# Patient Record
Sex: Female | Born: 1935 | Race: White | Hispanic: No | Smoking: Never smoker
Health system: Southern US, Community
[De-identification: ages and names within clinical notes are randomized; demographics above are authoritative.]

## PROBLEM LIST (undated history)

## (undated) DIAGNOSIS — F411 Generalized anxiety disorder: Secondary | ICD-10-CM

## (undated) DIAGNOSIS — R32 Unspecified urinary incontinence: Principal | ICD-10-CM

## (undated) DIAGNOSIS — F0280 Dementia in other diseases classified elsewhere without behavioral disturbance: Secondary | ICD-10-CM

## (undated) DIAGNOSIS — I4891 Unspecified atrial fibrillation: Secondary | ICD-10-CM

## (undated) DIAGNOSIS — M1990 Unspecified osteoarthritis, unspecified site: Secondary | ICD-10-CM

## (undated) DIAGNOSIS — M419 Scoliosis, unspecified: Secondary | ICD-10-CM

## (undated) DIAGNOSIS — I10 Essential (primary) hypertension: Secondary | ICD-10-CM

## (undated) DIAGNOSIS — K5900 Constipation, unspecified: Secondary | ICD-10-CM

## (undated) DIAGNOSIS — G20 Parkinson's disease: Secondary | ICD-10-CM

## (undated) DIAGNOSIS — G4700 Insomnia, unspecified: Secondary | ICD-10-CM

## (undated) DIAGNOSIS — E785 Hyperlipidemia, unspecified: Secondary | ICD-10-CM

## (undated) DIAGNOSIS — G20A1 Parkinson's disease without dyskinesia, without mention of fluctuations: Secondary | ICD-10-CM

## (undated) DIAGNOSIS — R413 Other amnesia: Secondary | ICD-10-CM

## (undated) DIAGNOSIS — S42309A Unspecified fracture of shaft of humerus, unspecified arm, initial encounter for closed fracture: Secondary | ICD-10-CM

## (undated) DIAGNOSIS — F419 Anxiety disorder, unspecified: Secondary | ICD-10-CM

## (undated) DIAGNOSIS — R5381 Other malaise: Secondary | ICD-10-CM

## (undated) DIAGNOSIS — S52599A Other fractures of lower end of unspecified radius, initial encounter for closed fracture: Secondary | ICD-10-CM

## (undated) DIAGNOSIS — Z8673 Personal history of transient ischemic attack (TIA), and cerebral infarction without residual deficits: Secondary | ICD-10-CM

## (undated) DIAGNOSIS — I359 Nonrheumatic aortic valve disorder, unspecified: Secondary | ICD-10-CM

## (undated) HISTORY — DX: Dementia in other diseases classified elsewhere without behavioral disturbance: F02.80

## (undated) HISTORY — DX: Anxiety disorder, unspecified: F41.9

## (undated) HISTORY — DX: Hyperlipidemia, unspecified: E78.5

## (undated) HISTORY — DX: Unspecified atrial fibrillation: I48.91

## (undated) HISTORY — DX: Insomnia, unspecified: G47.00

## (undated) HISTORY — DX: Unspecified osteoarthritis, unspecified site: M19.90

## (undated) HISTORY — PX: CHOLECYSTECTOMY: SHX55

## (undated) HISTORY — DX: Unspecified fracture of shaft of humerus, unspecified arm, initial encounter for closed fracture: S42.309A

## (undated) HISTORY — DX: Other malaise: R53.81

## (undated) HISTORY — DX: Nonrheumatic aortic valve disorder, unspecified: I35.9

## (undated) HISTORY — DX: Other amnesia: R41.3

## (undated) HISTORY — DX: Generalized anxiety disorder: F41.1

## (undated) HISTORY — DX: Constipation, unspecified: K59.00

## (undated) HISTORY — DX: Other fractures of lower end of unspecified radius, initial encounter for closed fracture: S52.599A

## (undated) HISTORY — DX: Personal history of transient ischemic attack (TIA), and cerebral infarction without residual deficits: Z86.73

## (undated) HISTORY — PX: BREAST SURGERY: SHX581

## (undated) HISTORY — DX: Unspecified urinary incontinence: R32

---

## 2008-08-08 ENCOUNTER — Emergency Department (HOSPITAL_COMMUNITY): Admission: EM | Admit: 2008-08-08 | Discharge: 2008-08-08 | Payer: Self-pay | Admitting: Emergency Medicine

## 2010-07-01 LAB — POCT I-STAT, CHEM 8
BUN: 9 mg/dL (ref 6–23)
Calcium, Ion: 1.1 mmol/L — ABNORMAL LOW (ref 1.12–1.32)
TCO2: 24 mmol/L (ref 0–100)

## 2010-07-01 LAB — CBC
HCT: 38.2 % (ref 36.0–46.0)
Hemoglobin: 13.3 g/dL (ref 12.0–15.0)
MCHC: 34.9 g/dL (ref 30.0–36.0)
MCV: 92.1 fL (ref 78.0–100.0)
RDW: 12.8 % (ref 11.5–15.5)

## 2010-07-01 LAB — URINALYSIS, ROUTINE W REFLEX MICROSCOPIC
Glucose, UA: NEGATIVE mg/dL
Ketones, ur: NEGATIVE mg/dL
Protein, ur: NEGATIVE mg/dL

## 2010-07-01 LAB — DIFFERENTIAL
Basophils Absolute: 0 10*3/uL (ref 0.0–0.1)
Basophils Relative: 1 % (ref 0–1)
Eosinophils Relative: 3 % (ref 0–5)
Lymphocytes Relative: 16 % (ref 12–46)
Monocytes Absolute: 0.2 10*3/uL (ref 0.1–1.0)

## 2011-03-26 DIAGNOSIS — F0280 Dementia in other diseases classified elsewhere without behavioral disturbance: Secondary | ICD-10-CM | POA: Diagnosis not present

## 2011-03-26 DIAGNOSIS — I509 Heart failure, unspecified: Secondary | ICD-10-CM | POA: Diagnosis not present

## 2011-03-26 DIAGNOSIS — R609 Edema, unspecified: Secondary | ICD-10-CM | POA: Diagnosis not present

## 2011-03-26 DIAGNOSIS — I4891 Unspecified atrial fibrillation: Secondary | ICD-10-CM | POA: Diagnosis not present

## 2011-03-26 DIAGNOSIS — I10 Essential (primary) hypertension: Secondary | ICD-10-CM | POA: Diagnosis not present

## 2011-03-26 DIAGNOSIS — IMO0002 Reserved for concepts with insufficient information to code with codable children: Secondary | ICD-10-CM | POA: Diagnosis not present

## 2011-03-27 DIAGNOSIS — Z79899 Other long term (current) drug therapy: Secondary | ICD-10-CM | POA: Diagnosis not present

## 2011-03-27 DIAGNOSIS — M5126 Other intervertebral disc displacement, lumbar region: Secondary | ICD-10-CM | POA: Diagnosis not present

## 2011-04-06 DIAGNOSIS — I10 Essential (primary) hypertension: Secondary | ICD-10-CM | POA: Diagnosis not present

## 2011-04-06 DIAGNOSIS — G030 Nonpyogenic meningitis: Secondary | ICD-10-CM | POA: Diagnosis not present

## 2011-04-07 DIAGNOSIS — M25579 Pain in unspecified ankle and joints of unspecified foot: Secondary | ICD-10-CM | POA: Diagnosis not present

## 2011-04-30 DIAGNOSIS — G4700 Insomnia, unspecified: Secondary | ICD-10-CM | POA: Diagnosis not present

## 2011-04-30 DIAGNOSIS — I10 Essential (primary) hypertension: Secondary | ICD-10-CM | POA: Diagnosis not present

## 2011-04-30 DIAGNOSIS — G20 Parkinson's disease: Secondary | ICD-10-CM | POA: Diagnosis not present

## 2011-04-30 DIAGNOSIS — M545 Low back pain: Secondary | ICD-10-CM | POA: Diagnosis not present

## 2011-04-30 DIAGNOSIS — I679 Cerebrovascular disease, unspecified: Secondary | ICD-10-CM | POA: Diagnosis not present

## 2011-05-01 DIAGNOSIS — Z5189 Encounter for other specified aftercare: Secondary | ICD-10-CM | POA: Diagnosis not present

## 2011-05-01 DIAGNOSIS — M545 Low back pain: Secondary | ICD-10-CM | POA: Diagnosis not present

## 2011-05-01 DIAGNOSIS — I10 Essential (primary) hypertension: Secondary | ICD-10-CM | POA: Diagnosis not present

## 2011-05-01 DIAGNOSIS — E7800 Pure hypercholesterolemia, unspecified: Secondary | ICD-10-CM | POA: Diagnosis not present

## 2011-05-01 DIAGNOSIS — G20 Parkinson's disease: Secondary | ICD-10-CM | POA: Diagnosis not present

## 2011-05-02 DIAGNOSIS — G20 Parkinson's disease: Secondary | ICD-10-CM | POA: Diagnosis not present

## 2011-05-02 DIAGNOSIS — M545 Low back pain: Secondary | ICD-10-CM | POA: Diagnosis not present

## 2011-05-02 DIAGNOSIS — I10 Essential (primary) hypertension: Secondary | ICD-10-CM | POA: Diagnosis not present

## 2011-05-02 DIAGNOSIS — E7800 Pure hypercholesterolemia, unspecified: Secondary | ICD-10-CM | POA: Diagnosis not present

## 2011-05-02 DIAGNOSIS — Z5189 Encounter for other specified aftercare: Secondary | ICD-10-CM | POA: Diagnosis not present

## 2011-05-04 DIAGNOSIS — G20 Parkinson's disease: Secondary | ICD-10-CM | POA: Diagnosis not present

## 2011-05-04 DIAGNOSIS — Z5189 Encounter for other specified aftercare: Secondary | ICD-10-CM | POA: Diagnosis not present

## 2011-05-04 DIAGNOSIS — I10 Essential (primary) hypertension: Secondary | ICD-10-CM | POA: Diagnosis not present

## 2011-05-04 DIAGNOSIS — M545 Low back pain: Secondary | ICD-10-CM | POA: Diagnosis not present

## 2011-05-04 DIAGNOSIS — E7800 Pure hypercholesterolemia, unspecified: Secondary | ICD-10-CM | POA: Diagnosis not present

## 2011-05-05 DIAGNOSIS — Z5189 Encounter for other specified aftercare: Secondary | ICD-10-CM | POA: Diagnosis not present

## 2011-05-05 DIAGNOSIS — E7800 Pure hypercholesterolemia, unspecified: Secondary | ICD-10-CM | POA: Diagnosis not present

## 2011-05-05 DIAGNOSIS — M545 Low back pain: Secondary | ICD-10-CM | POA: Diagnosis not present

## 2011-05-05 DIAGNOSIS — G20 Parkinson's disease: Secondary | ICD-10-CM | POA: Diagnosis not present

## 2011-05-05 DIAGNOSIS — I10 Essential (primary) hypertension: Secondary | ICD-10-CM | POA: Diagnosis not present

## 2011-05-06 DIAGNOSIS — I10 Essential (primary) hypertension: Secondary | ICD-10-CM | POA: Diagnosis not present

## 2011-05-06 DIAGNOSIS — M545 Low back pain: Secondary | ICD-10-CM | POA: Diagnosis not present

## 2011-05-06 DIAGNOSIS — G20 Parkinson's disease: Secondary | ICD-10-CM | POA: Diagnosis not present

## 2011-05-06 DIAGNOSIS — E7800 Pure hypercholesterolemia, unspecified: Secondary | ICD-10-CM | POA: Diagnosis not present

## 2011-05-06 DIAGNOSIS — Z5189 Encounter for other specified aftercare: Secondary | ICD-10-CM | POA: Diagnosis not present

## 2011-05-08 DIAGNOSIS — G20 Parkinson's disease: Secondary | ICD-10-CM | POA: Diagnosis not present

## 2011-05-08 DIAGNOSIS — Z5189 Encounter for other specified aftercare: Secondary | ICD-10-CM | POA: Diagnosis not present

## 2011-05-08 DIAGNOSIS — I10 Essential (primary) hypertension: Secondary | ICD-10-CM | POA: Diagnosis not present

## 2011-05-08 DIAGNOSIS — M545 Low back pain: Secondary | ICD-10-CM | POA: Diagnosis not present

## 2011-05-08 DIAGNOSIS — E7800 Pure hypercholesterolemia, unspecified: Secondary | ICD-10-CM | POA: Diagnosis not present

## 2011-05-11 DIAGNOSIS — I10 Essential (primary) hypertension: Secondary | ICD-10-CM | POA: Diagnosis not present

## 2011-05-11 DIAGNOSIS — M545 Low back pain: Secondary | ICD-10-CM | POA: Diagnosis not present

## 2011-05-11 DIAGNOSIS — G20 Parkinson's disease: Secondary | ICD-10-CM | POA: Diagnosis not present

## 2011-05-11 DIAGNOSIS — Z5189 Encounter for other specified aftercare: Secondary | ICD-10-CM | POA: Diagnosis not present

## 2011-05-11 DIAGNOSIS — E7800 Pure hypercholesterolemia, unspecified: Secondary | ICD-10-CM | POA: Diagnosis not present

## 2011-05-12 DIAGNOSIS — Z5189 Encounter for other specified aftercare: Secondary | ICD-10-CM | POA: Diagnosis not present

## 2011-05-12 DIAGNOSIS — E7800 Pure hypercholesterolemia, unspecified: Secondary | ICD-10-CM | POA: Diagnosis not present

## 2011-05-12 DIAGNOSIS — I10 Essential (primary) hypertension: Secondary | ICD-10-CM | POA: Diagnosis not present

## 2011-05-12 DIAGNOSIS — M545 Low back pain: Secondary | ICD-10-CM | POA: Diagnosis not present

## 2011-05-12 DIAGNOSIS — G20 Parkinson's disease: Secondary | ICD-10-CM | POA: Diagnosis not present

## 2011-05-14 DIAGNOSIS — G20 Parkinson's disease: Secondary | ICD-10-CM | POA: Diagnosis not present

## 2011-05-14 DIAGNOSIS — M545 Low back pain: Secondary | ICD-10-CM | POA: Diagnosis not present

## 2011-05-14 DIAGNOSIS — I10 Essential (primary) hypertension: Secondary | ICD-10-CM | POA: Diagnosis not present

## 2011-05-14 DIAGNOSIS — E7800 Pure hypercholesterolemia, unspecified: Secondary | ICD-10-CM | POA: Diagnosis not present

## 2011-05-14 DIAGNOSIS — Z5189 Encounter for other specified aftercare: Secondary | ICD-10-CM | POA: Diagnosis not present

## 2011-05-15 DIAGNOSIS — M2020 Hallux rigidus, unspecified foot: Secondary | ICD-10-CM | POA: Diagnosis not present

## 2011-05-15 DIAGNOSIS — G20 Parkinson's disease: Secondary | ICD-10-CM | POA: Diagnosis not present

## 2011-05-15 DIAGNOSIS — I10 Essential (primary) hypertension: Secondary | ICD-10-CM | POA: Diagnosis not present

## 2011-05-15 DIAGNOSIS — M545 Low back pain: Secondary | ICD-10-CM | POA: Diagnosis not present

## 2011-05-15 DIAGNOSIS — E7800 Pure hypercholesterolemia, unspecified: Secondary | ICD-10-CM | POA: Diagnosis not present

## 2011-05-15 DIAGNOSIS — Z5189 Encounter for other specified aftercare: Secondary | ICD-10-CM | POA: Diagnosis not present

## 2011-05-18 DIAGNOSIS — M545 Low back pain: Secondary | ICD-10-CM | POA: Diagnosis not present

## 2011-05-18 DIAGNOSIS — G20 Parkinson's disease: Secondary | ICD-10-CM | POA: Diagnosis not present

## 2011-05-18 DIAGNOSIS — I10 Essential (primary) hypertension: Secondary | ICD-10-CM | POA: Diagnosis not present

## 2011-05-18 DIAGNOSIS — E7800 Pure hypercholesterolemia, unspecified: Secondary | ICD-10-CM | POA: Diagnosis not present

## 2011-05-18 DIAGNOSIS — Z5189 Encounter for other specified aftercare: Secondary | ICD-10-CM | POA: Diagnosis not present

## 2011-05-20 DIAGNOSIS — E7800 Pure hypercholesterolemia, unspecified: Secondary | ICD-10-CM | POA: Diagnosis not present

## 2011-05-20 DIAGNOSIS — G20 Parkinson's disease: Secondary | ICD-10-CM | POA: Diagnosis not present

## 2011-05-20 DIAGNOSIS — M545 Low back pain: Secondary | ICD-10-CM | POA: Diagnosis not present

## 2011-05-20 DIAGNOSIS — Z5189 Encounter for other specified aftercare: Secondary | ICD-10-CM | POA: Diagnosis not present

## 2011-05-20 DIAGNOSIS — I10 Essential (primary) hypertension: Secondary | ICD-10-CM | POA: Diagnosis not present

## 2011-05-21 DIAGNOSIS — G20 Parkinson's disease: Secondary | ICD-10-CM | POA: Diagnosis not present

## 2011-05-21 DIAGNOSIS — I10 Essential (primary) hypertension: Secondary | ICD-10-CM | POA: Diagnosis not present

## 2011-05-21 DIAGNOSIS — E7800 Pure hypercholesterolemia, unspecified: Secondary | ICD-10-CM | POA: Diagnosis not present

## 2011-05-21 DIAGNOSIS — Z5189 Encounter for other specified aftercare: Secondary | ICD-10-CM | POA: Diagnosis not present

## 2011-05-21 DIAGNOSIS — M545 Low back pain: Secondary | ICD-10-CM | POA: Diagnosis not present

## 2011-06-04 DIAGNOSIS — R609 Edema, unspecified: Secondary | ICD-10-CM | POA: Diagnosis not present

## 2011-06-04 DIAGNOSIS — G4700 Insomnia, unspecified: Secondary | ICD-10-CM | POA: Diagnosis not present

## 2011-06-04 DIAGNOSIS — G4730 Sleep apnea, unspecified: Secondary | ICD-10-CM | POA: Diagnosis not present

## 2011-06-04 DIAGNOSIS — G20 Parkinson's disease: Secondary | ICD-10-CM | POA: Diagnosis not present

## 2011-06-08 DIAGNOSIS — R079 Chest pain, unspecified: Secondary | ICD-10-CM | POA: Diagnosis not present

## 2011-06-08 DIAGNOSIS — I6529 Occlusion and stenosis of unspecified carotid artery: Secondary | ICD-10-CM | POA: Diagnosis not present

## 2011-06-08 DIAGNOSIS — IMO0002 Reserved for concepts with insufficient information to code with codable children: Secondary | ICD-10-CM | POA: Diagnosis not present

## 2011-06-08 DIAGNOSIS — R002 Palpitations: Secondary | ICD-10-CM | POA: Diagnosis not present

## 2011-06-15 DIAGNOSIS — R002 Palpitations: Secondary | ICD-10-CM | POA: Diagnosis not present

## 2011-06-16 DIAGNOSIS — I359 Nonrheumatic aortic valve disorder, unspecified: Secondary | ICD-10-CM | POA: Diagnosis not present

## 2011-06-18 DIAGNOSIS — D492 Neoplasm of unspecified behavior of bone, soft tissue, and skin: Secondary | ICD-10-CM | POA: Diagnosis not present

## 2011-06-30 DIAGNOSIS — D492 Neoplasm of unspecified behavior of bone, soft tissue, and skin: Secondary | ICD-10-CM | POA: Diagnosis not present

## 2011-07-03 DIAGNOSIS — Z79899 Other long term (current) drug therapy: Secondary | ICD-10-CM | POA: Diagnosis not present

## 2011-07-03 DIAGNOSIS — R002 Palpitations: Secondary | ICD-10-CM | POA: Diagnosis not present

## 2011-07-16 DIAGNOSIS — D492 Neoplasm of unspecified behavior of bone, soft tissue, and skin: Secondary | ICD-10-CM | POA: Diagnosis not present

## 2011-08-13 DIAGNOSIS — G20 Parkinson's disease: Secondary | ICD-10-CM | POA: Diagnosis not present

## 2011-08-13 DIAGNOSIS — M545 Low back pain: Secondary | ICD-10-CM | POA: Diagnosis not present

## 2011-08-13 DIAGNOSIS — G4700 Insomnia, unspecified: Secondary | ICD-10-CM | POA: Diagnosis not present

## 2011-08-18 DIAGNOSIS — G3184 Mild cognitive impairment, so stated: Secondary | ICD-10-CM | POA: Diagnosis not present

## 2011-08-18 DIAGNOSIS — I10 Essential (primary) hypertension: Secondary | ICD-10-CM | POA: Diagnosis not present

## 2011-08-18 DIAGNOSIS — G20 Parkinson's disease: Secondary | ICD-10-CM | POA: Diagnosis not present

## 2011-08-31 DIAGNOSIS — D492 Neoplasm of unspecified behavior of bone, soft tissue, and skin: Secondary | ICD-10-CM | POA: Diagnosis not present

## 2011-09-22 DIAGNOSIS — I10 Essential (primary) hypertension: Secondary | ICD-10-CM | POA: Diagnosis not present

## 2011-09-22 DIAGNOSIS — G3184 Mild cognitive impairment, so stated: Secondary | ICD-10-CM | POA: Diagnosis not present

## 2011-09-22 DIAGNOSIS — G20 Parkinson's disease: Secondary | ICD-10-CM | POA: Diagnosis not present

## 2011-09-25 DIAGNOSIS — G3184 Mild cognitive impairment, so stated: Secondary | ICD-10-CM | POA: Diagnosis not present

## 2011-09-25 DIAGNOSIS — E559 Vitamin D deficiency, unspecified: Secondary | ICD-10-CM | POA: Diagnosis not present

## 2011-09-25 DIAGNOSIS — R945 Abnormal results of liver function studies: Secondary | ICD-10-CM | POA: Diagnosis not present

## 2011-09-25 DIAGNOSIS — G20 Parkinson's disease: Secondary | ICD-10-CM | POA: Diagnosis not present

## 2011-09-25 DIAGNOSIS — R7309 Other abnormal glucose: Secondary | ICD-10-CM | POA: Diagnosis not present

## 2011-09-25 DIAGNOSIS — I10 Essential (primary) hypertension: Secondary | ICD-10-CM | POA: Diagnosis not present

## 2011-10-01 DIAGNOSIS — Z961 Presence of intraocular lens: Secondary | ICD-10-CM | POA: Diagnosis not present

## 2011-10-01 DIAGNOSIS — H02839 Dermatochalasis of unspecified eye, unspecified eyelid: Secondary | ICD-10-CM | POA: Diagnosis not present

## 2011-10-01 DIAGNOSIS — H43819 Vitreous degeneration, unspecified eye: Secondary | ICD-10-CM | POA: Diagnosis not present

## 2011-10-01 DIAGNOSIS — H26499 Other secondary cataract, unspecified eye: Secondary | ICD-10-CM | POA: Diagnosis not present

## 2011-10-19 DIAGNOSIS — G20 Parkinson's disease: Secondary | ICD-10-CM | POA: Diagnosis not present

## 2011-10-19 DIAGNOSIS — G3184 Mild cognitive impairment, so stated: Secondary | ICD-10-CM | POA: Diagnosis not present

## 2011-10-21 DIAGNOSIS — G20 Parkinson's disease: Secondary | ICD-10-CM | POA: Diagnosis not present

## 2011-10-21 DIAGNOSIS — G3184 Mild cognitive impairment, so stated: Secondary | ICD-10-CM | POA: Diagnosis not present

## 2011-10-21 DIAGNOSIS — I10 Essential (primary) hypertension: Secondary | ICD-10-CM | POA: Diagnosis not present

## 2011-11-04 DIAGNOSIS — D492 Neoplasm of unspecified behavior of bone, soft tissue, and skin: Secondary | ICD-10-CM | POA: Diagnosis not present

## 2011-12-08 DIAGNOSIS — G3184 Mild cognitive impairment, so stated: Secondary | ICD-10-CM | POA: Diagnosis not present

## 2011-12-08 DIAGNOSIS — G20 Parkinson's disease: Secondary | ICD-10-CM | POA: Diagnosis not present

## 2011-12-08 DIAGNOSIS — R5382 Chronic fatigue, unspecified: Secondary | ICD-10-CM | POA: Diagnosis not present

## 2011-12-10 DIAGNOSIS — R002 Palpitations: Secondary | ICD-10-CM | POA: Diagnosis not present

## 2011-12-10 DIAGNOSIS — IMO0002 Reserved for concepts with insufficient information to code with codable children: Secondary | ICD-10-CM | POA: Diagnosis not present

## 2011-12-10 DIAGNOSIS — I6529 Occlusion and stenosis of unspecified carotid artery: Secondary | ICD-10-CM | POA: Diagnosis not present

## 2011-12-10 DIAGNOSIS — R079 Chest pain, unspecified: Secondary | ICD-10-CM | POA: Diagnosis not present

## 2011-12-11 DIAGNOSIS — G20 Parkinson's disease: Secondary | ICD-10-CM | POA: Diagnosis not present

## 2011-12-11 DIAGNOSIS — I10 Essential (primary) hypertension: Secondary | ICD-10-CM | POA: Diagnosis not present

## 2011-12-11 DIAGNOSIS — E0500 Thyrotoxicosis with diffuse goiter without thyrotoxic crisis or storm: Secondary | ICD-10-CM | POA: Diagnosis not present

## 2011-12-14 DIAGNOSIS — M545 Low back pain: Secondary | ICD-10-CM | POA: Diagnosis not present

## 2011-12-23 DIAGNOSIS — Z79899 Other long term (current) drug therapy: Secondary | ICD-10-CM | POA: Diagnosis not present

## 2011-12-23 DIAGNOSIS — IMO0002 Reserved for concepts with insufficient information to code with codable children: Secondary | ICD-10-CM | POA: Diagnosis not present

## 2011-12-23 DIAGNOSIS — I6529 Occlusion and stenosis of unspecified carotid artery: Secondary | ICD-10-CM | POA: Diagnosis not present

## 2011-12-23 DIAGNOSIS — R42 Dizziness and giddiness: Secondary | ICD-10-CM | POA: Diagnosis not present

## 2011-12-23 DIAGNOSIS — Z23 Encounter for immunization: Secondary | ICD-10-CM | POA: Diagnosis not present

## 2011-12-23 DIAGNOSIS — R002 Palpitations: Secondary | ICD-10-CM | POA: Diagnosis not present

## 2011-12-23 DIAGNOSIS — R079 Chest pain, unspecified: Secondary | ICD-10-CM | POA: Diagnosis not present

## 2011-12-29 DIAGNOSIS — I779 Disorder of arteries and arterioles, unspecified: Secondary | ICD-10-CM | POA: Diagnosis not present

## 2011-12-29 DIAGNOSIS — R911 Solitary pulmonary nodule: Secondary | ICD-10-CM | POA: Diagnosis not present

## 2011-12-29 DIAGNOSIS — F0390 Unspecified dementia without behavioral disturbance: Secondary | ICD-10-CM | POA: Diagnosis not present

## 2011-12-29 DIAGNOSIS — G20 Parkinson's disease: Secondary | ICD-10-CM | POA: Diagnosis not present

## 2011-12-29 DIAGNOSIS — R4182 Altered mental status, unspecified: Secondary | ICD-10-CM | POA: Diagnosis not present

## 2011-12-29 DIAGNOSIS — F411 Generalized anxiety disorder: Secondary | ICD-10-CM | POA: Diagnosis not present

## 2011-12-29 DIAGNOSIS — E785 Hyperlipidemia, unspecified: Secondary | ICD-10-CM | POA: Diagnosis not present

## 2011-12-29 DIAGNOSIS — R0609 Other forms of dyspnea: Secondary | ICD-10-CM | POA: Diagnosis not present

## 2011-12-29 DIAGNOSIS — R5383 Other fatigue: Secondary | ICD-10-CM | POA: Diagnosis not present

## 2011-12-29 DIAGNOSIS — R079 Chest pain, unspecified: Secondary | ICD-10-CM | POA: Diagnosis not present

## 2011-12-29 DIAGNOSIS — I10 Essential (primary) hypertension: Secondary | ICD-10-CM | POA: Diagnosis not present

## 2011-12-29 DIAGNOSIS — R0789 Other chest pain: Secondary | ICD-10-CM | POA: Diagnosis not present

## 2011-12-29 DIAGNOSIS — R259 Unspecified abnormal involuntary movements: Secondary | ICD-10-CM | POA: Diagnosis not present

## 2011-12-29 DIAGNOSIS — G4700 Insomnia, unspecified: Secondary | ICD-10-CM | POA: Diagnosis not present

## 2011-12-29 DIAGNOSIS — R0989 Other specified symptoms and signs involving the circulatory and respiratory systems: Secondary | ICD-10-CM | POA: Diagnosis not present

## 2011-12-29 DIAGNOSIS — R4789 Other speech disturbances: Secondary | ICD-10-CM | POA: Diagnosis not present

## 2011-12-29 DIAGNOSIS — R002 Palpitations: Secondary | ICD-10-CM | POA: Diagnosis not present

## 2011-12-29 DIAGNOSIS — R51 Headache: Secondary | ICD-10-CM | POA: Diagnosis not present

## 2011-12-29 DIAGNOSIS — M129 Arthropathy, unspecified: Secondary | ICD-10-CM | POA: Diagnosis not present

## 2011-12-29 DIAGNOSIS — I2510 Atherosclerotic heart disease of native coronary artery without angina pectoris: Secondary | ICD-10-CM | POA: Diagnosis not present

## 2011-12-29 DIAGNOSIS — I959 Hypotension, unspecified: Secondary | ICD-10-CM | POA: Diagnosis not present

## 2011-12-29 DIAGNOSIS — R52 Pain, unspecified: Secondary | ICD-10-CM | POA: Diagnosis not present

## 2011-12-30 DIAGNOSIS — R079 Chest pain, unspecified: Secondary | ICD-10-CM | POA: Diagnosis not present

## 2011-12-30 DIAGNOSIS — R05 Cough: Secondary | ICD-10-CM | POA: Diagnosis not present

## 2011-12-30 DIAGNOSIS — G319 Degenerative disease of nervous system, unspecified: Secondary | ICD-10-CM | POA: Diagnosis not present

## 2011-12-30 DIAGNOSIS — R002 Palpitations: Secondary | ICD-10-CM | POA: Diagnosis not present

## 2011-12-30 DIAGNOSIS — G9389 Other specified disorders of brain: Secondary | ICD-10-CM | POA: Diagnosis not present

## 2011-12-30 DIAGNOSIS — I10 Essential (primary) hypertension: Secondary | ICD-10-CM | POA: Diagnosis not present

## 2011-12-30 DIAGNOSIS — I6789 Other cerebrovascular disease: Secondary | ICD-10-CM | POA: Diagnosis not present

## 2011-12-31 DIAGNOSIS — R079 Chest pain, unspecified: Secondary | ICD-10-CM | POA: Diagnosis not present

## 2011-12-31 DIAGNOSIS — I10 Essential (primary) hypertension: Secondary | ICD-10-CM | POA: Diagnosis not present

## 2011-12-31 DIAGNOSIS — G20 Parkinson's disease: Secondary | ICD-10-CM | POA: Diagnosis not present

## 2012-01-05 DIAGNOSIS — G3184 Mild cognitive impairment, so stated: Secondary | ICD-10-CM | POA: Diagnosis not present

## 2012-01-05 DIAGNOSIS — I10 Essential (primary) hypertension: Secondary | ICD-10-CM | POA: Diagnosis not present

## 2012-01-05 DIAGNOSIS — G20 Parkinson's disease: Secondary | ICD-10-CM | POA: Diagnosis not present

## 2012-01-06 DIAGNOSIS — D492 Neoplasm of unspecified behavior of bone, soft tissue, and skin: Secondary | ICD-10-CM | POA: Diagnosis not present

## 2012-01-21 DIAGNOSIS — D492 Neoplasm of unspecified behavior of bone, soft tissue, and skin: Secondary | ICD-10-CM | POA: Diagnosis not present

## 2012-01-21 DIAGNOSIS — D0439 Carcinoma in situ of skin of other parts of face: Secondary | ICD-10-CM | POA: Diagnosis not present

## 2012-01-26 DIAGNOSIS — R002 Palpitations: Secondary | ICD-10-CM | POA: Diagnosis not present

## 2012-01-26 DIAGNOSIS — R079 Chest pain, unspecified: Secondary | ICD-10-CM | POA: Diagnosis not present

## 2012-01-26 DIAGNOSIS — IMO0002 Reserved for concepts with insufficient information to code with codable children: Secondary | ICD-10-CM | POA: Diagnosis not present

## 2012-01-26 DIAGNOSIS — I6529 Occlusion and stenosis of unspecified carotid artery: Secondary | ICD-10-CM | POA: Diagnosis not present

## 2012-02-08 DIAGNOSIS — I10 Essential (primary) hypertension: Secondary | ICD-10-CM | POA: Diagnosis not present

## 2012-02-08 DIAGNOSIS — IMO0001 Reserved for inherently not codable concepts without codable children: Secondary | ICD-10-CM | POA: Diagnosis not present

## 2012-02-08 DIAGNOSIS — G311 Senile degeneration of brain, not elsewhere classified: Secondary | ICD-10-CM | POA: Diagnosis not present

## 2012-03-08 DIAGNOSIS — G3184 Mild cognitive impairment, so stated: Secondary | ICD-10-CM | POA: Diagnosis not present

## 2012-03-08 DIAGNOSIS — G20 Parkinson's disease: Secondary | ICD-10-CM | POA: Diagnosis not present

## 2012-03-09 DIAGNOSIS — F0280 Dementia in other diseases classified elsewhere without behavioral disturbance: Secondary | ICD-10-CM | POA: Insufficient documentation

## 2012-03-09 HISTORY — DX: Dementia in other diseases classified elsewhere, unspecified severity, without behavioral disturbance, psychotic disturbance, mood disturbance, and anxiety: F02.80

## 2012-03-14 ENCOUNTER — Emergency Department (HOSPITAL_COMMUNITY): Payer: Medicare Other

## 2012-03-14 ENCOUNTER — Observation Stay (HOSPITAL_COMMUNITY)
Admission: EM | Admit: 2012-03-14 | Discharge: 2012-03-15 | Disposition: A | Discharge status: Home / Self Care | Payer: Medicare Other | Attending: Internal Medicine | Admitting: Internal Medicine

## 2012-03-14 ENCOUNTER — Encounter (HOSPITAL_COMMUNITY): Payer: Self-pay | Admitting: Emergency Medicine

## 2012-03-14 DIAGNOSIS — S52309A Unspecified fracture of shaft of unspecified radius, initial encounter for closed fracture: Secondary | ICD-10-CM | POA: Diagnosis not present

## 2012-03-14 DIAGNOSIS — I69922 Dysarthria following unspecified cerebrovascular disease: Secondary | ICD-10-CM | POA: Diagnosis not present

## 2012-03-14 DIAGNOSIS — G20 Parkinson's disease: Secondary | ICD-10-CM | POA: Diagnosis not present

## 2012-03-14 DIAGNOSIS — E785 Hyperlipidemia, unspecified: Secondary | ICD-10-CM | POA: Diagnosis not present

## 2012-03-14 DIAGNOSIS — S52599A Other fractures of lower end of unspecified radius, initial encounter for closed fracture: Secondary | ICD-10-CM | POA: Diagnosis not present

## 2012-03-14 DIAGNOSIS — S52502A Unspecified fracture of the lower end of left radius, initial encounter for closed fracture: Secondary | ICD-10-CM | POA: Diagnosis present

## 2012-03-14 DIAGNOSIS — S4980XA Other specified injuries of shoulder and upper arm, unspecified arm, initial encounter: Secondary | ICD-10-CM | POA: Diagnosis not present

## 2012-03-14 DIAGNOSIS — W108XXA Fall (on) (from) other stairs and steps, initial encounter: Secondary | ICD-10-CM | POA: Insufficient documentation

## 2012-03-14 DIAGNOSIS — S0990XA Unspecified injury of head, initial encounter: Secondary | ICD-10-CM | POA: Diagnosis not present

## 2012-03-14 DIAGNOSIS — S42309A Unspecified fracture of shaft of humerus, unspecified arm, initial encounter for closed fracture: Secondary | ICD-10-CM | POA: Diagnosis not present

## 2012-03-14 DIAGNOSIS — R52 Pain, unspecified: Secondary | ICD-10-CM | POA: Diagnosis not present

## 2012-03-14 DIAGNOSIS — IMO0002 Reserved for concepts with insufficient information to code with codable children: Secondary | ICD-10-CM | POA: Diagnosis not present

## 2012-03-14 DIAGNOSIS — G20A1 Parkinson's disease without dyskinesia, without mention of fluctuations: Secondary | ICD-10-CM | POA: Insufficient documentation

## 2012-03-14 DIAGNOSIS — Z79899 Other long term (current) drug therapy: Secondary | ICD-10-CM | POA: Diagnosis not present

## 2012-03-14 DIAGNOSIS — S42209A Unspecified fracture of upper end of unspecified humerus, initial encounter for closed fracture: Secondary | ICD-10-CM | POA: Diagnosis not present

## 2012-03-14 DIAGNOSIS — M25519 Pain in unspecified shoulder: Secondary | ICD-10-CM | POA: Diagnosis not present

## 2012-03-14 DIAGNOSIS — I10 Essential (primary) hypertension: Secondary | ICD-10-CM | POA: Diagnosis not present

## 2012-03-14 DIAGNOSIS — S42213A Unspecified displaced fracture of surgical neck of unspecified humerus, initial encounter for closed fracture: Secondary | ICD-10-CM | POA: Diagnosis not present

## 2012-03-14 DIAGNOSIS — T148XXA Other injury of unspecified body region, initial encounter: Secondary | ICD-10-CM | POA: Diagnosis not present

## 2012-03-14 HISTORY — DX: Parkinson's disease: G20

## 2012-03-14 HISTORY — DX: Scoliosis, unspecified: M41.9

## 2012-03-14 HISTORY — DX: Parkinson's disease without dyskinesia, without mention of fluctuations: G20.A1

## 2012-03-14 HISTORY — DX: Unspecified osteoarthritis, unspecified site: M19.90

## 2012-03-14 HISTORY — DX: Essential (primary) hypertension: I10

## 2012-03-14 LAB — BASIC METABOLIC PANEL
Calcium: 8.7 mg/dL (ref 8.4–10.5)
Creatinine, Ser: 0.46 mg/dL — ABNORMAL LOW (ref 0.50–1.10)
GFR calc Af Amer: >90 mL/min (ref >90)

## 2012-03-14 LAB — CBC WITH DIFFERENTIAL/PLATELET
Basophils Absolute: 0 10*3/uL (ref 0.0–0.1)
Basophils Relative: 0 % (ref 0–1)
Eosinophils Absolute: 0.1 10*3/uL (ref 0.0–0.7)
Eosinophils Relative: 2 % (ref 0–5)
HCT: 37.4 % (ref 36.0–46.0)
MCH: 31.5 pg (ref 26.0–34.0)
MCHC: 34.2 g/dL (ref 30.0–36.0)
MCV: 92.1 fL (ref 78.0–100.0)
Monocytes Absolute: 0.5 10*3/uL (ref 0.1–1.0)
Neutro Abs: 4.8 10*3/uL (ref 1.7–7.7)
RDW: 12.2 % (ref 11.5–15.5)

## 2012-03-14 MED ORDER — CARBIDOPA-LEVODOPA 25-100 MG PO TABS
1.0000 | ORAL_TABLET | Freq: Two times a day (BID) | ORAL | Status: DC
  Administered 2012-03-15: 1 via ORAL
  Filled 2012-03-14 (×3): qty 1

## 2012-03-14 MED ORDER — ESCITALOPRAM OXALATE 5 MG PO TABS
5.0000 mg | ORAL_TABLET | Freq: Every morning | ORAL | Status: DC
  Administered 2012-03-15: 5 mg via ORAL
  Filled 2012-03-14: qty 1

## 2012-03-14 MED ORDER — LIDOCAINE HCL 1 % IJ SOLN
INTRAMUSCULAR | Status: AC
  Filled 2012-03-14: qty 20

## 2012-03-14 MED ORDER — OXYCODONE HCL 5 MG PO TABS
5.0000 mg | ORAL_TABLET | ORAL | Status: DC | PRN
  Administered 2012-03-15: 5 mg via ORAL
  Filled 2012-03-14: qty 1

## 2012-03-14 MED ORDER — AMLODIPINE BESYLATE 2.5 MG PO TABS
2.5000 mg | ORAL_TABLET | Freq: Every day | ORAL | Status: DC
  Administered 2012-03-14: 2.5 mg via ORAL
  Filled 2012-03-14 (×2): qty 1

## 2012-03-14 MED ORDER — METOPROLOL SUCCINATE 12.5 MG HALF TABLET
12.5000 mg | ORAL_TABLET | Freq: Every day | ORAL | Status: DC
  Filled 2012-03-14 (×2): qty 1

## 2012-03-14 MED ORDER — L-METHYLFOLATE-B12-B6-B2 6-1-50-5 MG PO TABS: 1.0000 | ORAL_TABLET | Freq: Every day | ORAL | Status: DC

## 2012-03-14 MED ORDER — DOCUSATE SODIUM 100 MG PO CAPS
100.0000 mg | ORAL_CAPSULE | Freq: Two times a day (BID) | ORAL | Status: DC
  Administered 2012-03-15: 100 mg via ORAL
  Filled 2012-03-14 (×3): qty 1

## 2012-03-14 MED ORDER — ASPIRIN 81 MG PO CHEW
81.0000 mg | CHEWABLE_TABLET | Freq: Every day | ORAL | Status: DC
  Administered 2012-03-15: 81 mg via ORAL
  Filled 2012-03-14: qty 1

## 2012-03-14 MED ORDER — HYDROMORPHONE HCL PF 1 MG/ML IJ SOLN
1.0000 mg | INTRAMUSCULAR | Status: DC | PRN
  Administered 2012-03-14 – 2012-03-15 (×5): 1 mg via INTRAVENOUS
  Filled 2012-03-14 (×5): qty 1

## 2012-03-14 MED ORDER — ENOXAPARIN SODIUM 40 MG/0.4ML ~~LOC~~ SOLN
40.0000 mg | Freq: Every day | SUBCUTANEOUS | Status: DC
  Filled 2012-03-14 (×2): qty 0.4

## 2012-03-14 MED ORDER — HYDROMORPHONE HCL PF 1 MG/ML IJ SOLN
0.5000 mg | Freq: Once | INTRAMUSCULAR | Status: AC
  Administered 2012-03-14: 0.5 mg via INTRAVENOUS
  Filled 2012-03-14: qty 1

## 2012-03-14 MED ORDER — ADULT MULTIVITAMIN W/MINERALS CH
1.0000 | ORAL_TABLET | Freq: Every day | ORAL | Status: DC
  Filled 2012-03-14 (×2): qty 1

## 2012-03-14 MED ORDER — HYDROMORPHONE HCL PF 1 MG/ML IJ SOLN
1.0000 mg | Freq: Once | INTRAMUSCULAR | Status: AC
  Administered 2012-03-14: 1 mg via INTRAVENOUS
  Filled 2012-03-14: qty 1

## 2012-03-14 MED ORDER — L-METHYLFOLATE-B6-B12 3-35-2 MG PO TABS
1.0000 | ORAL_TABLET | Freq: Every day | ORAL | Status: DC
  Administered 2012-03-15: 1 via ORAL
  Filled 2012-03-14: qty 1

## 2012-03-14 MED ORDER — MELATONIN 10 MG PO TABS: 1.0000 | ORAL_TABLET | Freq: Every evening | ORAL | Status: DC | PRN

## 2012-03-14 MED ORDER — SODIUM CHLORIDE 0.9 % IV SOLN
Freq: Once | INTRAVENOUS | Status: AC
  Administered 2012-03-14: 19:00:00 via INTRAVENOUS

## 2012-03-14 MED ORDER — ACETAMINOPHEN 650 MG RE SUPP: 650.0000 mg | Freq: Four times a day (QID) | RECTAL | Status: DC | PRN

## 2012-03-14 MED ORDER — ACETAMINOPHEN 325 MG PO TABS: 650.0000 mg | ORAL_TABLET | Freq: Four times a day (QID) | ORAL | Status: DC | PRN

## 2012-03-14 MED ORDER — ASPIRIN 81 MG PO TABS: 81.0000 mg | ORAL_TABLET | Freq: Every day | ORAL | Status: DC

## 2012-03-14 MED ORDER — ONDANSETRON HCL 4 MG/2ML IJ SOLN: 4.0000 mg | Freq: Four times a day (QID) | INTRAMUSCULAR | Status: DC | PRN

## 2012-03-14 MED ORDER — SENNA 8.6 MG PO TABS
1.0000 | ORAL_TABLET | Freq: Two times a day (BID) | ORAL | Status: DC
  Administered 2012-03-15: 8.6 mg via ORAL
  Filled 2012-03-14: qty 1

## 2012-03-14 MED ORDER — SIMVASTATIN 10 MG PO TABS
10.0000 mg | ORAL_TABLET | Freq: Every day | ORAL | Status: DC
Start: 2012-03-14 — End: 2012-03-15
  Filled 2012-03-14 (×2): qty 1

## 2012-03-14 MED ORDER — ONDANSETRON HCL 4 MG PO TABS: 4.0000 mg | ORAL_TABLET | Freq: Four times a day (QID) | ORAL | Status: DC | PRN

## 2012-03-14 NOTE — ED Notes (Signed)
Patient fell on steps today, was caught so did not hit head but fell on left shoulder.  EMS gave of Fentanyl in route.  Patient still rating pain as a 10.  Swelling visible to left shoulder.

## 2012-03-14 NOTE — ED Notes (Signed)
Even though patient is rating pain a 9, she appears to be resting comfortable.  No grimacing or outward signs of pain rated as a 9.

## 2012-03-14 NOTE — Consult Note (Signed)
ORTHOPAEDIC CONSULTATION  REQUESTING PHYSICIAN: Derwood Kaplan, MD  Chief Complaint: left UE injury  HPI: Vanessa Mcbride is a 78 y.o. female who complains of  Pain in the left shoulder and wrist following a fall at her daughter's home earlier today. She has been evaluated in the ED at [facility], xrays obtained, and I have been consulted to provide care for her distal radius fx.  Dr. Lovey Newcomer has been contacted regarding the proximal humerus fx. She has Parkinson's and a h/o mini-strokes  Past Medical History  Diagnosis Date  . Parkinson's disease   . Stroke    No past surgical history on file. History   Social History  . Marital Status: Married    Spouse Name: N/A    Number of Children: N/A  . Years of Education: N/A   Social History Main Topics  . Smoking status: Not on file  . Smokeless tobacco: Not on file  . Alcohol Use:   . Drug Use:   . Sexually Active:    Other Topics Concern  . Not on file   Social History Narrative  . No narrative on file   No family history on file. Allergies  Allergen Reactions  . Iodine Anaphylaxis  . Iohexol      Desc: pt has had contrast in the past (years ago) and it causea anaphylaxis- per pt's daughter.  stephanie davis,rtrct, Onset Date: [id number]    Prior to Admission medications   Medication Sig Start Date End Date Taking? Authorizing Provider  amLODipine (NORVASC) 5 MG tablet Take 2.5 mg by mouth at bedtime.    Yes Historical Provider, MD  aspirin 81 MG tablet Take 81 mg by mouth daily.   Yes Historical Provider, MD  carbidopa-levodopa (SINEMET IR) 25-100 MG per tablet Take 1 tablet by mouth 2 (two) times daily.    Yes Historical Provider, MD  escitalopram (LEXAPRO) 5 MG tablet Take 5 mg by mouth every morning.   Yes Historical Provider, MD  l-methylfolate-b2-b6-b12 (CEREFOLIN) 08-21-48-5 MG TABS Take 1 tablet by mouth daily.   Yes Historical Provider, MD  Melatonin 10 MG TABS Take 1 tablet by mouth at bedtime as needed. sleep   Yes  Historical Provider, MD  metoprolol succinate (TOPROL-XL) 25 MG 24 hr tablet Take 12.5 mg by mouth at bedtime.   Yes Historical Provider, MD  Multiple Vitamin (MULTIVITAMIN WITH MINERALS) TABS Take 1 tablet by mouth at bedtime.   Yes Historical Provider, MD  simvastatin (ZOCOR) 10 MG tablet Take 10 mg by mouth at bedtime.   Yes Historical Provider, MD  traMADol (ULTRAM) 50 MG tablet Take 50 mg by mouth 2 (two) times daily. pain   Yes Historical Provider, MD   Dg Pelvis 1-2 Views  03/14/2012  *RADIOLOGY REPORT*  Clinical Data: Fall  PELVIS - 1-2 VIEW  Comparison: None.  Findings: No fracture or dislocation is seen.  Visualized bony pelvis appears intact.  Mild degenerative changes of the bilateral hips and lower lumbar spine.  IMPRESSION: No fracture or dislocation is seen.   Original Report Authenticated By: Charline Bills, M.D.    Dg Wrist Complete Left  03/14/2012  *RADIOLOGY REPORT*  Clinical Data: Fall, left wrist pain  LEFT WRIST - COMPLETE 3+ VIEW  Comparison: None.  Findings: Transverse distal radial fracture with apex volar angulation.  Old nonunited ulnar styloid fracture.  Associated soft tissue swelling.  IMPRESSION: Transverse distal radial fracture with apex volar angulation.   Original Report Authenticated By: Charline Bills, M.D.  Ct Head Wo Contrast  03/14/2012  *RADIOLOGY REPORT*  Clinical Data: Fall,  did not hit head.  CT HEAD WITHOUT CONTRAST  Technique:  Contiguous axial images were obtained from the base of the skull through the vertex without contrast.  Comparison: None.  Findings: The patient had difficulty remaining motionless for the study.  Images are suboptimal.  Small or subtle lesions could be overlooked.  There is no evidence for acute infarction, intracranial hemorrhage, mass lesion, hydrocephalus, or extra-axial fluid. Moderate atrophy is present.  There is chronic microvascular ischemic change in the periventricular and subcortical white matter.  The calvarium  is intact.  There is mild chronic sinus disease in the ethmoid and maxillary regions.  IMPRESSION: Atrophy and chronic microvascular ischemic change.  No acute intracranial abnormality.   Original Report Authenticated By: Davonna Belling, M.D.    Dg Shoulder Left  03/14/2012  *RADIOLOGY REPORT*  Clinical Data: Fall, left shoulder pain  LEFT SHOULDER - 2+ VIEW  Comparison: None.  Findings: Suspected three-part proximal humerus fracture with transverse surgical neck component and mildly displaced greater tuberosity fragment.  Visualized left lung is clear.  IMPRESSION: Suspected three-part proximal humerus fracture, as described above.   Original Report Authenticated By: Charline Bills, M.D.    Dg Humerus Left  03/14/2012  *RADIOLOGY REPORT*  Clinical Data: Fall, left shoulder pain  LEFT HUMERUS - 2+ VIEW  Comparison: None.  Findings: Suspected three-part proximal humeral fracture with transverse surgical neck component and mildly displaced greater tuberosity fragment.  No distal humeral fracture is seen.  Visualized left lung is clear.  IMPRESSION: Suspected three-part proximal humerus fracture, as described above.   Original Report Authenticated By: Charline Bills, M.D.     Positive ROS: All other systems have been reviewed and were otherwise negative with the exception of those mentioned in the HPI and as above.  Physical Exam: Vitals: Refer to EMR. Constitutional:  WD, WN, NAD HEENT:  NCAT, EOMI Neuro/Psych:  Alert & oriented to person, place, and time; appropriate mood & affect Lymphatic: No generalized UE edema or lymphadenopathy Extremities / MSK:  The extremities are normal with respect to appearance, ranges of motion, joint stability, muscle strength/tone, sensation, & perfusion except as otherwise noted:   Left shoulder swollen, pain with movement.  Left wrist visibly deformed, dorsally tilted.  NVI  Assessment: L min displaced proximal humerus fx & dorsally angulated  DRFx  Plan: HT block instilled, DRFx reduced gently and ST splint applied.  Sling placed.   Post-reduction xrays pending.  Patient to be admitted via hospitalist service for pain control and exploration of home care needs/environment. F/u in our office in 7-10 days for new upright shoulder xrays and 4-view wrist xrays in the splint.

## 2012-03-14 NOTE — H&P (Signed)
[facility] History and Physical  Vanessa Mcbride:[id number] DOB: 08-23-1924 DOA: 03/14/2012   PCP: Provider Not In System   Chief Complaint: mechanical fall  HPI:  78 year old female with a history of stroke, hypertension, depression, and Parkinson's disease presents to the emergency department after a mechanical fall onto her left side. There was no syncope. The patient was trying to go up stairs and missed a step and fell down to her left side. The patient denied any chest discomfort, shortness of breath palpitations, dizziness, extremity weakness,visual changes. X-rays in the emergency department revealed a left humeral fracture as well as a mildly displaced left radial fracture. Orthopedics, Dr. Janee Morn, has already seen the patient and reduced the wrist fracture.  No surgery is needed at this time.  The pt is placed in a sling/immobilizer and soft cast. Unfortunately, the patient had significant pain throughout her emergency department stay requiring 2.5 mg of IV hydromorphone. The patient is now being admitted for observation for pain control. Of note, the patient is from [place], and she is visiting here her family here. She was needed to go back on Thursday. The patient denies any fevers, chills, chest pain, nausea, vomiting, diarrhea, abdominal pain, dysuria, hematuria. Assessment/Plan: Uncontrolled acute pain -Requiring IV opioid analgesia -Hydromorphone 2 mg IV every 3 hours when necessary pain -Possible conversion to long-acting opioid in the morning -Stool softener Left humeral and radial fracture -Dr. Mack Hook has already reduced the radial fracture -no surgery needed Hypertension -Continue amlodipine 2.5 mg each bedtime and metoprolol succinate 12.5 mg each bedtime -The patient's elevated blood pressure partly due to pain History of stroke/TIA -Continue aspirin -Patient has baseline dysarthria Hyperlipidemia -Continue Zocor       Past Medical History   Diagnosis Date  . Parkinson's disease   . Stroke   . Hypertension   . Arthritis     osteoarthritis   Past Surgical History  Procedure Date  . No past surgeries    Social History:  reports that she has never smoked. She has never used smokeless tobacco. She reports that she drinks alcohol. She reports that she does not use illicit drugs.   History reviewed. No pertinent family history. family history reviewed. No pertinent family history to the current clinical situation   Allergies  Allergen Reactions  . Iodine Anaphylaxis  . Iohexol      Desc: pt has had contrast in the past (years ago) and it causea anaphylaxis- per pt's daughter.  Vanessa Mcbride,rtrct, Onset Date: [id number]       Prior to Admission medications   Medication Sig Start Date End Date Taking? Authorizing Provider  amLODipine (NORVASC) 5 MG tablet Take 2.5 mg by mouth at bedtime.    Yes Historical Provider, MD  aspirin 81 MG tablet Take 81 mg by mouth daily.   Yes Historical Provider, MD  carbidopa-levodopa (SINEMET IR) 25-100 MG per tablet Take 1 tablet by mouth 2 (two) times daily.    Yes Historical Provider, MD  escitalopram (LEXAPRO) 5 MG tablet Take 5 mg by mouth every morning.   Yes Historical Provider, MD  l-methylfolate-b2-b6-b12 (CEREFOLIN) 08-21-48-5 MG TABS Take 1 tablet by mouth daily.   Yes Historical Provider, MD  Melatonin 10 MG TABS Take 1 tablet by mouth at bedtime as needed. sleep   Yes Historical Provider, MD  metoprolol succinate (TOPROL-XL) 25 MG 24 hr tablet Take 12.5 mg by mouth at bedtime.   Yes Historical Provider, MD  Multiple Vitamin (MULTIVITAMIN WITH MINERALS) TABS Take  1 tablet by mouth at bedtime.   Yes Historical Provider, MD  simvastatin (ZOCOR) 10 MG tablet Take 10 mg by mouth at bedtime.   Yes Historical Provider, MD  traMADol (ULTRAM) 50 MG tablet Take 50 mg by mouth 2 (two) times daily. pain   Yes Historical Provider, MD    Review of Systems:  Constitutional:  No weight  loss, night sweats, Fevers, chills, fatigue.  Head&Eyes: No headache.  No vision loss.   ENT:  No Difficulty swallowing,Tooth/dental problems,Sore throat,   Cardio-vascular:  No chest pain, Orthopnea, PND, swelling in lower extremities,  dizziness, palpitations  GI:  No  abdominal pain, nausea, vomiting, diarrhea, loss of appetite Resp:  No shortness of breath with exertion or at rest. No cough. No coughing up of blood  Skin:  no rash or lesions.  GU:  no dysuria, change in color of urine, no urgency or frequency. No flank pain.  Musculoskeletal:   complains of left arm pain Psych:  No change in mood or affect. No depression or anxiety. Neurologic: No headache, no dysesthesia, no focal weakness, no vision loss. No syncope  Physical Exam: Filed Vitals:   03/14/12 1655 03/14/12 1705 03/14/12 2011  BP: 213/99 206/97 168/86  Pulse:   91  Temp: 98 F (36.7 C)    TempSrc: Oral    Resp: 16  12  SpO2: 97%  91%   General:  A&O, NAD, nontoxic, pleasant/cooperative Head/Eye: No conjunctival hemorrhage, no icterus, [place]/AT, No nystagmus ENT:  No icterus,  No thrush, good dentition, no pharyngeal exudate Neck:  No masses, no lymphadenpathy CV:  RRR, no rub, no gallop, no S3 Lung:  CTAB, good air movement, no wheeze, no rhonchi Abdomen: soft/NT, +BS, nondistended, no peritoneal signs Ext: No cyanosis, No rashes, No petechiae, No lymphangitis, trace edema legs Neuro: Light touch intact to the bilateral lower extremities. Capillary refill less than 2 seconds bilaterally to the fingers.  Labs on Admission:  Basic Metabolic Panel:  Lab 03/14/12 1610  NA 134*  K 3.8  CL 101  CO2 25  GLUCOSE 119*  BUN 11  CREATININE 0.46*  CALCIUM 8.7  MG --  PHOS --   Liver Function Tests: No results found for this basename: AST:5,ALT:5,ALKPHOS:5,BILITOT:5,PROT:5,ALBUMIN:5 in the last 168 hours No results found for this basename: LIPASE:5,AMYLASE:5 in the last 168 hours No results found for  this basename: AMMONIA:5 in the last 168 hours CBC:  Lab 03/14/12 1745  WBC 6.2  NEUTROABS 4.8  HGB 12.8  HCT 37.4  MCV 92.1  PLT 189   Cardiac Enzymes: No results found for this basename: CKTOTAL:5,CKMB:5,CKMBINDEX:5,TROPONINI:5 in the last 168 hours BNP: No components found with this basename: POCBNP:5 CBG: No results found for this basename: GLUCAP:5 in the last 168 hours  Radiological Exams on Admission: Dg Pelvis 1-2 Views  03/14/2012  *RADIOLOGY REPORT*  Clinical Data: Fall  PELVIS - 1-2 VIEW  Comparison: None.  Findings: No fracture or dislocation is seen.  Visualized bony pelvis appears intact.  Mild degenerative changes of the bilateral hips and lower lumbar spine.  IMPRESSION: No fracture or dislocation is seen.   Original Report Authenticated By: Charline Bills, M.D.    Dg Wrist Complete Left  03/14/2012  *RADIOLOGY REPORT*  Clinical Data: Fall, left wrist pain  LEFT WRIST - COMPLETE 3+ VIEW  Comparison: None.  Findings: Transverse distal radial fracture with apex volar angulation.  Old nonunited ulnar styloid fracture.  Associated soft tissue swelling.  IMPRESSION: Transverse distal radial fracture  with apex volar angulation.   Original Report Authenticated By: Charline Bills, M.D.    Ct Head Wo Contrast  03/14/2012  *RADIOLOGY REPORT*  Clinical Data: Fall,  did not hit head.  CT HEAD WITHOUT CONTRAST  Technique:  Contiguous axial images were obtained from the base of the skull through the vertex without contrast.  Comparison: None.  Findings: The patient had difficulty remaining motionless for the study.  Images are suboptimal.  Small or subtle lesions could be overlooked.  There is no evidence for acute infarction, intracranial hemorrhage, mass lesion, hydrocephalus, or extra-axial fluid. Moderate atrophy is present.  There is chronic microvascular ischemic change in the periventricular and subcortical white matter.  The calvarium is intact.  There is mild chronic sinus  disease in the ethmoid and maxillary regions.  IMPRESSION: Atrophy and chronic microvascular ischemic change.  No acute intracranial abnormality.   Original Report Authenticated By: Davonna Belling, M.D.    Dg Shoulder Left  03/14/2012  *RADIOLOGY REPORT*  Clinical Data: Fall, left shoulder pain  LEFT SHOULDER - 2+ VIEW  Comparison: None.  Findings: Suspected three-part proximal humerus fracture with transverse surgical neck component and mildly displaced greater tuberosity fragment.  Visualized left lung is clear.  IMPRESSION: Suspected three-part proximal humerus fracture, as described above.   Original Report Authenticated By: Charline Bills, M.D.    Dg Humerus Left  03/14/2012  *RADIOLOGY REPORT*  Clinical Data: Fall, left shoulder pain  LEFT HUMERUS - 2+ VIEW  Comparison: None.  Findings: Suspected three-part proximal humeral fracture with transverse surgical neck component and mildly displaced greater tuberosity fragment.  No distal humeral fracture is seen.  Visualized left lung is clear.  IMPRESSION: Suspected three-part proximal humerus fracture, as described above.   Original Report Authenticated By: Charline Bills, M.D.     EKG: Independently reviewed. Sinus rhythm, no ST to T wave changes    Time spent:70 minutes Code Status:   Full Family Communication:   Family at bedside   Kathrina Crosley, DO  [facility] Pager [phone]  If 7PM-7AM, please contact night-coverage [email or web address] Password [facility] 03/14/2012, 8:55 PM

## 2012-03-14 NOTE — Progress Notes (Signed)
PHARMACIST - PHYSICIAN ORDER COMMUNICATION  CONCERNING: P&T Medication Policy on Herbal Medications  DESCRIPTION:  This patient's order for: Melatonin has been noted.  This product(s) is classified as an "herbal" or natural product. Due to a lack of definitive safety studies or FDA approval, nonstandard manufacturing practices, plus the potential risk of unknown drug-drug interactions while on inpatient medications, the Pharmacy and Therapeutics Committee does not permit the use of "herbal" or natural products of this type within [facility].   ACTION TAKEN: The pharmacy department is unable to verify this order at this time and your patient has been informed of this safety policy. Please reevaluate patient's clinical condition at discharge and address if the herbal or natural product(s) should be  resumed at that time.  Loralee Pacas, PharmD, BCPS Pharmacy: [phone]

## 2012-03-14 NOTE — ED Provider Notes (Addendum)
History     CSN: [id number]  Arrival date & time 03/14/12  1644   First MD Initiated Contact with Patient 03/14/12 1651      Chief Complaint  Patient presents with  . Fall    (Consider location/radiation/quality/duration/timing/severity/associated sxs/prior treatment) HPI Comments: Pt with hx of stroke, parkinson's not on any anticoagulants comes in with cc of fall. Pt reports falling down on to her left side while climbing stairs right before arrival. She denies LOC, no headaches, neck pain, numbness, tingling anywhere. She has left sided pain, mostly in the upper extremity. No n/v/f/c.   Patient is a 78 y.o. female presenting with fall. The history is provided by the patient.  Fall Pertinent negatives include no abdominal pain, no nausea, no vomiting and no hematuria.    Past Medical History  Diagnosis Date  . Parkinson's disease   . Stroke     No past surgical history on file.  No family history on file.  History  Substance Use Topics  . Smoking status: Not on file  . Smokeless tobacco: Not on file  . Alcohol Use:     OB History    Grav Para Term Preterm Abortions TAB SAB Ect Mult Living                  Review of Systems  Constitutional: Negative for activity change.  HENT: Negative for facial swelling and neck pain.   Respiratory: Negative for cough, shortness of breath and wheezing.   Cardiovascular: Negative for chest pain.  Gastrointestinal: Negative for nausea, vomiting, abdominal pain, diarrhea, constipation, blood in stool and abdominal distention.  Genitourinary: Negative for hematuria and difficulty urinating.  Musculoskeletal: Positive for arthralgias. Negative for gait problem.  Skin: Negative for color change.  Neurological: Negative for speech difficulty.  Hematological: Does not bruise/bleed easily.  Psychiatric/Behavioral: Negative for confusion.    Allergies  Iodine and Iohexol  Home Medications   Current Outpatient Rx  Name   Route  Sig  Dispense  Refill  . AMLODIPINE BESYLATE 5 MG PO TABS   Oral   Take 5 mg by mouth daily.         Marland Kitchen CARBIDOPA-LEVODOPA 25-100 MG PO TABS   Oral   Take 0.5 tablets by mouth 3 (three) times daily.         . L-METHYLFOLATE-B12-B6-B2 08-21-48-5 MG PO TABS   Oral   Take 1 tablet by mouth daily.         Marland Kitchen LABETALOL HCL 100 MG PO TABS   Oral   Take 100 mg by mouth 2 (two) times daily.         Marland Kitchen MELATONIN 10 MG PO TABS   Oral   Take 1 tablet by mouth at bedtime as needed. sleep         . SIMVASTATIN 10 MG PO TABS   Oral   Take 10 mg by mouth at bedtime.         . TRAMADOL HCL 50 MG PO TABS   Oral   Take 50 mg by mouth every 6 (six) hours as needed. pain         . ZOLPIDEM TARTRATE 5 MG PO TABS   Oral   Take 5 mg by mouth at bedtime as needed. sleep           BP 206/97  Temp 98 F (36.7 C) (Oral)  Resp 16  SpO2 97%  Physical Exam  Nursing note and vitals reviewed. Constitutional: She  is oriented to person, place, and time. She appears well-developed and well-nourished.  HENT:  Head: Normocephalic and atraumatic.  Eyes: EOM are normal.  Neck: Neck supple.       No midline c-spine tenderness, pt able to turn head to 45 degrees bilaterally without any pain and able to flex neck to the chest and extend without any pain or neurologic symptoms.  Cardiovascular: Normal rate, regular rhythm and normal heart sounds.   No murmur heard. Pulmonary/Chest: Effort normal. No respiratory distress.  Abdominal: Soft. She exhibits no distension. There is no tenderness. There is no rebound and no guarding.  Musculoskeletal:       Head to toe evaluation shows no hematoma, bleeding of the scalp, no facial abrasions, step offs, crepitus, no tenderness to palpation of the bilateral lower extremities, right upper extremity, no gross deformities, no chest tenderness, no pelvic pain.  Left proximal humerus region has significant swelling and tenderness. Left distal radius  area has some swelling and tenderness as well Pt has 2+ DP, sensation is intact, and grip strength is intact.  Neurological: She is alert and oriented to person, place, and time.  Skin: Skin is warm and dry.    ED Course  Procedures (including critical care time)   Labs Reviewed  CBC WITH DIFFERENTIAL  BASIC METABOLIC PANEL   No results found.   No diagnosis found.    MDM   Date: 03/14/2012  Rate: 85  Rhythm: normal sinus rhythm  QRS Axis: normal  Intervals: normal  ST/T Wave abnormalities: normal  Conduction Disutrbances: none  Narrative Interpretation: unremarkable  Pt comes in with cc of fall.  DDx includes: - Mechanical falls - ICH - Fractures - Contusions - Soft tissue injury  We will get CT head due to her age and left humerus films. Suspected proximal humeral fracture.    Derwood Kaplan, MD 03/14/12 1736  8:04 PM Dr. Merlyn Albert - proximal humeral fracture can be managed non operatively, shoulder immobilizer ordered. Dr. Janee Morn from hand also consulted for distal radial fracture. He is going to come in and try to reduce with gravity, sugar tong placement.  Pt is having severe pain. Already gotten 100 mcg fentanyl, 1 mg dialudid x 2 and 0.5 mg dilaudid. Will try to admit for pain control.  Derwood Kaplan, MD 03/14/12 2013

## 2012-03-14 NOTE — ED Notes (Signed)
YNW:[id number]<OZ> Expected date:03/14/12<BR> Expected time: 4:28 PM<BR> Means of arrival:Ambulance<BR> Comments:<BR> 87yoF/fall

## 2012-03-15 DIAGNOSIS — G20 Parkinson's disease: Secondary | ICD-10-CM | POA: Diagnosis not present

## 2012-03-15 DIAGNOSIS — I10 Essential (primary) hypertension: Secondary | ICD-10-CM | POA: Diagnosis not present

## 2012-03-15 DIAGNOSIS — S42309A Unspecified fracture of shaft of humerus, unspecified arm, initial encounter for closed fracture: Secondary | ICD-10-CM | POA: Diagnosis not present

## 2012-03-15 DIAGNOSIS — S52599A Other fractures of lower end of unspecified radius, initial encounter for closed fracture: Secondary | ICD-10-CM | POA: Diagnosis not present

## 2012-03-15 LAB — BASIC METABOLIC PANEL
Chloride: 100 mEq/L (ref 96–112)
GFR calc Af Amer: >90 mL/min (ref >90)
GFR calc non Af Amer: >90 mL/min (ref >90)
Potassium: 3.8 mEq/L (ref 3.5–5.1)
Sodium: 132 mEq/L — ABNORMAL LOW (ref 135–145)

## 2012-03-15 MED ORDER — OXYCODONE-ACETAMINOPHEN 5-325 MG PO TABS: 1.0000 | ORAL_TABLET | ORAL | Status: DC | PRN

## 2012-03-15 MED ORDER — DSS 100 MG PO CAPS: 100.0000 mg | ORAL_CAPSULE | Freq: Two times a day (BID) | ORAL | Status: DC

## 2012-03-15 NOTE — Progress Notes (Addendum)
Pt admitted from the ED. Pt settled in and orientated to the unit. Pt's daughter at bedside. Pt and daughter declined watching pt safety video at this time in order for the pt to rest. Pt safety plan discussed with daughter. Pt alert and oriented to self and situation. Pt refused to elevate shoulder at this time. Pt's hand elevated. Pt voided upon admission using the bedside commode x2 assist. Pt and family have no concern at this time except managing the pt's pain. Pt given PRN pain medication per MD order.

## 2012-03-15 NOTE — Evaluation (Signed)
Occupational Therapy Evaluation Patient Details Name: DENEA CHEANEY MRN: [id number] DOB: 09/08/1924 Today's Date: 03/15/2012 Time: 9562-1308 OT Time Calculation (min): 37 min  OT Assessment / Plan / Recommendation Clinical Impression  Pt is an 78 yo polish speaking female who presents with a distal radial and proximal humeral fx. No surgical intervention. Educated family memebers how to safely mobilize and care for pt but family was unreceptive and easily agitated stating, "they've got it." Daughter stated pt would be taken to rehab after Xmas. Pt will need 24 hour close A and supervision. Presents as a high fall risk.    OT Assessment  Patient does not need any further OT services    Follow Up Recommendations  Supervision/Assistance - 24 hour    Barriers to Discharge      Equipment Recommendations  3 in 1 bedside comode;Hospital bed;Wheelchair (measurements OT)    Recommendations for Other Services    Frequency       Precautions / Restrictions Precautions Precautions: Fall Precaution Comments: Pt is [place] speaking Restrictions Weight Bearing Restrictions: Yes LUE Weight Bearing: Non weight bearing   Pertinent Vitals/Pain Pt was in obvious pain and dress. Repositioned and RN aware.     ADL  Toilet Transfer: Minimal assistance Toilet Transfer Method: Sit to stand Toilet Transfer Equipment: Comfort height toilet Toileting - Clothing Manipulation and Hygiene: +1 Total assistance Where Assessed - Toileting Clothing Manipulation and Hygiene: Sit to stand from 3-in-1 or toilet ADL Comments: Educated daughter how to don/doff sling. How to don/doff button up shirt and how to position sling while sleeping.    OT Diagnosis:    OT Problem List:   OT Treatment Interventions:     OT Goals    Visit Information  Last OT Received On: 03/15/12 Assistance Needed: +2 PT/OT Co-Evaluation/Treatment: Yes    Subjective Data  Subjective: PAIN KILLER. Patient Stated Goal: PAIN KILLER.   Prior Functioning     Home Living Lives With: Family Available Help at Discharge: Family;Available 24 hours/day Type of Home: House Home Access: Stairs to enter [employer] of Steps: 3 Entrance Stairs-Rails: None Home Layout: One level Firefighter: Standard Bathroom Accessibility: Yes How Accessible: Accessible via walker (in pool house. ) Home Adaptive Equipment: Bedside commode/3-in-1;Wheelchair - manual;Hospital bed Additional Comments: Will have to take pt to alternate bathroom outside of house to fit w/c.  Prior Function Level of Independence: Needs assistance Comments: Unable to obtail full history due to family dynamics and language barrier.  Communication Communication: Prefers language other than English Dominant Hand: Right         Vision/Perception     Cognition  Overall Cognitive Status: Difficult to assess Difficult to assess due to: Non-English speaking Arousal/Alertness: Awake/alert Orientation Level: Appears intact for tasks assessed Behavior During Session: Restless    Extremity/Trunk Assessment Right Upper Extremity Assessment RUE ROM/Strength/Tone: [facility] for tasks assessed Left Upper Extremity Assessment LUE ROM/Strength/Tone: Unable to fully assess;Due to precautions Right Lower Extremity Assessment RLE ROM/Strength/Tone: Deficits RLE ROM/Strength/Tone Deficits: Pt very weak and unsteady, grossly 3/5 Left Lower Extremity Assessment LLE ROM/Strength/Tone: Deficits LLE ROM/Strength/Tone Deficits: Pt very weak and unsteady, grossly 3/5.      Mobility Bed Mobility Bed Mobility: Supine to Sit (Simultaneous filing. User may not have seen previous data.) Supine to Sit: 4: Min assist (Simultaneous filing. User may not have seen previous data.) Details for Bed Mobility Assistance: Assist for trunk and LEs.  Provided cues for technique, however pt [place] speaking and had several family members attempting  to assist.  (Simultaneous filing.  User may not have seen previous data.) Transfers Sit to Stand: 4: Min assist;From bed Stand to Sit: 4: Min assist;To chair/3-in-1 Details for Transfer Assistance: Assist to rise and steady, again with max cues for safety and technique, however family unreceptive to education on safety and several family members trying to assist.      Shoulder Instructions     Exercise     Balance     End of Session OT - End of Session Activity Tolerance: Patient limited by pain;Treatment limited secondary to agitation Patient left: in chair;with call bell/phone within reach;with family/visitor present Nurse Communication: Mobility status  GO Functional Assessment Tool Used: Clinical Judgement Functional Limitation: Self care Self Care Current Status (Z6109): At least 60 percent but less than 80 percent impaired, limited or restricted Self Care Goal Status (U0454): At least 40 percent but less than 60 percent impaired, limited or restricted Self Care Discharge Status [phone]): At least 40 percent but less than 60 percent impaired, limited or restricted   Zannah Melucci A OTR/L [phone] 03/15/2012, 12:58 PM

## 2012-03-15 NOTE — Progress Notes (Signed)
Discharge instructions given to pt/dtr. Verbalized understanding. Left the unit in stable condition. 

## 2012-03-15 NOTE — Progress Notes (Signed)
Pt refused to take ordered medications. Pt's daughter was able to get pt to take her prescribed norvasc. Pt had not took her regularly scheduled medications for HTN today. Pt's bp on admission to unit was 184/82. Pt 's bp rechecked and is currently 147/72.

## 2012-03-15 NOTE — Discharge Summary (Signed)
[facility]  Physician Discharge Summary   Patient ID: Vanessa Mcbride MRN: [id number] DOB/AGE: 1924/10/11 78 y.o.  Admit date: 03/14/2012 Discharge date: 03/15/2012  PCP: She is from out of town  DISCHARGE DIAGNOSES:  Active Problems:  Humerus fracture  Distal radius fracture, left  Uncontrolled pain  HTN (hypertension)  Hyperlipidemia  Parkinson disease   RECOMMENDATIONS FOR OUTPATIENT FOLLOW UP: 1. Needs follow up with Ortho for repeat films  DISCHARGE CONDITION: fair  Diet recommendation: Low Sodium  Filed Weights   03/14/12 2224  Weight: 48.081 kg (106 lb)    INITIAL HISTORY: 78 year old female with a history of stroke, hypertension, depression, and Parkinson's disease presented to the emergency department after a mechanical fall onto her left side. There was no syncope. The patient was trying to go up stairs and missed a step and fell down to her left side. The patient denied any chest discomfort, shortness of breath palpitations, dizziness, extremity weakness,visual changes. X-rays in the emergency department revealed a left humeral fracture as well as a mildly displaced left radial fracture. Orthopedics, Dr. Janee Morn, has already seen the patient and reduced the wrist fracture. No surgery is needed at this time. The pt is placed in a sling/immobilizer and soft cast. Unfortunately, the patient had significant pain throughout her emergency department stay requiring 2.5 mg of IV hydromorphone. The patient was then admitted for observation for pain control. Of note, the patient is from [place], and she is visiting here her family here. The patient denied any fevers, chills, chest pain, nausea, vomiting, diarrhea, abdominal pain, dysuria, hematuria.  Consultations:  Ortho (Dr. Janee Morn)  Procedures:  Non-surgical Reduction of radial fracture  HOSPITAL COURSE:    Left humeral and radial fracture due to mechanical fall Dr. Mack Hook has already reduced the  radial fracture. Patient will need to follow up with Dr. Janee Morn to determine further course of action for the humeral fracture. Pain is reasonably well controlled at this time. Patient has been given only intravenous medication. Pain is 5/10 currently. Daughter is a retired Development worker, community who is also Environmental consultant of [employer] in [place]. She wants to take the patient home today. She feels patient will do well at home. She is in the process of arranging SNF/ALF for the patient here in town. Will discharge on percocet. Stool softeners to avoid constipation,. PT/OT to see prior to discharge. Daughter trying to arrange for hospital bed. Will request case manager to see.  History of Hypertension Continue with anti-hypertensive agents. The patient's elevated blood pressure was partly due to pain.   History of stroke/TIA  Continue aspirin. Patient has baseline dysarthria. Otherwise stable. No neurological deficits.  Hyperlipidemia  Continue Zocor   As long as pain is reasonably well controlled and there are no safety issues I don't see why she cannot go home with her daughter.   PERTINENT LABS:  The results of significant diagnostics from this hospitalization (including imaging, microbiology, ancillary and laboratory) are listed below for reference.     Labs: Basic Metabolic Panel:  Lab 03/15/12 7829 03/14/12 1745  NA 132* 134*  K 3.8 3.8  CL 100 101  CO2 28 25  GLUCOSE 125* 119*  BUN 10 11  CREATININE 0.42* 0.46*  CALCIUM 8.9 8.7  MG -- --  PHOS -- --   CBC:  Lab 03/14/12 1745  WBC 6.2  NEUTROABS 4.8  HGB 12.8  HCT 37.4  MCV 92.1  PLT 189     IMAGING STUDIES Dg Pelvis 1-2 Views  03/14/2012  *RADIOLOGY REPORT*  Clinical Data: Fall  PELVIS - 1-2 VIEW  Comparison: None.  Findings: No fracture or dislocation is seen.  Visualized bony pelvis appears intact.  Mild degenerative changes of the bilateral hips and lower lumbar spine.  IMPRESSION: No fracture or dislocation is seen.   Original  Report Authenticated By: Charline Bills, M.D.    Dg Wrist 2 Views Left  03/14/2012  *RADIOLOGY REPORT*  Clinical Data: Status post reduction of left wrist fracture.  LEFT WRIST - 2 VIEW  Comparison: Left wrist radiographs performed earlier today at 06:11 p.m.  Findings: The comminuted fracture involving the distal radial metaphysis demonstrates grossly unchanged alignment, with dorsal tilt and impaction again noted.  Apparent positive ulnar variance reflects the degree of impaction at the fracture site.  An ulnar styloid fracture is less well characterized due to the overlying splint.  The carpal rows appear grossly intact; mild degenerative change is noted at the first carpometacarpal joint.  No new fractures are seen.  IMPRESSION: Comminuted fracture involving the distal radial metaphysis is grossly unchanged in alignment, with dorsal tilt and impaction again noted.  Apparent positive ulnar variance reflects the degree of impaction at the fracture site.  Ulnar styloid fracture is less well characterized due to the overlying splint.   Original Report Authenticated By: Tonia Ghent, M.D.    Dg Wrist Complete Left  03/14/2012  *RADIOLOGY REPORT*  Clinical Data: Fall, left wrist pain  LEFT WRIST - COMPLETE 3+ VIEW  Comparison: None.  Findings: Transverse distal radial fracture with apex volar angulation.  Old nonunited ulnar styloid fracture.  Associated soft tissue swelling.  IMPRESSION: Transverse distal radial fracture with apex volar angulation.   Original Report Authenticated By: Charline Bills, M.D.    Ct Head Wo Contrast  03/14/2012  *RADIOLOGY REPORT*  Clinical Data: Fall,  did not hit head.  CT HEAD WITHOUT CONTRAST  Technique:  Contiguous axial images were obtained from the base of the skull through the vertex without contrast.  Comparison: None.  Findings: The patient had difficulty remaining motionless for the study.  Images are suboptimal.  Small or subtle lesions could be overlooked.   There is no evidence for acute infarction, intracranial hemorrhage, mass lesion, hydrocephalus, or extra-axial fluid. Moderate atrophy is present.  There is chronic microvascular ischemic change in the periventricular and subcortical white matter.  The calvarium is intact.  There is mild chronic sinus disease in the ethmoid and maxillary regions.  IMPRESSION: Atrophy and chronic microvascular ischemic change.  No acute intracranial abnormality.   Original Report Authenticated By: Davonna Belling, M.D.    Dg Shoulder Left  03/14/2012  *RADIOLOGY REPORT*  Clinical Data: Fall, left shoulder pain  LEFT SHOULDER - 2+ VIEW  Comparison: None.  Findings: Suspected three-part proximal humerus fracture with transverse surgical neck component and mildly displaced greater tuberosity fragment.  Visualized left lung is clear.  IMPRESSION: Suspected three-part proximal humerus fracture, as described above.   Original Report Authenticated By: Charline Bills, M.D.    Dg Humerus Left  03/14/2012  *RADIOLOGY REPORT*  Clinical Data: Fall, left shoulder pain  LEFT HUMERUS - 2+ VIEW  Comparison: None.  Findings: Suspected three-part proximal humeral fracture with transverse surgical neck component and mildly displaced greater tuberosity fragment.  No distal humeral fracture is seen.  Visualized left lung is clear.  IMPRESSION: Suspected three-part proximal humerus fracture, as described above.   Original Report Authenticated By: Charline Bills, M.D.     DISCHARGE EXAMINATION: Filed Vitals:  03/14/12 2045 03/14/12 2224 03/15/12 0205 03/15/12 0629  BP: 170/87 184/82 147/72 138/67  Pulse: 90 92 89 88  Temp:  97.4 F (36.3 C)  98.2 F (36.8 C)  TempSrc:  Oral  Oral  Resp: 17 16  18   Height:  5' (1.524 m)    Weight:  48.081 kg (106 lb)    SpO2: 98%   100%   General appearance: alert, cooperative, appears stated age and no distress Head: Normocephalic, without obvious abnormality, atraumatic Resp: clear to  auscultation bilaterally Cardio: regular rate and rhythm, S1, S2 normal, no murmur, click, rub or gallop GI: soft, non-tender; bowel sounds normal; no masses,  no organomegaly Extremities: LUE in sling. Pulses: 2+ and symmetric Skin: Skin color, texture, turgor normal. No rashes or lesions Neurologic: Alert and oriented to person, place and date, month, year. No focal deficits. Slightly drowsy due to narcotics. But easily arousable.  DISPOSITION: Home with daughter  Discharge Orders    Future Orders Please Complete By Expires   Diet - low sodium heart healthy      Increase activity slowly      Discharge instructions      Comments:   Seek attention if pain worsens.     Current Discharge Medication List    START taking these medications   Details  docusate sodium 100 MG CAPS Take 100 mg by mouth 2 (two) times daily. Qty: 60 capsule, Refills: 1    oxyCODONE-acetaminophen (PERCOCET) 5-325 MG per tablet Take 1-2 tablets by mouth every 4 (four) hours as needed for pain (max: 3g of acetaminophen in 24hrs period). Qty: 30 tablet, Refills: 0      CONTINUE these medications which have NOT CHANGED   Details  amLODipine (NORVASC) 5 MG tablet Take 2.5 mg by mouth at bedtime.     aspirin 81 MG tablet Take 81 mg by mouth daily.    carbidopa-levodopa (SINEMET IR) 25-100 MG per tablet Take 1 tablet by mouth 2 (two) times daily.     escitalopram (LEXAPRO) 5 MG tablet Take 5 mg by mouth every morning.    l-methylfolate-b2-b6-b12 (CEREFOLIN) 08-21-48-5 MG TABS Take 1 tablet by mouth daily.    Melatonin 10 MG TABS Take 1 tablet by mouth at bedtime as needed. sleep    metoprolol succinate (TOPROL-XL) 25 MG 24 hr tablet Take 12.5 mg by mouth at bedtime.    Multiple Vitamin (MULTIVITAMIN WITH MINERALS) TABS Take 1 tablet by mouth at bedtime.    simvastatin (ZOCOR) 10 MG tablet Take 10 mg by mouth at bedtime.      STOP taking these medications     traMADol (ULTRAM) 50 MG tablet         Follow-up Information    Schedule an appointment as soon as possible for a visit with Janee Morn, DAVID A., MD. (7-10 days from 03-14-12)    Contact information:   [address] [place] [address] [phone]          TOTAL DISCHARGE TIME: 35 mins  [facility]  [facility] Pager [phone]  03/15/2012, 10:21 AM

## 2012-03-15 NOTE — Progress Notes (Signed)
Met with pts daughter about d/c planning. She stated they needed a hospital bed and BSC. [facility] from [facility].

## 2012-03-15 NOTE — Evaluation (Addendum)
Physical Therapy Evaluation Patient Details Name: Vanessa Mcbride MRN: [id number] DOB: 1924/12/02 Today's Date: 03/15/2012 Time: 1200-1229 PT Time Calculation (min): 29 min  PT Assessment / Plan / Recommendation Clinical Impression  Pt presents with distal radius and proximal humerus fracture after sustaining a fall at home.  Fractures are non-operable.  Pt with increased pain throughout session.  Assisted to/from restroom and ambulation in hallway, however pt VERY unsteady and demos shuffled gait pattern.  Provided max education and cues for family to assist and manage w/c on stairs, however pt unreceptive to most education and became agitated during session.  Pts daughter is taking pt home for holidays and then to [facility] on Friday.  All needs can be met in SNF.  PT will sign off at this time.     PT Assessment  All further PT needs can be met in the next venue of care    Follow Up Recommendations  Supervision/Assistance - 24 hour    Does the patient have the potential to tolerate intense rehabilitation      Barriers to Discharge None Pt will have 24/7 assist, however she is very high risk for falling.     Equipment Recommendations  None recommended by PT    Recommendations for Other Services     Frequency      Precautions / Restrictions Precautions Precautions: Fall Precaution Comments: Pt is [place] speaking Restrictions Weight Bearing Restrictions: Yes LUE Weight Bearing: Non weight bearing   Pertinent Vitals/Pain 10/10      Mobility  Bed Mobility Bed Mobility: Supine to Sit (Simultaneous filing. User may not have seen previous data.) Supine to Sit: 4: Min assist (Simultaneous filing. User may not have seen previous data.) Details for Bed Mobility Assistance: Assist for trunk and LEs.  Provided cues for technique, however pt [place] speaking and had several family members attempting to assist.  (Simultaneous filing. User may not have seen previous  data.) Transfers Transfers: Sit to Stand;Stand to Sit Sit to Stand: 4: Min assist;From bed Stand to Sit: 4: Min assist;To chair/3-in-1 Details for Transfer Assistance: Assist to rise and steady, again with max cues for safety and technique, however family unreceptive to education on safety and several family members trying to assist.  Ambulation/Gait Ambulation/Gait Assistance: 4: Min assist;3: Mod assist Ambulation Distance (Feet): 100 Feet Assistive device: 1 person hand held assist;2 person hand held assist Ambulation/Gait Assistance Details: Initially provided 2 person HHA then tranitioned to 1 person HHA to ensure that family can manage at home.  Pt very unsteady with shuffled gait pattern.  Gait Pattern: Shuffle Gait velocity: decreased Stairs: No (educated family on use of w/c on stairs. ) Wheelchair Mobility Wheelchair Mobility: No    Shoulder Instructions     Exercises     PT Diagnosis: Difficulty walking;Generalized weakness;Acute pain  PT Problem List: Decreased strength;Decreased activity tolerance;Decreased balance;Decreased mobility;Decreased range of motion;Decreased coordination;Decreased cognition;Decreased knowledge of use of DME;Decreased safety awareness;Decreased knowledge of precautions;Pain PT Treatment Interventions:     PT Goals    Visit Information  Last PT Received On: 03/15/12 Assistance Needed: +2 PT/OT Co-Evaluation/Treatment: Yes    Subjective Data  Subjective: Pain killer!   Prior Functioning  Home Living Lives With: Family Available Help at Discharge: Family;Available 24 hours/day Type of Home: House Home Access: Stairs to enter [employer] of Steps: 3 Entrance Stairs-Rails: None Home Layout: One level Firefighter: Standard Bathroom Accessibility: Yes How Accessible: Accessible via walker (in pool house. ) Home Adaptive Equipment: Bedside  commode/3-in-1;Wheelchair - manual;Hospital bed Additional Comments: Will have to  take pt to alternate bathroom outside of house to fit w/c.  Prior Function Level of Independence: Needs assistance Comments: Unable to obtail full history due to family dynamics and language barrier.  Communication Communication: Prefers language other than English Dominant Hand: Right    Cognition  Overall Cognitive Status: Difficult to assess Difficult to assess due to: Non-English speaking Arousal/Alertness: Awake/alert Orientation Level: Appears intact for tasks assessed Behavior During Session: Restless    Extremity/Trunk Assessment Right Upper Extremity Assessment RUE ROM/Strength/Tone: [facility] for tasks assessed Left Upper Extremity Assessment LUE ROM/Strength/Tone: Unable to fully assess;Due to precautions Right Lower Extremity Assessment RLE ROM/Strength/Tone: Deficits RLE ROM/Strength/Tone Deficits: Pt very weak and unsteady, grossly 3/5 Left Lower Extremity Assessment LLE ROM/Strength/Tone: Deficits LLE ROM/Strength/Tone Deficits: Pt very weak and unsteady, grossly 3/5.    Balance    End of Session PT - End of Session Equipment Utilized During Treatment: Other (comment) (sling for LUE) Activity Tolerance: Patient limited by pain;Treatment limited secondary to agitation Patient left: in chair;with call bell/phone within reach;with family/visitor present Nurse Communication: Mobility status;Patient requests pain meds  GP Functional Assessment Tool Used: Clinical judgement.  Functional Limitation: Mobility: Walking and moving around Mobility: Walking and Moving Around Current Status [phone]): At least 20 percent but less than 40 percent impaired, limited or restricted Mobility: Walking and Moving Around Goal Status [phone]): At least 20 percent but less than 40 percent impaired, limited or restricted   Page, Meribeth Mattes 03/15/2012, 1:00 PM

## 2012-03-15 NOTE — Progress Notes (Signed)
Nutrition Brief Note  Intervention: - Recommend MD discuss appetite stimulant r/t ongoing poor appetite - Educated daughter on high calorie/protein diet therapy and provided handout of this information  Pt meets criteria for severe malnutrition of chronic illness AEB <75% estimated energy intake with 22% weight loss in the past year.   Patient identified on the Malnutrition Screening Tool (MST) Report  Body mass index is 20.70 kg/(m^2). Pt meets criteria for normal weight based on current BMI.   Current diet order is heart healthy, no intake of meals recorded. Labs and medications reviewed. Pt asleep during visit, met with daughter at bedside who reported pt with poor appetite PTA with 30 pound unintended weight loss in the past year. She states she has tried everything to get her mother to eat and gives her Ensure and [employer] but she only eats small amounts of foods and supplements. Daughter very concerned about pt's nutrition. Noted pt being discharged today.   Levon Hedger MS, RD, LDN [phone] Pager [phone] After Hours Pager

## 2012-03-18 DIAGNOSIS — S52599A Other fractures of lower end of unspecified radius, initial encounter for closed fracture: Secondary | ICD-10-CM | POA: Diagnosis not present

## 2012-03-18 DIAGNOSIS — R5381 Other malaise: Secondary | ICD-10-CM

## 2012-03-18 DIAGNOSIS — I10 Essential (primary) hypertension: Secondary | ICD-10-CM | POA: Diagnosis not present

## 2012-03-18 DIAGNOSIS — G20 Parkinson's disease: Secondary | ICD-10-CM | POA: Diagnosis not present

## 2012-03-18 DIAGNOSIS — K5900 Constipation, unspecified: Secondary | ICD-10-CM | POA: Diagnosis not present

## 2012-03-18 DIAGNOSIS — S42209A Unspecified fracture of upper end of unspecified humerus, initial encounter for closed fracture: Secondary | ICD-10-CM | POA: Diagnosis not present

## 2012-03-18 HISTORY — DX: Other malaise: R53.81

## 2012-03-20 DIAGNOSIS — S52599A Other fractures of lower end of unspecified radius, initial encounter for closed fracture: Secondary | ICD-10-CM

## 2012-03-20 DIAGNOSIS — S42309A Unspecified fracture of shaft of humerus, unspecified arm, initial encounter for closed fracture: Secondary | ICD-10-CM

## 2012-03-20 DIAGNOSIS — G4700 Insomnia, unspecified: Secondary | ICD-10-CM

## 2012-03-20 HISTORY — DX: Other fractures of lower end of unspecified radius, initial encounter for closed fracture: S52.599A

## 2012-03-20 HISTORY — DX: Insomnia, unspecified: G47.00

## 2012-03-20 HISTORY — DX: Unspecified fracture of shaft of humerus, unspecified arm, initial encounter for closed fracture: S42.309A

## 2012-03-21 DIAGNOSIS — R269 Unspecified abnormalities of gait and mobility: Secondary | ICD-10-CM | POA: Diagnosis not present

## 2012-03-21 DIAGNOSIS — R489 Unspecified symbolic dysfunctions: Secondary | ICD-10-CM | POA: Diagnosis not present

## 2012-03-21 DIAGNOSIS — F8089 Other developmental disorders of speech and language: Secondary | ICD-10-CM | POA: Diagnosis not present

## 2012-03-21 DIAGNOSIS — Z9181 History of falling: Secondary | ICD-10-CM | POA: Diagnosis not present

## 2012-03-21 DIAGNOSIS — M6281 Muscle weakness (generalized): Secondary | ICD-10-CM | POA: Diagnosis not present

## 2012-03-21 DIAGNOSIS — M2550 Pain in unspecified joint: Secondary | ICD-10-CM | POA: Diagnosis not present

## 2012-03-21 DIAGNOSIS — R1312 Dysphagia, oropharyngeal phase: Secondary | ICD-10-CM | POA: Diagnosis not present

## 2012-03-21 DIAGNOSIS — S52599A Other fractures of lower end of unspecified radius, initial encounter for closed fracture: Secondary | ICD-10-CM | POA: Diagnosis not present

## 2012-03-21 DIAGNOSIS — R609 Edema, unspecified: Secondary | ICD-10-CM | POA: Diagnosis not present

## 2012-03-21 DIAGNOSIS — S42213A Unspecified displaced fracture of surgical neck of unspecified humerus, initial encounter for closed fracture: Secondary | ICD-10-CM | POA: Diagnosis not present

## 2012-03-21 DIAGNOSIS — Z4789 Encounter for other orthopedic aftercare: Secondary | ICD-10-CM | POA: Diagnosis not present

## 2012-03-21 DIAGNOSIS — R279 Unspecified lack of coordination: Secondary | ICD-10-CM | POA: Diagnosis not present

## 2012-03-21 DIAGNOSIS — G20 Parkinson's disease: Secondary | ICD-10-CM | POA: Diagnosis not present

## 2012-03-21 DIAGNOSIS — M84429A Pathological fracture, unspecified humerus, initial encounter for fracture: Secondary | ICD-10-CM | POA: Diagnosis not present

## 2012-03-22 DIAGNOSIS — M6281 Muscle weakness (generalized): Secondary | ICD-10-CM | POA: Diagnosis not present

## 2012-03-22 DIAGNOSIS — F8089 Other developmental disorders of speech and language: Secondary | ICD-10-CM | POA: Diagnosis not present

## 2012-03-22 DIAGNOSIS — R269 Unspecified abnormalities of gait and mobility: Secondary | ICD-10-CM | POA: Diagnosis not present

## 2012-03-22 DIAGNOSIS — R279 Unspecified lack of coordination: Secondary | ICD-10-CM | POA: Diagnosis not present

## 2012-03-22 DIAGNOSIS — R609 Edema, unspecified: Secondary | ICD-10-CM | POA: Diagnosis not present

## 2012-03-22 DIAGNOSIS — M2550 Pain in unspecified joint: Secondary | ICD-10-CM | POA: Diagnosis not present

## 2012-03-24 DIAGNOSIS — R489 Unspecified symbolic dysfunctions: Secondary | ICD-10-CM | POA: Diagnosis not present

## 2012-03-24 DIAGNOSIS — M2550 Pain in unspecified joint: Secondary | ICD-10-CM | POA: Diagnosis not present

## 2012-03-24 DIAGNOSIS — M84429A Pathological fracture, unspecified humerus, initial encounter for fracture: Secondary | ICD-10-CM | POA: Diagnosis not present

## 2012-03-24 DIAGNOSIS — Z4789 Encounter for other orthopedic aftercare: Secondary | ICD-10-CM | POA: Diagnosis not present

## 2012-03-24 DIAGNOSIS — R279 Unspecified lack of coordination: Secondary | ICD-10-CM | POA: Diagnosis not present

## 2012-03-24 DIAGNOSIS — F8089 Other developmental disorders of speech and language: Secondary | ICD-10-CM | POA: Diagnosis not present

## 2012-03-24 DIAGNOSIS — R269 Unspecified abnormalities of gait and mobility: Secondary | ICD-10-CM | POA: Diagnosis not present

## 2012-03-24 DIAGNOSIS — R609 Edema, unspecified: Secondary | ICD-10-CM | POA: Diagnosis not present

## 2012-03-24 DIAGNOSIS — Z9181 History of falling: Secondary | ICD-10-CM | POA: Diagnosis not present

## 2012-03-24 DIAGNOSIS — G20 Parkinson's disease: Secondary | ICD-10-CM | POA: Diagnosis not present

## 2012-03-24 DIAGNOSIS — R1312 Dysphagia, oropharyngeal phase: Secondary | ICD-10-CM | POA: Diagnosis not present

## 2012-03-24 DIAGNOSIS — M6281 Muscle weakness (generalized): Secondary | ICD-10-CM | POA: Diagnosis not present

## 2012-03-25 DIAGNOSIS — G20 Parkinson's disease: Secondary | ICD-10-CM | POA: Diagnosis not present

## 2012-03-25 DIAGNOSIS — M2550 Pain in unspecified joint: Secondary | ICD-10-CM | POA: Diagnosis not present

## 2012-03-25 DIAGNOSIS — R269 Unspecified abnormalities of gait and mobility: Secondary | ICD-10-CM | POA: Diagnosis not present

## 2012-03-25 DIAGNOSIS — M6281 Muscle weakness (generalized): Secondary | ICD-10-CM | POA: Diagnosis not present

## 2012-03-25 DIAGNOSIS — M84429A Pathological fracture, unspecified humerus, initial encounter for fracture: Secondary | ICD-10-CM | POA: Diagnosis not present

## 2012-03-25 DIAGNOSIS — F8089 Other developmental disorders of speech and language: Secondary | ICD-10-CM | POA: Diagnosis not present

## 2012-03-26 DIAGNOSIS — M2550 Pain in unspecified joint: Secondary | ICD-10-CM | POA: Diagnosis not present

## 2012-03-26 DIAGNOSIS — F8089 Other developmental disorders of speech and language: Secondary | ICD-10-CM | POA: Diagnosis not present

## 2012-03-26 DIAGNOSIS — M6281 Muscle weakness (generalized): Secondary | ICD-10-CM | POA: Diagnosis not present

## 2012-03-26 DIAGNOSIS — M84429A Pathological fracture, unspecified humerus, initial encounter for fracture: Secondary | ICD-10-CM | POA: Diagnosis not present

## 2012-03-26 DIAGNOSIS — G20 Parkinson's disease: Secondary | ICD-10-CM | POA: Diagnosis not present

## 2012-03-26 DIAGNOSIS — R269 Unspecified abnormalities of gait and mobility: Secondary | ICD-10-CM | POA: Diagnosis not present

## 2012-03-28 DIAGNOSIS — R269 Unspecified abnormalities of gait and mobility: Secondary | ICD-10-CM | POA: Diagnosis not present

## 2012-03-28 DIAGNOSIS — F8089 Other developmental disorders of speech and language: Secondary | ICD-10-CM | POA: Diagnosis not present

## 2012-03-28 DIAGNOSIS — G20 Parkinson's disease: Secondary | ICD-10-CM | POA: Diagnosis not present

## 2012-03-28 DIAGNOSIS — M84429A Pathological fracture, unspecified humerus, initial encounter for fracture: Secondary | ICD-10-CM | POA: Diagnosis not present

## 2012-03-28 DIAGNOSIS — M6281 Muscle weakness (generalized): Secondary | ICD-10-CM | POA: Diagnosis not present

## 2012-03-28 DIAGNOSIS — M2550 Pain in unspecified joint: Secondary | ICD-10-CM | POA: Diagnosis not present

## 2012-03-29 DIAGNOSIS — G20 Parkinson's disease: Secondary | ICD-10-CM | POA: Diagnosis not present

## 2012-03-29 DIAGNOSIS — R269 Unspecified abnormalities of gait and mobility: Secondary | ICD-10-CM | POA: Diagnosis not present

## 2012-03-29 DIAGNOSIS — M2550 Pain in unspecified joint: Secondary | ICD-10-CM | POA: Diagnosis not present

## 2012-03-29 DIAGNOSIS — M84429A Pathological fracture, unspecified humerus, initial encounter for fracture: Secondary | ICD-10-CM | POA: Diagnosis not present

## 2012-03-29 DIAGNOSIS — F8089 Other developmental disorders of speech and language: Secondary | ICD-10-CM | POA: Diagnosis not present

## 2012-03-29 DIAGNOSIS — M6281 Muscle weakness (generalized): Secondary | ICD-10-CM | POA: Diagnosis not present

## 2012-03-30 DIAGNOSIS — M2550 Pain in unspecified joint: Secondary | ICD-10-CM | POA: Diagnosis not present

## 2012-03-30 DIAGNOSIS — M6281 Muscle weakness (generalized): Secondary | ICD-10-CM | POA: Diagnosis not present

## 2012-03-30 DIAGNOSIS — M84429A Pathological fracture, unspecified humerus, initial encounter for fracture: Secondary | ICD-10-CM | POA: Diagnosis not present

## 2012-03-30 DIAGNOSIS — F8089 Other developmental disorders of speech and language: Secondary | ICD-10-CM | POA: Diagnosis not present

## 2012-03-30 DIAGNOSIS — R269 Unspecified abnormalities of gait and mobility: Secondary | ICD-10-CM | POA: Diagnosis not present

## 2012-03-30 DIAGNOSIS — G20 Parkinson's disease: Secondary | ICD-10-CM | POA: Diagnosis not present

## 2012-03-31 DIAGNOSIS — M84429A Pathological fracture, unspecified humerus, initial encounter for fracture: Secondary | ICD-10-CM | POA: Diagnosis not present

## 2012-03-31 DIAGNOSIS — M2550 Pain in unspecified joint: Secondary | ICD-10-CM | POA: Diagnosis not present

## 2012-03-31 DIAGNOSIS — G20 Parkinson's disease: Secondary | ICD-10-CM | POA: Diagnosis not present

## 2012-03-31 DIAGNOSIS — F8089 Other developmental disorders of speech and language: Secondary | ICD-10-CM | POA: Diagnosis not present

## 2012-03-31 DIAGNOSIS — M6281 Muscle weakness (generalized): Secondary | ICD-10-CM | POA: Diagnosis not present

## 2012-03-31 DIAGNOSIS — R269 Unspecified abnormalities of gait and mobility: Secondary | ICD-10-CM | POA: Diagnosis not present

## 2012-04-01 DIAGNOSIS — G20 Parkinson's disease: Secondary | ICD-10-CM | POA: Diagnosis not present

## 2012-04-01 DIAGNOSIS — M84429A Pathological fracture, unspecified humerus, initial encounter for fracture: Secondary | ICD-10-CM | POA: Diagnosis not present

## 2012-04-01 DIAGNOSIS — F8089 Other developmental disorders of speech and language: Secondary | ICD-10-CM | POA: Diagnosis not present

## 2012-04-01 DIAGNOSIS — M6281 Muscle weakness (generalized): Secondary | ICD-10-CM | POA: Diagnosis not present

## 2012-04-01 DIAGNOSIS — M2550 Pain in unspecified joint: Secondary | ICD-10-CM | POA: Diagnosis not present

## 2012-04-01 DIAGNOSIS — R269 Unspecified abnormalities of gait and mobility: Secondary | ICD-10-CM | POA: Diagnosis not present

## 2012-04-02 DIAGNOSIS — F8089 Other developmental disorders of speech and language: Secondary | ICD-10-CM | POA: Diagnosis not present

## 2012-04-02 DIAGNOSIS — M84429A Pathological fracture, unspecified humerus, initial encounter for fracture: Secondary | ICD-10-CM | POA: Diagnosis not present

## 2012-04-02 DIAGNOSIS — M2550 Pain in unspecified joint: Secondary | ICD-10-CM | POA: Diagnosis not present

## 2012-04-02 DIAGNOSIS — G20 Parkinson's disease: Secondary | ICD-10-CM | POA: Diagnosis not present

## 2012-04-02 DIAGNOSIS — R269 Unspecified abnormalities of gait and mobility: Secondary | ICD-10-CM | POA: Diagnosis not present

## 2012-04-02 DIAGNOSIS — M6281 Muscle weakness (generalized): Secondary | ICD-10-CM | POA: Diagnosis not present

## 2012-04-04 DIAGNOSIS — F8089 Other developmental disorders of speech and language: Secondary | ICD-10-CM | POA: Diagnosis not present

## 2012-04-04 DIAGNOSIS — M84429A Pathological fracture, unspecified humerus, initial encounter for fracture: Secondary | ICD-10-CM | POA: Diagnosis not present

## 2012-04-04 DIAGNOSIS — R269 Unspecified abnormalities of gait and mobility: Secondary | ICD-10-CM | POA: Diagnosis not present

## 2012-04-04 DIAGNOSIS — M6281 Muscle weakness (generalized): Secondary | ICD-10-CM | POA: Diagnosis not present

## 2012-04-04 DIAGNOSIS — M2550 Pain in unspecified joint: Secondary | ICD-10-CM | POA: Diagnosis not present

## 2012-04-04 DIAGNOSIS — G20 Parkinson's disease: Secondary | ICD-10-CM | POA: Diagnosis not present

## 2012-04-05 DIAGNOSIS — M2550 Pain in unspecified joint: Secondary | ICD-10-CM | POA: Diagnosis not present

## 2012-04-05 DIAGNOSIS — G20 Parkinson's disease: Secondary | ICD-10-CM | POA: Diagnosis not present

## 2012-04-05 DIAGNOSIS — F8089 Other developmental disorders of speech and language: Secondary | ICD-10-CM | POA: Diagnosis not present

## 2012-04-05 DIAGNOSIS — M6281 Muscle weakness (generalized): Secondary | ICD-10-CM | POA: Diagnosis not present

## 2012-04-05 DIAGNOSIS — R269 Unspecified abnormalities of gait and mobility: Secondary | ICD-10-CM | POA: Diagnosis not present

## 2012-04-05 DIAGNOSIS — M84429A Pathological fracture, unspecified humerus, initial encounter for fracture: Secondary | ICD-10-CM | POA: Diagnosis not present

## 2012-04-06 DIAGNOSIS — M2550 Pain in unspecified joint: Secondary | ICD-10-CM | POA: Diagnosis not present

## 2012-04-06 DIAGNOSIS — M6281 Muscle weakness (generalized): Secondary | ICD-10-CM | POA: Diagnosis not present

## 2012-04-06 DIAGNOSIS — F8089 Other developmental disorders of speech and language: Secondary | ICD-10-CM | POA: Diagnosis not present

## 2012-04-06 DIAGNOSIS — M84429A Pathological fracture, unspecified humerus, initial encounter for fracture: Secondary | ICD-10-CM | POA: Diagnosis not present

## 2012-04-06 DIAGNOSIS — G20 Parkinson's disease: Secondary | ICD-10-CM | POA: Diagnosis not present

## 2012-04-06 DIAGNOSIS — R269 Unspecified abnormalities of gait and mobility: Secondary | ICD-10-CM | POA: Diagnosis not present

## 2012-04-07 DIAGNOSIS — M84429A Pathological fracture, unspecified humerus, initial encounter for fracture: Secondary | ICD-10-CM | POA: Diagnosis not present

## 2012-04-07 DIAGNOSIS — F8089 Other developmental disorders of speech and language: Secondary | ICD-10-CM | POA: Diagnosis not present

## 2012-04-07 DIAGNOSIS — S42209A Unspecified fracture of upper end of unspecified humerus, initial encounter for closed fracture: Secondary | ICD-10-CM | POA: Diagnosis not present

## 2012-04-07 DIAGNOSIS — S52599A Other fractures of lower end of unspecified radius, initial encounter for closed fracture: Secondary | ICD-10-CM | POA: Diagnosis not present

## 2012-04-07 DIAGNOSIS — M6281 Muscle weakness (generalized): Secondary | ICD-10-CM | POA: Diagnosis not present

## 2012-04-07 DIAGNOSIS — R5381 Other malaise: Secondary | ICD-10-CM | POA: Diagnosis not present

## 2012-04-07 DIAGNOSIS — I10 Essential (primary) hypertension: Secondary | ICD-10-CM | POA: Diagnosis not present

## 2012-04-07 DIAGNOSIS — F0280 Dementia in other diseases classified elsewhere without behavioral disturbance: Secondary | ICD-10-CM | POA: Diagnosis not present

## 2012-04-07 DIAGNOSIS — R269 Unspecified abnormalities of gait and mobility: Secondary | ICD-10-CM | POA: Diagnosis not present

## 2012-04-07 DIAGNOSIS — M2550 Pain in unspecified joint: Secondary | ICD-10-CM | POA: Diagnosis not present

## 2012-04-07 DIAGNOSIS — F411 Generalized anxiety disorder: Secondary | ICD-10-CM | POA: Diagnosis not present

## 2012-04-07 DIAGNOSIS — K5900 Constipation, unspecified: Secondary | ICD-10-CM | POA: Diagnosis not present

## 2012-04-07 DIAGNOSIS — G20 Parkinson's disease: Secondary | ICD-10-CM | POA: Diagnosis not present

## 2012-04-07 HISTORY — DX: Dementia in other diseases classified elsewhere, unspecified severity, without behavioral disturbance, psychotic disturbance, mood disturbance, and anxiety: F02.80

## 2012-04-08 DIAGNOSIS — R269 Unspecified abnormalities of gait and mobility: Secondary | ICD-10-CM | POA: Diagnosis not present

## 2012-04-08 DIAGNOSIS — F8089 Other developmental disorders of speech and language: Secondary | ICD-10-CM | POA: Diagnosis not present

## 2012-04-08 DIAGNOSIS — G20 Parkinson's disease: Secondary | ICD-10-CM | POA: Diagnosis not present

## 2012-04-08 DIAGNOSIS — M84429A Pathological fracture, unspecified humerus, initial encounter for fracture: Secondary | ICD-10-CM | POA: Diagnosis not present

## 2012-04-08 DIAGNOSIS — M6281 Muscle weakness (generalized): Secondary | ICD-10-CM | POA: Diagnosis not present

## 2012-04-08 DIAGNOSIS — M2550 Pain in unspecified joint: Secondary | ICD-10-CM | POA: Diagnosis not present

## 2012-04-10 DIAGNOSIS — F8089 Other developmental disorders of speech and language: Secondary | ICD-10-CM | POA: Diagnosis not present

## 2012-04-10 DIAGNOSIS — R269 Unspecified abnormalities of gait and mobility: Secondary | ICD-10-CM | POA: Diagnosis not present

## 2012-04-10 DIAGNOSIS — M6281 Muscle weakness (generalized): Secondary | ICD-10-CM | POA: Diagnosis not present

## 2012-04-10 DIAGNOSIS — M2550 Pain in unspecified joint: Secondary | ICD-10-CM | POA: Diagnosis not present

## 2012-04-10 DIAGNOSIS — G20 Parkinson's disease: Secondary | ICD-10-CM | POA: Diagnosis not present

## 2012-04-10 DIAGNOSIS — M84429A Pathological fracture, unspecified humerus, initial encounter for fracture: Secondary | ICD-10-CM | POA: Diagnosis not present

## 2012-04-11 DIAGNOSIS — M84429A Pathological fracture, unspecified humerus, initial encounter for fracture: Secondary | ICD-10-CM | POA: Diagnosis not present

## 2012-04-11 DIAGNOSIS — R269 Unspecified abnormalities of gait and mobility: Secondary | ICD-10-CM | POA: Diagnosis not present

## 2012-04-11 DIAGNOSIS — G20 Parkinson's disease: Secondary | ICD-10-CM | POA: Diagnosis not present

## 2012-04-11 DIAGNOSIS — M6281 Muscle weakness (generalized): Secondary | ICD-10-CM | POA: Diagnosis not present

## 2012-04-11 DIAGNOSIS — I359 Nonrheumatic aortic valve disorder, unspecified: Secondary | ICD-10-CM

## 2012-04-11 DIAGNOSIS — M2550 Pain in unspecified joint: Secondary | ICD-10-CM | POA: Diagnosis not present

## 2012-04-11 DIAGNOSIS — F8089 Other developmental disorders of speech and language: Secondary | ICD-10-CM | POA: Diagnosis not present

## 2012-04-11 HISTORY — DX: Nonrheumatic aortic valve disorder, unspecified: I35.9

## 2012-04-12 DIAGNOSIS — F8089 Other developmental disorders of speech and language: Secondary | ICD-10-CM | POA: Diagnosis not present

## 2012-04-12 DIAGNOSIS — G20 Parkinson's disease: Secondary | ICD-10-CM | POA: Diagnosis not present

## 2012-04-12 DIAGNOSIS — R269 Unspecified abnormalities of gait and mobility: Secondary | ICD-10-CM | POA: Diagnosis not present

## 2012-04-12 DIAGNOSIS — M84429A Pathological fracture, unspecified humerus, initial encounter for fracture: Secondary | ICD-10-CM | POA: Diagnosis not present

## 2012-04-12 DIAGNOSIS — M6281 Muscle weakness (generalized): Secondary | ICD-10-CM | POA: Diagnosis not present

## 2012-04-12 DIAGNOSIS — M2550 Pain in unspecified joint: Secondary | ICD-10-CM | POA: Diagnosis not present

## 2012-04-13 DIAGNOSIS — M2550 Pain in unspecified joint: Secondary | ICD-10-CM | POA: Diagnosis not present

## 2012-04-13 DIAGNOSIS — M84429A Pathological fracture, unspecified humerus, initial encounter for fracture: Secondary | ICD-10-CM | POA: Diagnosis not present

## 2012-04-13 DIAGNOSIS — G20 Parkinson's disease: Secondary | ICD-10-CM | POA: Diagnosis not present

## 2012-04-13 DIAGNOSIS — F8089 Other developmental disorders of speech and language: Secondary | ICD-10-CM | POA: Diagnosis not present

## 2012-04-13 DIAGNOSIS — M6281 Muscle weakness (generalized): Secondary | ICD-10-CM | POA: Diagnosis not present

## 2012-04-13 DIAGNOSIS — R269 Unspecified abnormalities of gait and mobility: Secondary | ICD-10-CM | POA: Diagnosis not present

## 2012-04-14 DIAGNOSIS — G20 Parkinson's disease: Secondary | ICD-10-CM | POA: Diagnosis not present

## 2012-04-14 DIAGNOSIS — M6281 Muscle weakness (generalized): Secondary | ICD-10-CM | POA: Diagnosis not present

## 2012-04-14 DIAGNOSIS — M2550 Pain in unspecified joint: Secondary | ICD-10-CM | POA: Diagnosis not present

## 2012-04-14 DIAGNOSIS — F8089 Other developmental disorders of speech and language: Secondary | ICD-10-CM | POA: Diagnosis not present

## 2012-04-14 DIAGNOSIS — R269 Unspecified abnormalities of gait and mobility: Secondary | ICD-10-CM | POA: Diagnosis not present

## 2012-04-14 DIAGNOSIS — M84429A Pathological fracture, unspecified humerus, initial encounter for fracture: Secondary | ICD-10-CM | POA: Diagnosis not present

## 2012-04-15 DIAGNOSIS — M84429A Pathological fracture, unspecified humerus, initial encounter for fracture: Secondary | ICD-10-CM | POA: Diagnosis not present

## 2012-04-15 DIAGNOSIS — R269 Unspecified abnormalities of gait and mobility: Secondary | ICD-10-CM | POA: Diagnosis not present

## 2012-04-15 DIAGNOSIS — M2550 Pain in unspecified joint: Secondary | ICD-10-CM | POA: Diagnosis not present

## 2012-04-15 DIAGNOSIS — F8089 Other developmental disorders of speech and language: Secondary | ICD-10-CM | POA: Diagnosis not present

## 2012-04-15 DIAGNOSIS — G20 Parkinson's disease: Secondary | ICD-10-CM | POA: Diagnosis not present

## 2012-04-15 DIAGNOSIS — M6281 Muscle weakness (generalized): Secondary | ICD-10-CM | POA: Diagnosis not present

## 2012-04-16 DIAGNOSIS — R269 Unspecified abnormalities of gait and mobility: Secondary | ICD-10-CM | POA: Diagnosis not present

## 2012-04-16 DIAGNOSIS — M84429A Pathological fracture, unspecified humerus, initial encounter for fracture: Secondary | ICD-10-CM | POA: Diagnosis not present

## 2012-04-16 DIAGNOSIS — G20 Parkinson's disease: Secondary | ICD-10-CM | POA: Diagnosis not present

## 2012-04-16 DIAGNOSIS — F8089 Other developmental disorders of speech and language: Secondary | ICD-10-CM | POA: Diagnosis not present

## 2012-04-16 DIAGNOSIS — M2550 Pain in unspecified joint: Secondary | ICD-10-CM | POA: Diagnosis not present

## 2012-04-16 DIAGNOSIS — M6281 Muscle weakness (generalized): Secondary | ICD-10-CM | POA: Diagnosis not present

## 2012-04-18 DIAGNOSIS — G20 Parkinson's disease: Secondary | ICD-10-CM | POA: Diagnosis not present

## 2012-04-18 DIAGNOSIS — R269 Unspecified abnormalities of gait and mobility: Secondary | ICD-10-CM | POA: Diagnosis not present

## 2012-04-18 DIAGNOSIS — M84429A Pathological fracture, unspecified humerus, initial encounter for fracture: Secondary | ICD-10-CM | POA: Diagnosis not present

## 2012-04-18 DIAGNOSIS — M6281 Muscle weakness (generalized): Secondary | ICD-10-CM | POA: Diagnosis not present

## 2012-04-18 DIAGNOSIS — M2550 Pain in unspecified joint: Secondary | ICD-10-CM | POA: Diagnosis not present

## 2012-04-18 DIAGNOSIS — F8089 Other developmental disorders of speech and language: Secondary | ICD-10-CM | POA: Diagnosis not present

## 2012-04-19 DIAGNOSIS — M6281 Muscle weakness (generalized): Secondary | ICD-10-CM | POA: Diagnosis not present

## 2012-04-19 DIAGNOSIS — R269 Unspecified abnormalities of gait and mobility: Secondary | ICD-10-CM | POA: Diagnosis not present

## 2012-04-19 DIAGNOSIS — F8089 Other developmental disorders of speech and language: Secondary | ICD-10-CM | POA: Diagnosis not present

## 2012-04-19 DIAGNOSIS — G20 Parkinson's disease: Secondary | ICD-10-CM | POA: Diagnosis not present

## 2012-04-19 DIAGNOSIS — M84429A Pathological fracture, unspecified humerus, initial encounter for fracture: Secondary | ICD-10-CM | POA: Diagnosis not present

## 2012-04-19 DIAGNOSIS — M2550 Pain in unspecified joint: Secondary | ICD-10-CM | POA: Diagnosis not present

## 2012-04-20 DIAGNOSIS — M2550 Pain in unspecified joint: Secondary | ICD-10-CM | POA: Diagnosis not present

## 2012-04-20 DIAGNOSIS — M6281 Muscle weakness (generalized): Secondary | ICD-10-CM | POA: Diagnosis not present

## 2012-04-20 DIAGNOSIS — R269 Unspecified abnormalities of gait and mobility: Secondary | ICD-10-CM | POA: Diagnosis not present

## 2012-04-20 DIAGNOSIS — M84429A Pathological fracture, unspecified humerus, initial encounter for fracture: Secondary | ICD-10-CM | POA: Diagnosis not present

## 2012-04-20 DIAGNOSIS — F8089 Other developmental disorders of speech and language: Secondary | ICD-10-CM | POA: Diagnosis not present

## 2012-04-20 DIAGNOSIS — G20 Parkinson's disease: Secondary | ICD-10-CM | POA: Diagnosis not present

## 2012-04-21 DIAGNOSIS — M84429A Pathological fracture, unspecified humerus, initial encounter for fracture: Secondary | ICD-10-CM | POA: Diagnosis not present

## 2012-04-21 DIAGNOSIS — M6281 Muscle weakness (generalized): Secondary | ICD-10-CM | POA: Diagnosis not present

## 2012-04-21 DIAGNOSIS — R269 Unspecified abnormalities of gait and mobility: Secondary | ICD-10-CM | POA: Diagnosis not present

## 2012-04-21 DIAGNOSIS — G20 Parkinson's disease: Secondary | ICD-10-CM | POA: Diagnosis not present

## 2012-04-21 DIAGNOSIS — M2550 Pain in unspecified joint: Secondary | ICD-10-CM | POA: Diagnosis not present

## 2012-04-21 DIAGNOSIS — F8089 Other developmental disorders of speech and language: Secondary | ICD-10-CM | POA: Diagnosis not present

## 2012-04-22 DIAGNOSIS — M84429A Pathological fracture, unspecified humerus, initial encounter for fracture: Secondary | ICD-10-CM | POA: Diagnosis not present

## 2012-04-22 DIAGNOSIS — M2550 Pain in unspecified joint: Secondary | ICD-10-CM | POA: Diagnosis not present

## 2012-04-22 DIAGNOSIS — G20 Parkinson's disease: Secondary | ICD-10-CM | POA: Diagnosis not present

## 2012-04-22 DIAGNOSIS — M6281 Muscle weakness (generalized): Secondary | ICD-10-CM | POA: Diagnosis not present

## 2012-04-22 DIAGNOSIS — F8089 Other developmental disorders of speech and language: Secondary | ICD-10-CM | POA: Diagnosis not present

## 2012-04-22 DIAGNOSIS — R269 Unspecified abnormalities of gait and mobility: Secondary | ICD-10-CM | POA: Diagnosis not present

## 2012-04-22 DIAGNOSIS — S42209A Unspecified fracture of upper end of unspecified humerus, initial encounter for closed fracture: Secondary | ICD-10-CM | POA: Diagnosis not present

## 2012-04-25 DIAGNOSIS — G20 Parkinson's disease: Secondary | ICD-10-CM | POA: Diagnosis not present

## 2012-04-25 DIAGNOSIS — M2550 Pain in unspecified joint: Secondary | ICD-10-CM | POA: Diagnosis not present

## 2012-04-25 DIAGNOSIS — Z9181 History of falling: Secondary | ICD-10-CM | POA: Diagnosis not present

## 2012-04-25 DIAGNOSIS — M6281 Muscle weakness (generalized): Secondary | ICD-10-CM | POA: Diagnosis not present

## 2012-04-25 DIAGNOSIS — R279 Unspecified lack of coordination: Secondary | ICD-10-CM | POA: Diagnosis not present

## 2012-04-25 DIAGNOSIS — M84429A Pathological fracture, unspecified humerus, initial encounter for fracture: Secondary | ICD-10-CM | POA: Diagnosis not present

## 2012-04-25 DIAGNOSIS — R609 Edema, unspecified: Secondary | ICD-10-CM | POA: Diagnosis not present

## 2012-04-25 DIAGNOSIS — Z4789 Encounter for other orthopedic aftercare: Secondary | ICD-10-CM | POA: Diagnosis not present

## 2012-04-25 DIAGNOSIS — R269 Unspecified abnormalities of gait and mobility: Secondary | ICD-10-CM | POA: Diagnosis not present

## 2012-04-26 DIAGNOSIS — R279 Unspecified lack of coordination: Secondary | ICD-10-CM | POA: Diagnosis not present

## 2012-04-26 DIAGNOSIS — G20 Parkinson's disease: Secondary | ICD-10-CM | POA: Diagnosis not present

## 2012-04-26 DIAGNOSIS — R269 Unspecified abnormalities of gait and mobility: Secondary | ICD-10-CM | POA: Diagnosis not present

## 2012-04-26 DIAGNOSIS — M6281 Muscle weakness (generalized): Secondary | ICD-10-CM | POA: Diagnosis not present

## 2012-04-26 DIAGNOSIS — M2550 Pain in unspecified joint: Secondary | ICD-10-CM | POA: Diagnosis not present

## 2012-04-26 DIAGNOSIS — M84429A Pathological fracture, unspecified humerus, initial encounter for fracture: Secondary | ICD-10-CM | POA: Diagnosis not present

## 2012-04-27 DIAGNOSIS — G20 Parkinson's disease: Secondary | ICD-10-CM | POA: Diagnosis not present

## 2012-04-27 DIAGNOSIS — M84429A Pathological fracture, unspecified humerus, initial encounter for fracture: Secondary | ICD-10-CM | POA: Diagnosis not present

## 2012-04-27 DIAGNOSIS — R279 Unspecified lack of coordination: Secondary | ICD-10-CM | POA: Diagnosis not present

## 2012-04-27 DIAGNOSIS — M2550 Pain in unspecified joint: Secondary | ICD-10-CM | POA: Diagnosis not present

## 2012-04-27 DIAGNOSIS — M6281 Muscle weakness (generalized): Secondary | ICD-10-CM | POA: Diagnosis not present

## 2012-04-27 DIAGNOSIS — R269 Unspecified abnormalities of gait and mobility: Secondary | ICD-10-CM | POA: Diagnosis not present

## 2012-04-28 DIAGNOSIS — R269 Unspecified abnormalities of gait and mobility: Secondary | ICD-10-CM | POA: Diagnosis not present

## 2012-04-28 DIAGNOSIS — M84429A Pathological fracture, unspecified humerus, initial encounter for fracture: Secondary | ICD-10-CM | POA: Diagnosis not present

## 2012-04-28 DIAGNOSIS — M6281 Muscle weakness (generalized): Secondary | ICD-10-CM | POA: Diagnosis not present

## 2012-04-28 DIAGNOSIS — R279 Unspecified lack of coordination: Secondary | ICD-10-CM | POA: Diagnosis not present

## 2012-04-28 DIAGNOSIS — M2550 Pain in unspecified joint: Secondary | ICD-10-CM | POA: Diagnosis not present

## 2012-04-28 DIAGNOSIS — G20 Parkinson's disease: Secondary | ICD-10-CM | POA: Diagnosis not present

## 2012-04-29 DIAGNOSIS — M84429A Pathological fracture, unspecified humerus, initial encounter for fracture: Secondary | ICD-10-CM | POA: Diagnosis not present

## 2012-04-29 DIAGNOSIS — M2550 Pain in unspecified joint: Secondary | ICD-10-CM | POA: Diagnosis not present

## 2012-04-29 DIAGNOSIS — R269 Unspecified abnormalities of gait and mobility: Secondary | ICD-10-CM | POA: Diagnosis not present

## 2012-04-29 DIAGNOSIS — R279 Unspecified lack of coordination: Secondary | ICD-10-CM | POA: Diagnosis not present

## 2012-04-29 DIAGNOSIS — M6281 Muscle weakness (generalized): Secondary | ICD-10-CM | POA: Diagnosis not present

## 2012-04-29 DIAGNOSIS — G20 Parkinson's disease: Secondary | ICD-10-CM | POA: Diagnosis not present

## 2012-05-01 DIAGNOSIS — M6281 Muscle weakness (generalized): Secondary | ICD-10-CM | POA: Diagnosis not present

## 2012-05-01 DIAGNOSIS — M2550 Pain in unspecified joint: Secondary | ICD-10-CM | POA: Diagnosis not present

## 2012-05-01 DIAGNOSIS — R279 Unspecified lack of coordination: Secondary | ICD-10-CM | POA: Diagnosis not present

## 2012-05-01 DIAGNOSIS — M84429A Pathological fracture, unspecified humerus, initial encounter for fracture: Secondary | ICD-10-CM | POA: Diagnosis not present

## 2012-05-01 DIAGNOSIS — G20 Parkinson's disease: Secondary | ICD-10-CM | POA: Diagnosis not present

## 2012-05-01 DIAGNOSIS — R269 Unspecified abnormalities of gait and mobility: Secondary | ICD-10-CM | POA: Diagnosis not present

## 2012-05-02 DIAGNOSIS — M2550 Pain in unspecified joint: Secondary | ICD-10-CM | POA: Diagnosis not present

## 2012-05-02 DIAGNOSIS — R279 Unspecified lack of coordination: Secondary | ICD-10-CM | POA: Diagnosis not present

## 2012-05-02 DIAGNOSIS — M84429A Pathological fracture, unspecified humerus, initial encounter for fracture: Secondary | ICD-10-CM | POA: Diagnosis not present

## 2012-05-02 DIAGNOSIS — M6281 Muscle weakness (generalized): Secondary | ICD-10-CM | POA: Diagnosis not present

## 2012-05-02 DIAGNOSIS — R269 Unspecified abnormalities of gait and mobility: Secondary | ICD-10-CM | POA: Diagnosis not present

## 2012-05-02 DIAGNOSIS — G20 Parkinson's disease: Secondary | ICD-10-CM | POA: Diagnosis not present

## 2012-05-03 DIAGNOSIS — R269 Unspecified abnormalities of gait and mobility: Secondary | ICD-10-CM | POA: Diagnosis not present

## 2012-05-03 DIAGNOSIS — M2550 Pain in unspecified joint: Secondary | ICD-10-CM | POA: Diagnosis not present

## 2012-05-03 DIAGNOSIS — G20 Parkinson's disease: Secondary | ICD-10-CM | POA: Diagnosis not present

## 2012-05-03 DIAGNOSIS — R279 Unspecified lack of coordination: Secondary | ICD-10-CM | POA: Diagnosis not present

## 2012-05-03 DIAGNOSIS — M84429A Pathological fracture, unspecified humerus, initial encounter for fracture: Secondary | ICD-10-CM | POA: Diagnosis not present

## 2012-05-03 DIAGNOSIS — M6281 Muscle weakness (generalized): Secondary | ICD-10-CM | POA: Diagnosis not present

## 2012-05-04 DIAGNOSIS — M6281 Muscle weakness (generalized): Secondary | ICD-10-CM | POA: Diagnosis not present

## 2012-05-04 DIAGNOSIS — R269 Unspecified abnormalities of gait and mobility: Secondary | ICD-10-CM | POA: Diagnosis not present

## 2012-05-04 DIAGNOSIS — M2550 Pain in unspecified joint: Secondary | ICD-10-CM | POA: Diagnosis not present

## 2012-05-04 DIAGNOSIS — R279 Unspecified lack of coordination: Secondary | ICD-10-CM | POA: Diagnosis not present

## 2012-05-04 DIAGNOSIS — M84429A Pathological fracture, unspecified humerus, initial encounter for fracture: Secondary | ICD-10-CM | POA: Diagnosis not present

## 2012-05-04 DIAGNOSIS — G20 Parkinson's disease: Secondary | ICD-10-CM | POA: Diagnosis not present

## 2012-05-06 DIAGNOSIS — G20 Parkinson's disease: Secondary | ICD-10-CM | POA: Diagnosis not present

## 2012-05-06 DIAGNOSIS — M2550 Pain in unspecified joint: Secondary | ICD-10-CM | POA: Diagnosis not present

## 2012-05-06 DIAGNOSIS — M6281 Muscle weakness (generalized): Secondary | ICD-10-CM | POA: Diagnosis not present

## 2012-05-06 DIAGNOSIS — S42309A Unspecified fracture of shaft of humerus, unspecified arm, initial encounter for closed fracture: Secondary | ICD-10-CM | POA: Diagnosis not present

## 2012-05-06 DIAGNOSIS — R279 Unspecified lack of coordination: Secondary | ICD-10-CM | POA: Diagnosis not present

## 2012-05-06 DIAGNOSIS — R269 Unspecified abnormalities of gait and mobility: Secondary | ICD-10-CM | POA: Diagnosis not present

## 2012-05-06 DIAGNOSIS — M84429A Pathological fracture, unspecified humerus, initial encounter for fracture: Secondary | ICD-10-CM | POA: Diagnosis not present

## 2012-05-07 DIAGNOSIS — R269 Unspecified abnormalities of gait and mobility: Secondary | ICD-10-CM | POA: Diagnosis not present

## 2012-05-07 DIAGNOSIS — M84429A Pathological fracture, unspecified humerus, initial encounter for fracture: Secondary | ICD-10-CM | POA: Diagnosis not present

## 2012-05-07 DIAGNOSIS — M2550 Pain in unspecified joint: Secondary | ICD-10-CM | POA: Diagnosis not present

## 2012-05-07 DIAGNOSIS — G20 Parkinson's disease: Secondary | ICD-10-CM | POA: Diagnosis not present

## 2012-05-07 DIAGNOSIS — R279 Unspecified lack of coordination: Secondary | ICD-10-CM | POA: Diagnosis not present

## 2012-05-07 DIAGNOSIS — M6281 Muscle weakness (generalized): Secondary | ICD-10-CM | POA: Diagnosis not present

## 2012-05-09 DIAGNOSIS — M6281 Muscle weakness (generalized): Secondary | ICD-10-CM | POA: Diagnosis not present

## 2012-05-09 DIAGNOSIS — M84429A Pathological fracture, unspecified humerus, initial encounter for fracture: Secondary | ICD-10-CM | POA: Diagnosis not present

## 2012-05-09 DIAGNOSIS — M2550 Pain in unspecified joint: Secondary | ICD-10-CM | POA: Diagnosis not present

## 2012-05-09 DIAGNOSIS — R279 Unspecified lack of coordination: Secondary | ICD-10-CM | POA: Diagnosis not present

## 2012-05-09 DIAGNOSIS — G20 Parkinson's disease: Secondary | ICD-10-CM | POA: Diagnosis not present

## 2012-05-09 DIAGNOSIS — R269 Unspecified abnormalities of gait and mobility: Secondary | ICD-10-CM | POA: Diagnosis not present

## 2012-05-10 DIAGNOSIS — F0280 Dementia in other diseases classified elsewhere without behavioral disturbance: Secondary | ICD-10-CM | POA: Diagnosis not present

## 2012-05-10 DIAGNOSIS — R269 Unspecified abnormalities of gait and mobility: Secondary | ICD-10-CM | POA: Diagnosis not present

## 2012-05-10 DIAGNOSIS — S42209A Unspecified fracture of upper end of unspecified humerus, initial encounter for closed fracture: Secondary | ICD-10-CM | POA: Diagnosis not present

## 2012-05-10 DIAGNOSIS — S52599A Other fractures of lower end of unspecified radius, initial encounter for closed fracture: Secondary | ICD-10-CM | POA: Diagnosis not present

## 2012-05-10 DIAGNOSIS — G20 Parkinson's disease: Secondary | ICD-10-CM | POA: Diagnosis not present

## 2012-05-10 DIAGNOSIS — K5900 Constipation, unspecified: Secondary | ICD-10-CM | POA: Diagnosis not present

## 2012-05-10 DIAGNOSIS — R5381 Other malaise: Secondary | ICD-10-CM | POA: Diagnosis not present

## 2012-05-10 DIAGNOSIS — R279 Unspecified lack of coordination: Secondary | ICD-10-CM | POA: Diagnosis not present

## 2012-05-10 DIAGNOSIS — M84429A Pathological fracture, unspecified humerus, initial encounter for fracture: Secondary | ICD-10-CM | POA: Diagnosis not present

## 2012-05-10 DIAGNOSIS — M2550 Pain in unspecified joint: Secondary | ICD-10-CM | POA: Diagnosis not present

## 2012-05-10 DIAGNOSIS — M6281 Muscle weakness (generalized): Secondary | ICD-10-CM | POA: Diagnosis not present

## 2012-05-11 DIAGNOSIS — M2550 Pain in unspecified joint: Secondary | ICD-10-CM | POA: Diagnosis not present

## 2012-05-11 DIAGNOSIS — M84429A Pathological fracture, unspecified humerus, initial encounter for fracture: Secondary | ICD-10-CM | POA: Diagnosis not present

## 2012-05-11 DIAGNOSIS — R279 Unspecified lack of coordination: Secondary | ICD-10-CM | POA: Diagnosis not present

## 2012-05-11 DIAGNOSIS — R269 Unspecified abnormalities of gait and mobility: Secondary | ICD-10-CM | POA: Diagnosis not present

## 2012-05-11 DIAGNOSIS — M6281 Muscle weakness (generalized): Secondary | ICD-10-CM | POA: Diagnosis not present

## 2012-05-12 DIAGNOSIS — M2550 Pain in unspecified joint: Secondary | ICD-10-CM | POA: Diagnosis not present

## 2012-05-12 DIAGNOSIS — M6281 Muscle weakness (generalized): Secondary | ICD-10-CM | POA: Diagnosis not present

## 2012-05-12 DIAGNOSIS — R279 Unspecified lack of coordination: Secondary | ICD-10-CM | POA: Diagnosis not present

## 2012-05-12 DIAGNOSIS — G20 Parkinson's disease: Secondary | ICD-10-CM | POA: Diagnosis not present

## 2012-05-12 DIAGNOSIS — R269 Unspecified abnormalities of gait and mobility: Secondary | ICD-10-CM | POA: Diagnosis not present

## 2012-05-13 DIAGNOSIS — R279 Unspecified lack of coordination: Secondary | ICD-10-CM | POA: Diagnosis not present

## 2012-05-13 DIAGNOSIS — M2550 Pain in unspecified joint: Secondary | ICD-10-CM | POA: Diagnosis not present

## 2012-05-13 DIAGNOSIS — M84429A Pathological fracture, unspecified humerus, initial encounter for fracture: Secondary | ICD-10-CM | POA: Diagnosis not present

## 2012-05-13 DIAGNOSIS — M6281 Muscle weakness (generalized): Secondary | ICD-10-CM | POA: Diagnosis not present

## 2012-05-13 DIAGNOSIS — G20 Parkinson's disease: Secondary | ICD-10-CM | POA: Diagnosis not present

## 2012-05-13 DIAGNOSIS — R269 Unspecified abnormalities of gait and mobility: Secondary | ICD-10-CM | POA: Diagnosis not present

## 2012-05-16 DIAGNOSIS — M84429A Pathological fracture, unspecified humerus, initial encounter for fracture: Secondary | ICD-10-CM | POA: Diagnosis not present

## 2012-05-16 DIAGNOSIS — R269 Unspecified abnormalities of gait and mobility: Secondary | ICD-10-CM | POA: Diagnosis not present

## 2012-05-16 DIAGNOSIS — M6281 Muscle weakness (generalized): Secondary | ICD-10-CM | POA: Diagnosis not present

## 2012-05-16 DIAGNOSIS — G20 Parkinson's disease: Secondary | ICD-10-CM | POA: Diagnosis not present

## 2012-05-16 DIAGNOSIS — R279 Unspecified lack of coordination: Secondary | ICD-10-CM | POA: Diagnosis not present

## 2012-05-17 DIAGNOSIS — R269 Unspecified abnormalities of gait and mobility: Secondary | ICD-10-CM | POA: Diagnosis not present

## 2012-05-17 DIAGNOSIS — R279 Unspecified lack of coordination: Secondary | ICD-10-CM | POA: Diagnosis not present

## 2012-05-17 DIAGNOSIS — M6281 Muscle weakness (generalized): Secondary | ICD-10-CM | POA: Diagnosis not present

## 2012-05-17 DIAGNOSIS — G20 Parkinson's disease: Secondary | ICD-10-CM | POA: Diagnosis not present

## 2012-05-17 DIAGNOSIS — M2550 Pain in unspecified joint: Secondary | ICD-10-CM | POA: Diagnosis not present

## 2012-05-17 DIAGNOSIS — M84429A Pathological fracture, unspecified humerus, initial encounter for fracture: Secondary | ICD-10-CM | POA: Diagnosis not present

## 2012-05-18 DIAGNOSIS — R279 Unspecified lack of coordination: Secondary | ICD-10-CM | POA: Diagnosis not present

## 2012-05-18 DIAGNOSIS — M84429A Pathological fracture, unspecified humerus, initial encounter for fracture: Secondary | ICD-10-CM | POA: Diagnosis not present

## 2012-05-18 DIAGNOSIS — G20 Parkinson's disease: Secondary | ICD-10-CM | POA: Diagnosis not present

## 2012-05-18 DIAGNOSIS — M6281 Muscle weakness (generalized): Secondary | ICD-10-CM | POA: Diagnosis not present

## 2012-05-18 DIAGNOSIS — M2550 Pain in unspecified joint: Secondary | ICD-10-CM | POA: Diagnosis not present

## 2012-05-18 DIAGNOSIS — R269 Unspecified abnormalities of gait and mobility: Secondary | ICD-10-CM | POA: Diagnosis not present

## 2012-05-19 DIAGNOSIS — R279 Unspecified lack of coordination: Secondary | ICD-10-CM | POA: Diagnosis not present

## 2012-05-19 DIAGNOSIS — M84429A Pathological fracture, unspecified humerus, initial encounter for fracture: Secondary | ICD-10-CM | POA: Diagnosis not present

## 2012-05-19 DIAGNOSIS — R269 Unspecified abnormalities of gait and mobility: Secondary | ICD-10-CM | POA: Diagnosis not present

## 2012-05-19 DIAGNOSIS — M2550 Pain in unspecified joint: Secondary | ICD-10-CM | POA: Diagnosis not present

## 2012-05-20 DIAGNOSIS — S42209A Unspecified fracture of upper end of unspecified humerus, initial encounter for closed fracture: Secondary | ICD-10-CM | POA: Diagnosis not present

## 2012-05-20 DIAGNOSIS — G20 Parkinson's disease: Secondary | ICD-10-CM | POA: Diagnosis not present

## 2012-05-20 DIAGNOSIS — F0280 Dementia in other diseases classified elsewhere without behavioral disturbance: Secondary | ICD-10-CM | POA: Diagnosis not present

## 2012-05-20 DIAGNOSIS — S52599A Other fractures of lower end of unspecified radius, initial encounter for closed fracture: Secondary | ICD-10-CM | POA: Diagnosis not present

## 2012-05-20 DIAGNOSIS — R5381 Other malaise: Secondary | ICD-10-CM | POA: Diagnosis not present

## 2012-05-23 DIAGNOSIS — R609 Edema, unspecified: Secondary | ICD-10-CM | POA: Diagnosis not present

## 2012-05-23 DIAGNOSIS — M84429A Pathological fracture, unspecified humerus, initial encounter for fracture: Secondary | ICD-10-CM | POA: Diagnosis not present

## 2012-05-23 DIAGNOSIS — M2550 Pain in unspecified joint: Secondary | ICD-10-CM | POA: Diagnosis not present

## 2012-05-23 DIAGNOSIS — R279 Unspecified lack of coordination: Secondary | ICD-10-CM | POA: Diagnosis not present

## 2012-05-23 DIAGNOSIS — G20 Parkinson's disease: Secondary | ICD-10-CM | POA: Diagnosis not present

## 2012-05-23 DIAGNOSIS — Z9181 History of falling: Secondary | ICD-10-CM | POA: Diagnosis not present

## 2012-05-23 DIAGNOSIS — Z4789 Encounter for other orthopedic aftercare: Secondary | ICD-10-CM | POA: Diagnosis not present

## 2012-05-23 DIAGNOSIS — R269 Unspecified abnormalities of gait and mobility: Secondary | ICD-10-CM | POA: Diagnosis not present

## 2012-05-23 DIAGNOSIS — M6281 Muscle weakness (generalized): Secondary | ICD-10-CM | POA: Diagnosis not present

## 2012-05-24 DIAGNOSIS — M6281 Muscle weakness (generalized): Secondary | ICD-10-CM | POA: Diagnosis not present

## 2012-05-25 DIAGNOSIS — M2550 Pain in unspecified joint: Secondary | ICD-10-CM | POA: Diagnosis not present

## 2012-05-25 DIAGNOSIS — G20 Parkinson's disease: Secondary | ICD-10-CM | POA: Diagnosis not present

## 2012-05-25 DIAGNOSIS — M84429A Pathological fracture, unspecified humerus, initial encounter for fracture: Secondary | ICD-10-CM | POA: Diagnosis not present

## 2012-05-25 DIAGNOSIS — R279 Unspecified lack of coordination: Secondary | ICD-10-CM | POA: Diagnosis not present

## 2012-05-25 DIAGNOSIS — R269 Unspecified abnormalities of gait and mobility: Secondary | ICD-10-CM | POA: Diagnosis not present

## 2012-05-26 DIAGNOSIS — R269 Unspecified abnormalities of gait and mobility: Secondary | ICD-10-CM | POA: Diagnosis not present

## 2012-05-26 DIAGNOSIS — M2550 Pain in unspecified joint: Secondary | ICD-10-CM | POA: Diagnosis not present

## 2012-05-26 DIAGNOSIS — G20 Parkinson's disease: Secondary | ICD-10-CM | POA: Diagnosis not present

## 2012-05-26 DIAGNOSIS — R279 Unspecified lack of coordination: Secondary | ICD-10-CM | POA: Diagnosis not present

## 2012-05-26 DIAGNOSIS — M6281 Muscle weakness (generalized): Secondary | ICD-10-CM | POA: Diagnosis not present

## 2012-05-27 DIAGNOSIS — R279 Unspecified lack of coordination: Secondary | ICD-10-CM | POA: Diagnosis not present

## 2012-05-27 DIAGNOSIS — R269 Unspecified abnormalities of gait and mobility: Secondary | ICD-10-CM | POA: Diagnosis not present

## 2012-05-27 DIAGNOSIS — G20 Parkinson's disease: Secondary | ICD-10-CM | POA: Diagnosis not present

## 2012-05-27 DIAGNOSIS — M6281 Muscle weakness (generalized): Secondary | ICD-10-CM | POA: Diagnosis not present

## 2012-05-27 DIAGNOSIS — M2550 Pain in unspecified joint: Secondary | ICD-10-CM | POA: Diagnosis not present

## 2012-05-30 DIAGNOSIS — R269 Unspecified abnormalities of gait and mobility: Secondary | ICD-10-CM | POA: Diagnosis not present

## 2012-05-31 DIAGNOSIS — R269 Unspecified abnormalities of gait and mobility: Secondary | ICD-10-CM | POA: Diagnosis not present

## 2012-06-01 DIAGNOSIS — M2550 Pain in unspecified joint: Secondary | ICD-10-CM | POA: Diagnosis not present

## 2012-06-01 DIAGNOSIS — R279 Unspecified lack of coordination: Secondary | ICD-10-CM | POA: Diagnosis not present

## 2012-06-01 DIAGNOSIS — M6281 Muscle weakness (generalized): Secondary | ICD-10-CM | POA: Diagnosis not present

## 2012-06-02 DIAGNOSIS — M84429A Pathological fracture, unspecified humerus, initial encounter for fracture: Secondary | ICD-10-CM | POA: Diagnosis not present

## 2012-06-02 DIAGNOSIS — R279 Unspecified lack of coordination: Secondary | ICD-10-CM | POA: Diagnosis not present

## 2012-06-02 DIAGNOSIS — R269 Unspecified abnormalities of gait and mobility: Secondary | ICD-10-CM | POA: Diagnosis not present

## 2012-06-02 DIAGNOSIS — M2550 Pain in unspecified joint: Secondary | ICD-10-CM | POA: Diagnosis not present

## 2012-06-02 DIAGNOSIS — M6281 Muscle weakness (generalized): Secondary | ICD-10-CM | POA: Diagnosis not present

## 2012-06-03 DIAGNOSIS — R279 Unspecified lack of coordination: Secondary | ICD-10-CM | POA: Diagnosis not present

## 2012-06-03 DIAGNOSIS — M2550 Pain in unspecified joint: Secondary | ICD-10-CM | POA: Diagnosis not present

## 2012-06-03 DIAGNOSIS — G20 Parkinson's disease: Secondary | ICD-10-CM | POA: Diagnosis not present

## 2012-06-03 DIAGNOSIS — M6281 Muscle weakness (generalized): Secondary | ICD-10-CM | POA: Diagnosis not present

## 2012-06-03 DIAGNOSIS — M84429A Pathological fracture, unspecified humerus, initial encounter for fracture: Secondary | ICD-10-CM | POA: Diagnosis not present

## 2012-06-03 DIAGNOSIS — R269 Unspecified abnormalities of gait and mobility: Secondary | ICD-10-CM | POA: Diagnosis not present

## 2012-06-06 DIAGNOSIS — R279 Unspecified lack of coordination: Secondary | ICD-10-CM | POA: Diagnosis not present

## 2012-06-06 DIAGNOSIS — M84429A Pathological fracture, unspecified humerus, initial encounter for fracture: Secondary | ICD-10-CM | POA: Diagnosis not present

## 2012-06-06 DIAGNOSIS — S42209A Unspecified fracture of upper end of unspecified humerus, initial encounter for closed fracture: Secondary | ICD-10-CM | POA: Diagnosis not present

## 2012-06-06 DIAGNOSIS — R269 Unspecified abnormalities of gait and mobility: Secondary | ICD-10-CM | POA: Diagnosis not present

## 2012-06-06 DIAGNOSIS — S52599A Other fractures of lower end of unspecified radius, initial encounter for closed fracture: Secondary | ICD-10-CM | POA: Diagnosis not present

## 2012-06-06 DIAGNOSIS — M2550 Pain in unspecified joint: Secondary | ICD-10-CM | POA: Diagnosis not present

## 2012-06-06 DIAGNOSIS — M6281 Muscle weakness (generalized): Secondary | ICD-10-CM | POA: Diagnosis not present

## 2012-06-06 DIAGNOSIS — F0280 Dementia in other diseases classified elsewhere without behavioral disturbance: Secondary | ICD-10-CM | POA: Diagnosis not present

## 2012-06-07 DIAGNOSIS — R269 Unspecified abnormalities of gait and mobility: Secondary | ICD-10-CM | POA: Diagnosis not present

## 2012-06-08 DIAGNOSIS — R279 Unspecified lack of coordination: Secondary | ICD-10-CM | POA: Diagnosis not present

## 2012-06-08 DIAGNOSIS — M6281 Muscle weakness (generalized): Secondary | ICD-10-CM | POA: Diagnosis not present

## 2012-06-08 DIAGNOSIS — M2550 Pain in unspecified joint: Secondary | ICD-10-CM | POA: Diagnosis not present

## 2012-06-08 DIAGNOSIS — G20 Parkinson's disease: Secondary | ICD-10-CM | POA: Diagnosis not present

## 2012-06-08 DIAGNOSIS — R269 Unspecified abnormalities of gait and mobility: Secondary | ICD-10-CM | POA: Diagnosis not present

## 2012-06-09 ENCOUNTER — Encounter: Payer: Self-pay | Admitting: Neurology

## 2012-06-09 ENCOUNTER — Ambulatory Visit (INDEPENDENT_AMBULATORY_CARE_PROVIDER_SITE_OTHER): Payer: Medicare Other | Admitting: Neurology

## 2012-06-09 VITALS — BP 126/72 | HR 58 | Temp 98.6°F | Ht 60.0 in | Wt 109.0 lb

## 2012-06-09 DIAGNOSIS — R413 Other amnesia: Secondary | ICD-10-CM

## 2012-06-09 DIAGNOSIS — G20 Parkinson's disease: Secondary | ICD-10-CM | POA: Diagnosis not present

## 2012-06-09 DIAGNOSIS — M84429A Pathological fracture, unspecified humerus, initial encounter for fracture: Secondary | ICD-10-CM | POA: Diagnosis not present

## 2012-06-09 DIAGNOSIS — M2550 Pain in unspecified joint: Secondary | ICD-10-CM | POA: Diagnosis not present

## 2012-06-09 DIAGNOSIS — M6281 Muscle weakness (generalized): Secondary | ICD-10-CM | POA: Diagnosis not present

## 2012-06-09 DIAGNOSIS — R279 Unspecified lack of coordination: Secondary | ICD-10-CM | POA: Diagnosis not present

## 2012-06-09 DIAGNOSIS — R269 Unspecified abnormalities of gait and mobility: Secondary | ICD-10-CM | POA: Diagnosis not present

## 2012-06-09 HISTORY — DX: Other amnesia: R41.3

## 2012-06-09 NOTE — Progress Notes (Signed)
Subjective:    Patient ID: Vanessa Mcbride is a 79 y.o. female.  HPI Interim history: Vanessa Mcbride is a very pleasant 79 y.o.-year-old R-handed female, with an underlying history of hyperlipidemia, parkinsonism, hypertension, osteoarthritis, insomnia, memory loss, and anxiety, who presents for followup consultation of Parkinson's disease. The patient is accompanied by one of her caretakers, Analyn, today and I first met the patient on 05/06/2012, at which time I suggested increasing Sinemet CR 25/100 mg strength to one tablet 3 times a day. She is taking this at 8 AM, 1 PM and 6 PM with success. I did review a followup note from her occupational therapist. She has had great progress in her left arm which was the one she fracture due to a fall. Her orthopedic surgeon cleared her to perform activities in her left arm in all planes of movement as tolerated but still not weightbearing in this arm. She's had some left-right confusion. She has said that her left hand as dominant but she automatically signs using her right hand. She's had some complaints about her right side. She feels weaker in her right arm and at times she has had some increasing tremors in her right hand and increased bradykinesia. Sometimes she is agitated and she's had some blood pressure fluctuations. First thing in the morning it tends to be on the higher side but then it goes down. I explained to the caretaker that this may be due to the Sinemet which can lower blood pressure values. This morning it was in the 150s over 80s before her medication but then came down. She has not had any recent falls or syncopal spells. Her caretaker believes that she has done fairly well overall. Last time she was situated in a wheelchair today she has no walking aid. She still has her left wrist in a brace. But she seems to be in better spirits today and more conversant today and in less pain today.   Her  Past Medical History  Diagnosis Date   Parkinson's  disease    Stroke    Hypertension    Arthritis     osteoarthritis   Scoliosis     Her  Past Surgical History  Procedure Laterality Date   Breast surgery      benign lumpectomy    Her No family history on file.  Her  History   Social History   Marital Status: Married    Spouse Name: N/A    Number of Children: N/A   Years of Education: N/A   Social History Main Topics   Smoking status: Never Smoker    Smokeless tobacco: Never Used   Alcohol Use: No   Drug Use: No   Sexually Active:    Other Topics Concern   None   Social History Narrative   None    Allergies  Allergen Reactions   Iodine Anaphylaxis   Iohexol      Desc: pt has had contrast in the past (years ago) and it causea anaphylaxis- per pt's daughter.  stephanie davis,rtrct, Onset Date: [id number]   :   Outpatient Encounter Prescriptions as of 06/09/2012  Medication Sig Dispense Refill   amLODipine (NORVASC) 5 MG tablet Take 2.5 mg by mouth at bedtime.        aspirin 81 MG tablet Take 81 mg by mouth daily.       carbidopa-levodopa (SINEMET IR) 25-100 MG per tablet Take 1 tablet by mouth 2 (two) times daily.  docusate sodium 100 MG CAPS Take 100 mg by mouth 2 (two) times daily.  60 capsule  1   escitalopram (LEXAPRO) 5 MG tablet Take 5 mg by mouth every morning.       ibuprofen (ADVIL,MOTRIN) 400 MG tablet        l-methylfolate-b2-b6-b12 (CEREFOLIN) 08-21-48-5 MG TABS Take 1 tablet by mouth daily.       Melatonin 10 MG TABS Take 1 tablet by mouth at bedtime as needed. sleep       metoprolol succinate (TOPROL-XL) 25 MG 24 hr tablet Take 12.5 mg by mouth at bedtime.       Multiple Vitamin (MULTIVITAMIN WITH MINERALS) TABS Take 1 tablet by mouth at bedtime.       simvastatin (ZOCOR) 10 MG tablet Take 10 mg by mouth at bedtime.       oxyCODONE-acetaminophen (PERCOCET) 5-325 MG per tablet Take 1-2 tablets by mouth every 4 (four) hours as needed for pain (max: 3g of acetaminophen  in 24hrs period).  30 tablet  0   No facility-administered encounter medications on file as of 06/09/2012.  :     The following portions of the patient's history were reviewed and updated as appropriate: allergies, current medications, past family history, past medical history, past social history, past surgical history and problem list.  Review of Systems  Genitourinary: Negative.   Neurological: Positive for tremors.  Psychiatric/Behavioral: Positive for confusion and sleep disturbance (Sometimes wakes shortly after falling asep).    Objective:  Neurologic Exam  Physical Exam  Physical Examination:   Filed Vitals:   06/09/12 1248  BP: 126/72  Pulse: 58  Temp: 98.6 F (37 C)    On general exam: She is a very sweet frail-appearing elderly lady sitting comfortably in her chair in no apparent distress. Left forearm is in a brace. She is [place] in origin and speaks with an accent. She is able to answer all questions appropriately. She is a little slow in responding but does answer appropriately and is able to give a fairly good history. HEENT exam: Normocephalic, atraumatic, mild facial masking is noted. She has no lip, neck or jaw tremor. Neck tone is mildly elevated. She has normal range of motion. Hearing is intact. Speech is otherwise clear. Oropharynx exam reveals no abnormalities. Pupils are reactive to light and extraocular tracking is fair with mild saccadic breakdown of smooth pursuit. Chest is clear to auscultation with no wheezing or rhonchi. Heart sounds are normal without murmurs, rubs or gallops. Abdomen is soft, nontender. Bowel sounds are appreciated. She has no pitting edema today. Neurologically: Mental status: The patient is awake, alert and oriented to self, and circumstance. Cranial nerves are as described under HEENT exam. Motor exam: She has a thin bulk. Strength is normal for age. Her left arm mobility is better. Tone is increased on the right side with mild  cogwheeling noted in the  right upper extremity. She has a fairly normal tone on the left with mild increase in tone noted in the left lower extremity. She does not have much in the way of tremors today. She has significant slowness today. She has moderate to severe impairment of finger taps and hand movements on the right, this is slightly better on the left. She has moderate impairment of foot taps on the right and mild to moderate impairment of foot taps on the left. She is able to stand up from the seated position and does take a couple of attempts but is able  to do this on her own for the most part with very minimal elbow assistance provided by her caretaker. This is improved from last time. She continues to have a mild to moderately stooped posture. She walks with a mild shuffle but no assistance today which is again improved from last time. She does have a decreased stride length and almost absent arm swings bilaterally. She turns in 3 steps. Reflexes are 1+ in the upper extremity on the right and 2+ in the lower extremities. Sensory exam is intact to light touch throughout.   Assessment and plan:    In summary, Ms. Heathcock is an 79 year old lady with a history and physical exam consistent with Parkinson's disease, right predominant with overall moderate findings but some improvement from last time. I told her that I believe she has done a little bit better and that blood pressure fluctuations are often seen in Parkinson's patients. This can be due to medication on the underlying disease. It is not atypical for her to have initially an elevated blood pressure and after taking the Sinemet it can go down. I do believe that we need to monitor this but not change anything at this time. Since it has only been a month since her last visit with me I would like to watch her and continue her on the same regimen for now and see her back in 3 months. I discussed my findings with her caretaker and herself today and  suggested that they continue with outpatient occupational therapy. I've encouraged them to call with any interim questions, concerns, problems or updates. I asked the caretaker also call for refill requests. They were in agreement.

## 2012-06-09 NOTE — Patient Instructions (Signed)
I think overall as compared to a month ago you are doing better. I would like to keep you on the Sinemet CR 25/100 mg strength one tablet 3 times a day as we are currently. Continue with occupational therapy as you are. Her left arm definitely looks improved but he still has stiffness and significant slowness in her right arm but I think we need to give it some more time. I would like to see you back in 3 months, sooner if the need arises. Have your nursing staff call us for refills or other questions or concerns. You have to keep in mind that even though you were much more independent before you fall your body has taken a significant setback due to the fall and when something like this happens Parkinson's symptoms do get worse and take a while to get better and unfortunately there is no guarantee that he will reach her previous baseline. Look forward and continue with treatment and therapy and I am sure you will be doing better.

## 2012-06-10 DIAGNOSIS — M6281 Muscle weakness (generalized): Secondary | ICD-10-CM | POA: Diagnosis not present

## 2012-06-10 DIAGNOSIS — R269 Unspecified abnormalities of gait and mobility: Secondary | ICD-10-CM | POA: Diagnosis not present

## 2012-06-10 DIAGNOSIS — M84429A Pathological fracture, unspecified humerus, initial encounter for fracture: Secondary | ICD-10-CM | POA: Diagnosis not present

## 2012-06-10 DIAGNOSIS — R279 Unspecified lack of coordination: Secondary | ICD-10-CM | POA: Diagnosis not present

## 2012-06-10 DIAGNOSIS — M2550 Pain in unspecified joint: Secondary | ICD-10-CM | POA: Diagnosis not present

## 2012-06-10 DIAGNOSIS — G20 Parkinson's disease: Secondary | ICD-10-CM | POA: Diagnosis not present

## 2012-06-13 DIAGNOSIS — M6281 Muscle weakness (generalized): Secondary | ICD-10-CM | POA: Diagnosis not present

## 2012-06-13 DIAGNOSIS — M2550 Pain in unspecified joint: Secondary | ICD-10-CM | POA: Diagnosis not present

## 2012-06-13 DIAGNOSIS — R279 Unspecified lack of coordination: Secondary | ICD-10-CM | POA: Diagnosis not present

## 2012-06-13 DIAGNOSIS — R269 Unspecified abnormalities of gait and mobility: Secondary | ICD-10-CM | POA: Diagnosis not present

## 2012-06-13 DIAGNOSIS — M84429A Pathological fracture, unspecified humerus, initial encounter for fracture: Secondary | ICD-10-CM | POA: Diagnosis not present

## 2012-06-13 DIAGNOSIS — G20 Parkinson's disease: Secondary | ICD-10-CM | POA: Diagnosis not present

## 2012-06-15 DIAGNOSIS — G20 Parkinson's disease: Secondary | ICD-10-CM | POA: Diagnosis not present

## 2012-06-15 DIAGNOSIS — M6281 Muscle weakness (generalized): Secondary | ICD-10-CM | POA: Diagnosis not present

## 2012-06-15 DIAGNOSIS — R269 Unspecified abnormalities of gait and mobility: Secondary | ICD-10-CM | POA: Diagnosis not present

## 2012-06-15 DIAGNOSIS — M84429A Pathological fracture, unspecified humerus, initial encounter for fracture: Secondary | ICD-10-CM | POA: Diagnosis not present

## 2012-06-15 DIAGNOSIS — M2550 Pain in unspecified joint: Secondary | ICD-10-CM | POA: Diagnosis not present

## 2012-06-15 DIAGNOSIS — R279 Unspecified lack of coordination: Secondary | ICD-10-CM | POA: Diagnosis not present

## 2012-06-16 DIAGNOSIS — M84429A Pathological fracture, unspecified humerus, initial encounter for fracture: Secondary | ICD-10-CM | POA: Diagnosis not present

## 2012-06-16 DIAGNOSIS — M6281 Muscle weakness (generalized): Secondary | ICD-10-CM | POA: Diagnosis not present

## 2012-06-16 DIAGNOSIS — R269 Unspecified abnormalities of gait and mobility: Secondary | ICD-10-CM | POA: Diagnosis not present

## 2012-06-16 DIAGNOSIS — R279 Unspecified lack of coordination: Secondary | ICD-10-CM | POA: Diagnosis not present

## 2012-06-16 DIAGNOSIS — G20 Parkinson's disease: Secondary | ICD-10-CM | POA: Diagnosis not present

## 2012-06-16 DIAGNOSIS — M2550 Pain in unspecified joint: Secondary | ICD-10-CM | POA: Diagnosis not present

## 2012-06-17 DIAGNOSIS — G20 Parkinson's disease: Secondary | ICD-10-CM | POA: Diagnosis not present

## 2012-06-17 DIAGNOSIS — R279 Unspecified lack of coordination: Secondary | ICD-10-CM | POA: Diagnosis not present

## 2012-06-17 DIAGNOSIS — M6281 Muscle weakness (generalized): Secondary | ICD-10-CM | POA: Diagnosis not present

## 2012-06-17 DIAGNOSIS — S42209A Unspecified fracture of upper end of unspecified humerus, initial encounter for closed fracture: Secondary | ICD-10-CM | POA: Diagnosis not present

## 2012-06-17 DIAGNOSIS — R269 Unspecified abnormalities of gait and mobility: Secondary | ICD-10-CM | POA: Diagnosis not present

## 2012-06-17 DIAGNOSIS — M2550 Pain in unspecified joint: Secondary | ICD-10-CM | POA: Diagnosis not present

## 2012-06-17 DIAGNOSIS — M84429A Pathological fracture, unspecified humerus, initial encounter for fracture: Secondary | ICD-10-CM | POA: Diagnosis not present

## 2012-06-19 DIAGNOSIS — R279 Unspecified lack of coordination: Secondary | ICD-10-CM | POA: Diagnosis not present

## 2012-06-19 DIAGNOSIS — G20 Parkinson's disease: Secondary | ICD-10-CM | POA: Diagnosis not present

## 2012-06-19 DIAGNOSIS — R269 Unspecified abnormalities of gait and mobility: Secondary | ICD-10-CM | POA: Diagnosis not present

## 2012-06-19 DIAGNOSIS — M6281 Muscle weakness (generalized): Secondary | ICD-10-CM | POA: Diagnosis not present

## 2012-06-19 DIAGNOSIS — M84429A Pathological fracture, unspecified humerus, initial encounter for fracture: Secondary | ICD-10-CM | POA: Diagnosis not present

## 2012-06-19 DIAGNOSIS — M2550 Pain in unspecified joint: Secondary | ICD-10-CM | POA: Diagnosis not present

## 2012-06-20 DIAGNOSIS — M2550 Pain in unspecified joint: Secondary | ICD-10-CM | POA: Diagnosis not present

## 2012-06-20 DIAGNOSIS — G20 Parkinson's disease: Secondary | ICD-10-CM | POA: Diagnosis not present

## 2012-06-20 DIAGNOSIS — R269 Unspecified abnormalities of gait and mobility: Secondary | ICD-10-CM | POA: Diagnosis not present

## 2012-06-20 DIAGNOSIS — M6281 Muscle weakness (generalized): Secondary | ICD-10-CM | POA: Diagnosis not present

## 2012-06-20 DIAGNOSIS — R279 Unspecified lack of coordination: Secondary | ICD-10-CM | POA: Diagnosis not present

## 2012-06-20 DIAGNOSIS — M84429A Pathological fracture, unspecified humerus, initial encounter for fracture: Secondary | ICD-10-CM | POA: Diagnosis not present

## 2012-06-21 DIAGNOSIS — M6281 Muscle weakness (generalized): Secondary | ICD-10-CM | POA: Diagnosis not present

## 2012-06-21 DIAGNOSIS — Z4789 Encounter for other orthopedic aftercare: Secondary | ICD-10-CM | POA: Diagnosis not present

## 2012-06-21 DIAGNOSIS — M2550 Pain in unspecified joint: Secondary | ICD-10-CM | POA: Diagnosis not present

## 2012-06-21 DIAGNOSIS — R279 Unspecified lack of coordination: Secondary | ICD-10-CM | POA: Diagnosis not present

## 2012-06-21 DIAGNOSIS — R609 Edema, unspecified: Secondary | ICD-10-CM | POA: Diagnosis not present

## 2012-06-21 DIAGNOSIS — M84429A Pathological fracture, unspecified humerus, initial encounter for fracture: Secondary | ICD-10-CM | POA: Diagnosis not present

## 2012-07-04 ENCOUNTER — Non-Acute Institutional Stay: Payer: Medicare Other | Admitting: Internal Medicine

## 2012-07-04 ENCOUNTER — Encounter: Payer: Self-pay | Admitting: Internal Medicine

## 2012-07-04 VITALS — BP 122/58 | HR 68 | Wt 114.0 lb

## 2012-07-04 DIAGNOSIS — M25519 Pain in unspecified shoulder: Secondary | ICD-10-CM

## 2012-07-04 DIAGNOSIS — I10 Essential (primary) hypertension: Secondary | ICD-10-CM | POA: Diagnosis not present

## 2012-07-04 DIAGNOSIS — S5290XD Unspecified fracture of unspecified forearm, subsequent encounter for closed fracture with routine healing: Secondary | ICD-10-CM

## 2012-07-04 DIAGNOSIS — M1990 Unspecified osteoarthritis, unspecified site: Secondary | ICD-10-CM

## 2012-07-04 DIAGNOSIS — G4700 Insomnia, unspecified: Secondary | ICD-10-CM | POA: Diagnosis not present

## 2012-07-04 DIAGNOSIS — G20A1 Parkinson's disease without dyskinesia, without mention of fluctuations: Secondary | ICD-10-CM

## 2012-07-04 DIAGNOSIS — M25511 Pain in right shoulder: Secondary | ICD-10-CM

## 2012-07-04 DIAGNOSIS — R413 Other amnesia: Secondary | ICD-10-CM

## 2012-07-04 DIAGNOSIS — S42301D Unspecified fracture of shaft of humerus, right arm, subsequent encounter for fracture with routine healing: Secondary | ICD-10-CM

## 2012-07-04 DIAGNOSIS — S52502D Unspecified fracture of the lower end of left radius, subsequent encounter for closed fracture with routine healing: Secondary | ICD-10-CM

## 2012-07-04 DIAGNOSIS — S42309D Unspecified fracture of shaft of humerus, unspecified arm, subsequent encounter for fracture with routine healing: Secondary | ICD-10-CM

## 2012-07-04 DIAGNOSIS — G20 Parkinson's disease: Secondary | ICD-10-CM

## 2012-07-12 DIAGNOSIS — M1990 Unspecified osteoarthritis, unspecified site: Secondary | ICD-10-CM | POA: Insufficient documentation

## 2012-07-12 DIAGNOSIS — M25519 Pain in unspecified shoulder: Secondary | ICD-10-CM | POA: Insufficient documentation

## 2012-07-12 DIAGNOSIS — G4700 Insomnia, unspecified: Secondary | ICD-10-CM | POA: Insufficient documentation

## 2012-07-12 MED ORDER — DICLOFENAC SODIUM 75 MG PO TBEC: DELAYED_RELEASE_TABLET | ORAL | Status: DC

## 2012-07-12 NOTE — Patient Instructions (Signed)
Discontinued amlodipine. Try diclofenac 50 mg twice daily and supplement with acetaminophen for pain in the shoulder. Stop ibuprofen.

## 2012-07-12 NOTE — Progress Notes (Signed)
Subjective:    Patient ID: Vanessa Mcbride, female    DOB: 08-17-1924, 79 y.o.   MRN: [id number]  HPI Patient was recently released from [facility] to go to the assisted living area. She is having a apartment prepared for her to live independently". Her daughter is with her at today's visit.  Patient was sent to the rehabilitation unit for a left radius fracture and a right humerus fracture. She is out of splints and casts present time.  She complains of pain in the right shoulder any worse.  Patient's memory is getting worse. Her daughter says that she is losing her words both in [place] and her native language she cannot dress herself because of arthritis.  Blood pressure is running 122/58. She denies any dizziness when standing but there is a noticeable instability.    Review of Systems  Constitutional: Negative for fever, chills, diaphoresis, activity change, appetite change and unexpected weight change.  HENT: Negative.   Eyes: Negative.   Respiratory: Negative.   Cardiovascular: Negative.   Gastrointestinal: Negative.   Endocrine: Negative.   Genitourinary: Negative.   Musculoskeletal:       Right shoulder pain. Generalized arthralgias. Recent fracture of the left radius and of the right humerus.  Skin: Negative.   Neurological: Positive for tremors. Negative for seizures, syncope, facial asymmetry, speech difficulty, weakness, numbness and headaches.       Loss of memory. So far this appears to be reasonably mild. Patient denies focal weakness.  Hematological: Negative.   Psychiatric/Behavioral: Negative.        Objective:   Physical Exam  Constitutional:  Frail elderly female in no acute distress.  HENT:  Head: Normocephalic and atraumatic.  Right Ear: External ear normal.  Left Ear: External ear normal.  Nose: Nose normal.  Eyes: Conjunctivae and EOM are normal. Pupils are equal, round, and reactive to light.  Neck: Normal range of motion. Neck  supple. No JVD present. No tracheal deviation present. No thyromegaly present.  Cardiovascular: Normal rate, regular rhythm, normal heart sounds and intact distal pulses.  Exam reveals no gallop and no friction rub.   No murmur heard. Pulmonary/Chest: Effort normal and breath sounds normal. No respiratory distress. She has no wheezes. She has no rales. She exhibits no tenderness.  Abdominal: Soft. Bowel sounds are normal. She exhibits no distension and no mass. There is no tenderness.  Musculoskeletal:  Tender in the mid upper half of the right humerus. Tender at the left wrist. No specific swelling or erythema at either of these areas. Gait appears to be slightly unstable. There is some difficulty with raising the right arm at the shoulder.  Lymphadenopathy:    She has no cervical adenopathy.  Neurological:  Mild memory loss. Cranial nerves intact no specific problems with coordination. She does have a tremor when arms are extended period. She has previously been diagnosed as having Parkinson's disease.  Skin: Skin is warm and dry. No rash noted. No erythema. No pallor.  Psychiatric: She has a normal mood and affect. Her behavior is normal. Thought content normal.          Assessment & Plan:  Osteoarthrosis, unspecified whether generalized or localized, unspecified site  Generalized osteoarthritis. No inflamed joints. Pain in joint, shoulder region, right   patient complains of increased pain in the right shoulder joint. She has been having physical therapy. Insomnia, unspecified  Improved. HTN (hypertension)  Blood pressure is running relatively low. We will continue metoprolol but discontinued her  amlodipine. Parkinson disease  Mild tremor at rest. Humerus fracture, right, with routine healing, subsequent encounter  Healing. This injury is likely contributing to the right shoulder discomfort. Distal radius fracture, left, closed, with routine healing, subsequent  encounter  Healing. Some loss of mobility of the left wrist. Memory loss  Mild this time. She should have followup and then the [facility] at her next visit.  Return in late June for appointment with Maxwell Marion, NP. She will return to see me in July/August.

## 2012-07-20 ENCOUNTER — Non-Acute Institutional Stay: Payer: Medicare Other | Admitting: Geriatric Medicine

## 2012-07-20 ENCOUNTER — Encounter: Payer: Self-pay | Admitting: Geriatric Medicine

## 2012-07-20 VITALS — BP 142/68 | HR 56 | Wt 118.0 lb

## 2012-07-20 DIAGNOSIS — M79609 Pain in unspecified limb: Secondary | ICD-10-CM | POA: Diagnosis not present

## 2012-07-20 DIAGNOSIS — M79601 Pain in right arm: Secondary | ICD-10-CM

## 2012-07-20 DIAGNOSIS — Z299 Encounter for prophylactic measures, unspecified: Secondary | ICD-10-CM

## 2012-07-20 MED ORDER — ACETAMINOPHEN 325 MG PO TABS: 650.0000 mg | ORAL_TABLET | Freq: Three times a day (TID) | ORAL | Status: DC

## 2012-07-20 NOTE — Progress Notes (Signed)
Patient ID: Vanessa Mcbride, female   DOB: March 25, 1924, 79 y.o.   MRN: [id number] Chief Complaint  Patient presents with  . Medical Managment of Chronic Issues    left shoulder pain worse    HPI: This 79 year old female resident [facility], Assisted Living section, returns to clinic today due to worsening right arm pain.. OT has right provided me with an overview of the problem over the last couple of weeks. Patient has been more resistant to performing ADLs in the morning, has exhibited decreased range of motion in her right arm as a whole. Patient has been declining some doses of topical Voltaren (p.o. Voltaren was prescribed at last visit but patient had adverse effect of GI disturbance), does not ask for a p.r.n. dose of Tylenol. When Tylenol is given it appears to provide relief.  Allergies Reviewed   Medications Reviewed   Review of Systems   DATA OBTAINED: from patient, medical record, caregiver. GENERAL:    No fevers, fatigue. No change in appetite, weight or sleep. SKIN: No itch, rash or open wounds RESPIRATORY: No cough, wheezing, SOB CARDIAC: No chest pain, palpitations. No edema. GI: No abdominal pain. No N/V/D or constipation. No heartburn or reflux.  MUSCULOSKELETAL: Bilateral arm discomfort right worse than left.  Gait is unsteady. No recent falls.  NEUROLOGIC: No dizziness, fainting, headache,. No change in mental status.  PSYCHIATRIC: Anxiety present.  Sleeps well.   Physical Exam  Filed Vitals:   07/20/12 1153  BP: 142/68  Pulse: 56   Filed Weights   07/20/12 1153  Weight: 118 lb (53.524 kg)   GENERAL APPEARANCE: No acute distress, appropriately groomed, normal body habitus. Alert, pleasant, conversant. HEAD: Normocephalic, atraumatic EYES: Conjunctiva/lids clear.  EARS: Hearing grossly normal. LYMPHATICS: No head, neck adenopathy. Enlarged left supraclavicular clavicular node; soft nontender movable (has been present for many years per daughter's  report.) RESPIRATORY: Breathing is even, unlabored. Lung sounds are clear and full.  CARDIOVASCULAR: Heart RRR. No murmur or extra heart sounds  EDEMA: No peripheral edema.  MUSCULOSKELETAL: Decreased range of motion in both shoulders, cannot lift arms past about 90 in front or to the side. No tenderness in left shoulder joint left upper traps or left deltoid. Tenderness present right upper trapezius right deltoid extending to the right forearm    PSYCHIATRIC: Mood and affect appropriate to situation  ASSESSMENT/PLAN  Pain, arm, right Increasing right arm pain and decreasing ADLs, significant tenderness in right upper traps as well as right deltoid tenderness does extend down into the muscles of the forearm.  Patient is not in understanding the use of topical Voltaren gel, daughter will reinforce this medicine with her. Will restart scheduled Tylenol, this medication seems to provide relief. Will also discuss with OT possibly directing therapies at muscle tension in the right upper traps and deltoid.   Follow up: As scheduled  Neveyah Garzon T.Kimbely Whiteaker, NP-C 07/20/2012

## 2012-07-20 NOTE — Assessment & Plan Note (Addendum)
Increasing right arm pain and decreasing ADLs, significant tenderness in right upper traps as well as right deltoid tenderness does extend down into the muscles of the forearm.  Patient is not in understanding the use of topical Voltaren gel, daughter will reinforce this medicine with her. Will restart scheduled Tylenol, this medication seems to provide relief. Will also discuss with OT possibly directing therapies at muscle tension in the right upper traps and deltoid.

## 2012-07-21 DIAGNOSIS — I69920 Aphasia following unspecified cerebrovascular disease: Secondary | ICD-10-CM | POA: Diagnosis not present

## 2012-07-21 DIAGNOSIS — I69928 Other speech and language deficits following unspecified cerebrovascular disease: Secondary | ICD-10-CM | POA: Diagnosis not present

## 2012-07-21 DIAGNOSIS — M6281 Muscle weakness (generalized): Secondary | ICD-10-CM | POA: Diagnosis not present

## 2012-07-21 DIAGNOSIS — R279 Unspecified lack of coordination: Secondary | ICD-10-CM | POA: Diagnosis not present

## 2012-07-21 DIAGNOSIS — Z4789 Encounter for other orthopedic aftercare: Secondary | ICD-10-CM | POA: Diagnosis not present

## 2012-07-21 DIAGNOSIS — R488 Other symbolic dysfunctions: Secondary | ICD-10-CM | POA: Diagnosis not present

## 2012-07-21 DIAGNOSIS — R609 Edema, unspecified: Secondary | ICD-10-CM | POA: Diagnosis not present

## 2012-07-21 DIAGNOSIS — M84429A Pathological fracture, unspecified humerus, initial encounter for fracture: Secondary | ICD-10-CM | POA: Diagnosis not present

## 2012-07-21 DIAGNOSIS — G20 Parkinson's disease: Secondary | ICD-10-CM | POA: Diagnosis not present

## 2012-07-21 DIAGNOSIS — M2550 Pain in unspecified joint: Secondary | ICD-10-CM | POA: Diagnosis not present

## 2012-07-22 DIAGNOSIS — M2550 Pain in unspecified joint: Secondary | ICD-10-CM | POA: Diagnosis not present

## 2012-07-22 DIAGNOSIS — I69928 Other speech and language deficits following unspecified cerebrovascular disease: Secondary | ICD-10-CM | POA: Diagnosis not present

## 2012-07-22 DIAGNOSIS — G20 Parkinson's disease: Secondary | ICD-10-CM | POA: Diagnosis not present

## 2012-07-22 DIAGNOSIS — I69920 Aphasia following unspecified cerebrovascular disease: Secondary | ICD-10-CM | POA: Diagnosis not present

## 2012-07-22 DIAGNOSIS — M6281 Muscle weakness (generalized): Secondary | ICD-10-CM | POA: Diagnosis not present

## 2012-07-22 DIAGNOSIS — M84429A Pathological fracture, unspecified humerus, initial encounter for fracture: Secondary | ICD-10-CM | POA: Diagnosis not present

## 2012-07-25 DIAGNOSIS — M6281 Muscle weakness (generalized): Secondary | ICD-10-CM | POA: Diagnosis not present

## 2012-07-25 DIAGNOSIS — G20 Parkinson's disease: Secondary | ICD-10-CM | POA: Diagnosis not present

## 2012-07-25 DIAGNOSIS — M84429A Pathological fracture, unspecified humerus, initial encounter for fracture: Secondary | ICD-10-CM | POA: Diagnosis not present

## 2012-07-25 DIAGNOSIS — I69928 Other speech and language deficits following unspecified cerebrovascular disease: Secondary | ICD-10-CM | POA: Diagnosis not present

## 2012-07-25 DIAGNOSIS — I69920 Aphasia following unspecified cerebrovascular disease: Secondary | ICD-10-CM | POA: Diagnosis not present

## 2012-07-25 DIAGNOSIS — M2550 Pain in unspecified joint: Secondary | ICD-10-CM | POA: Diagnosis not present

## 2012-07-26 DIAGNOSIS — I69920 Aphasia following unspecified cerebrovascular disease: Secondary | ICD-10-CM | POA: Diagnosis not present

## 2012-07-26 DIAGNOSIS — M84429A Pathological fracture, unspecified humerus, initial encounter for fracture: Secondary | ICD-10-CM | POA: Diagnosis not present

## 2012-07-26 DIAGNOSIS — I69928 Other speech and language deficits following unspecified cerebrovascular disease: Secondary | ICD-10-CM | POA: Diagnosis not present

## 2012-07-26 DIAGNOSIS — M2550 Pain in unspecified joint: Secondary | ICD-10-CM | POA: Diagnosis not present

## 2012-07-26 DIAGNOSIS — M6281 Muscle weakness (generalized): Secondary | ICD-10-CM | POA: Diagnosis not present

## 2012-07-26 DIAGNOSIS — G20 Parkinson's disease: Secondary | ICD-10-CM | POA: Diagnosis not present

## 2012-07-29 DIAGNOSIS — M6281 Muscle weakness (generalized): Secondary | ICD-10-CM | POA: Diagnosis not present

## 2012-07-29 DIAGNOSIS — M84429A Pathological fracture, unspecified humerus, initial encounter for fracture: Secondary | ICD-10-CM | POA: Diagnosis not present

## 2012-07-29 DIAGNOSIS — I69920 Aphasia following unspecified cerebrovascular disease: Secondary | ICD-10-CM | POA: Diagnosis not present

## 2012-07-29 DIAGNOSIS — G20 Parkinson's disease: Secondary | ICD-10-CM | POA: Diagnosis not present

## 2012-07-29 DIAGNOSIS — I69928 Other speech and language deficits following unspecified cerebrovascular disease: Secondary | ICD-10-CM | POA: Diagnosis not present

## 2012-07-29 DIAGNOSIS — M2550 Pain in unspecified joint: Secondary | ICD-10-CM | POA: Diagnosis not present

## 2012-08-02 DIAGNOSIS — I69920 Aphasia following unspecified cerebrovascular disease: Secondary | ICD-10-CM | POA: Diagnosis not present

## 2012-08-02 DIAGNOSIS — M2550 Pain in unspecified joint: Secondary | ICD-10-CM | POA: Diagnosis not present

## 2012-08-02 DIAGNOSIS — I69928 Other speech and language deficits following unspecified cerebrovascular disease: Secondary | ICD-10-CM | POA: Diagnosis not present

## 2012-08-02 DIAGNOSIS — M6281 Muscle weakness (generalized): Secondary | ICD-10-CM | POA: Diagnosis not present

## 2012-08-02 DIAGNOSIS — G20 Parkinson's disease: Secondary | ICD-10-CM | POA: Diagnosis not present

## 2012-08-02 DIAGNOSIS — M84429A Pathological fracture, unspecified humerus, initial encounter for fracture: Secondary | ICD-10-CM | POA: Diagnosis not present

## 2012-08-04 DIAGNOSIS — G20 Parkinson's disease: Secondary | ICD-10-CM | POA: Diagnosis not present

## 2012-08-04 DIAGNOSIS — I69928 Other speech and language deficits following unspecified cerebrovascular disease: Secondary | ICD-10-CM | POA: Diagnosis not present

## 2012-08-04 DIAGNOSIS — M2550 Pain in unspecified joint: Secondary | ICD-10-CM | POA: Diagnosis not present

## 2012-08-04 DIAGNOSIS — M84429A Pathological fracture, unspecified humerus, initial encounter for fracture: Secondary | ICD-10-CM | POA: Diagnosis not present

## 2012-08-04 DIAGNOSIS — I69920 Aphasia following unspecified cerebrovascular disease: Secondary | ICD-10-CM | POA: Diagnosis not present

## 2012-08-04 DIAGNOSIS — M6281 Muscle weakness (generalized): Secondary | ICD-10-CM | POA: Diagnosis not present

## 2012-08-05 DIAGNOSIS — I69920 Aphasia following unspecified cerebrovascular disease: Secondary | ICD-10-CM | POA: Diagnosis not present

## 2012-08-05 DIAGNOSIS — M84429A Pathological fracture, unspecified humerus, initial encounter for fracture: Secondary | ICD-10-CM | POA: Diagnosis not present

## 2012-08-05 DIAGNOSIS — G20 Parkinson's disease: Secondary | ICD-10-CM | POA: Diagnosis not present

## 2012-08-05 DIAGNOSIS — M6281 Muscle weakness (generalized): Secondary | ICD-10-CM | POA: Diagnosis not present

## 2012-08-05 DIAGNOSIS — M2550 Pain in unspecified joint: Secondary | ICD-10-CM | POA: Diagnosis not present

## 2012-08-05 DIAGNOSIS — I69928 Other speech and language deficits following unspecified cerebrovascular disease: Secondary | ICD-10-CM | POA: Diagnosis not present

## 2012-08-09 DIAGNOSIS — M2550 Pain in unspecified joint: Secondary | ICD-10-CM | POA: Diagnosis not present

## 2012-08-09 DIAGNOSIS — I69920 Aphasia following unspecified cerebrovascular disease: Secondary | ICD-10-CM | POA: Diagnosis not present

## 2012-08-09 DIAGNOSIS — M84429A Pathological fracture, unspecified humerus, initial encounter for fracture: Secondary | ICD-10-CM | POA: Diagnosis not present

## 2012-08-09 DIAGNOSIS — G20 Parkinson's disease: Secondary | ICD-10-CM | POA: Diagnosis not present

## 2012-08-09 DIAGNOSIS — I69928 Other speech and language deficits following unspecified cerebrovascular disease: Secondary | ICD-10-CM | POA: Diagnosis not present

## 2012-08-09 DIAGNOSIS — M6281 Muscle weakness (generalized): Secondary | ICD-10-CM | POA: Diagnosis not present

## 2012-08-11 DIAGNOSIS — I69928 Other speech and language deficits following unspecified cerebrovascular disease: Secondary | ICD-10-CM | POA: Diagnosis not present

## 2012-08-11 DIAGNOSIS — M6281 Muscle weakness (generalized): Secondary | ICD-10-CM | POA: Diagnosis not present

## 2012-08-11 DIAGNOSIS — I69920 Aphasia following unspecified cerebrovascular disease: Secondary | ICD-10-CM | POA: Diagnosis not present

## 2012-08-11 DIAGNOSIS — M2550 Pain in unspecified joint: Secondary | ICD-10-CM | POA: Diagnosis not present

## 2012-08-11 DIAGNOSIS — M84429A Pathological fracture, unspecified humerus, initial encounter for fracture: Secondary | ICD-10-CM | POA: Diagnosis not present

## 2012-08-11 DIAGNOSIS — G20 Parkinson's disease: Secondary | ICD-10-CM | POA: Diagnosis not present

## 2012-08-16 DIAGNOSIS — M2550 Pain in unspecified joint: Secondary | ICD-10-CM | POA: Diagnosis not present

## 2012-08-16 DIAGNOSIS — M84429A Pathological fracture, unspecified humerus, initial encounter for fracture: Secondary | ICD-10-CM | POA: Diagnosis not present

## 2012-08-16 DIAGNOSIS — G20 Parkinson's disease: Secondary | ICD-10-CM | POA: Diagnosis not present

## 2012-08-16 DIAGNOSIS — M6281 Muscle weakness (generalized): Secondary | ICD-10-CM | POA: Diagnosis not present

## 2012-08-16 DIAGNOSIS — I69920 Aphasia following unspecified cerebrovascular disease: Secondary | ICD-10-CM | POA: Diagnosis not present

## 2012-08-16 DIAGNOSIS — I69928 Other speech and language deficits following unspecified cerebrovascular disease: Secondary | ICD-10-CM | POA: Diagnosis not present

## 2012-08-17 DIAGNOSIS — M2550 Pain in unspecified joint: Secondary | ICD-10-CM | POA: Diagnosis not present

## 2012-08-17 DIAGNOSIS — G20 Parkinson's disease: Secondary | ICD-10-CM | POA: Diagnosis not present

## 2012-08-17 DIAGNOSIS — I69920 Aphasia following unspecified cerebrovascular disease: Secondary | ICD-10-CM | POA: Diagnosis not present

## 2012-08-17 DIAGNOSIS — M84429A Pathological fracture, unspecified humerus, initial encounter for fracture: Secondary | ICD-10-CM | POA: Diagnosis not present

## 2012-08-17 DIAGNOSIS — I69928 Other speech and language deficits following unspecified cerebrovascular disease: Secondary | ICD-10-CM | POA: Diagnosis not present

## 2012-08-17 DIAGNOSIS — M6281 Muscle weakness (generalized): Secondary | ICD-10-CM | POA: Diagnosis not present

## 2012-08-18 DIAGNOSIS — M6281 Muscle weakness (generalized): Secondary | ICD-10-CM | POA: Diagnosis not present

## 2012-08-18 DIAGNOSIS — M2550 Pain in unspecified joint: Secondary | ICD-10-CM | POA: Diagnosis not present

## 2012-08-18 DIAGNOSIS — I69920 Aphasia following unspecified cerebrovascular disease: Secondary | ICD-10-CM | POA: Diagnosis not present

## 2012-08-18 DIAGNOSIS — I69928 Other speech and language deficits following unspecified cerebrovascular disease: Secondary | ICD-10-CM | POA: Diagnosis not present

## 2012-08-18 DIAGNOSIS — G20 Parkinson's disease: Secondary | ICD-10-CM | POA: Diagnosis not present

## 2012-08-18 DIAGNOSIS — M84429A Pathological fracture, unspecified humerus, initial encounter for fracture: Secondary | ICD-10-CM | POA: Diagnosis not present

## 2012-08-22 DIAGNOSIS — S42209A Unspecified fracture of upper end of unspecified humerus, initial encounter for closed fracture: Secondary | ICD-10-CM | POA: Diagnosis not present

## 2012-08-23 DIAGNOSIS — I69920 Aphasia following unspecified cerebrovascular disease: Secondary | ICD-10-CM | POA: Diagnosis not present

## 2012-08-23 DIAGNOSIS — G20 Parkinson's disease: Secondary | ICD-10-CM | POA: Diagnosis not present

## 2012-08-23 DIAGNOSIS — M6281 Muscle weakness (generalized): Secondary | ICD-10-CM | POA: Diagnosis not present

## 2012-08-23 DIAGNOSIS — R609 Edema, unspecified: Secondary | ICD-10-CM | POA: Diagnosis not present

## 2012-08-23 DIAGNOSIS — R488 Other symbolic dysfunctions: Secondary | ICD-10-CM | POA: Diagnosis not present

## 2012-08-23 DIAGNOSIS — I69928 Other speech and language deficits following unspecified cerebrovascular disease: Secondary | ICD-10-CM | POA: Diagnosis not present

## 2012-08-23 DIAGNOSIS — M2550 Pain in unspecified joint: Secondary | ICD-10-CM | POA: Diagnosis not present

## 2012-08-23 DIAGNOSIS — R1312 Dysphagia, oropharyngeal phase: Secondary | ICD-10-CM | POA: Diagnosis not present

## 2012-08-23 DIAGNOSIS — Z4789 Encounter for other orthopedic aftercare: Secondary | ICD-10-CM | POA: Diagnosis not present

## 2012-08-23 DIAGNOSIS — M84429A Pathological fracture, unspecified humerus, initial encounter for fracture: Secondary | ICD-10-CM | POA: Diagnosis not present

## 2012-08-23 DIAGNOSIS — R279 Unspecified lack of coordination: Secondary | ICD-10-CM | POA: Diagnosis not present

## 2012-08-24 DIAGNOSIS — M6281 Muscle weakness (generalized): Secondary | ICD-10-CM | POA: Diagnosis not present

## 2012-08-24 DIAGNOSIS — I69920 Aphasia following unspecified cerebrovascular disease: Secondary | ICD-10-CM | POA: Diagnosis not present

## 2012-08-24 DIAGNOSIS — G20 Parkinson's disease: Secondary | ICD-10-CM | POA: Diagnosis not present

## 2012-08-24 DIAGNOSIS — M2550 Pain in unspecified joint: Secondary | ICD-10-CM | POA: Diagnosis not present

## 2012-08-24 DIAGNOSIS — I69928 Other speech and language deficits following unspecified cerebrovascular disease: Secondary | ICD-10-CM | POA: Diagnosis not present

## 2012-08-24 DIAGNOSIS — M84429A Pathological fracture, unspecified humerus, initial encounter for fracture: Secondary | ICD-10-CM | POA: Diagnosis not present

## 2012-08-25 DIAGNOSIS — M6281 Muscle weakness (generalized): Secondary | ICD-10-CM | POA: Diagnosis not present

## 2012-08-25 DIAGNOSIS — G20 Parkinson's disease: Secondary | ICD-10-CM | POA: Diagnosis not present

## 2012-08-25 DIAGNOSIS — M84429A Pathological fracture, unspecified humerus, initial encounter for fracture: Secondary | ICD-10-CM | POA: Diagnosis not present

## 2012-08-25 DIAGNOSIS — M2550 Pain in unspecified joint: Secondary | ICD-10-CM | POA: Diagnosis not present

## 2012-08-25 DIAGNOSIS — I69920 Aphasia following unspecified cerebrovascular disease: Secondary | ICD-10-CM | POA: Diagnosis not present

## 2012-08-25 DIAGNOSIS — I69928 Other speech and language deficits following unspecified cerebrovascular disease: Secondary | ICD-10-CM | POA: Diagnosis not present

## 2012-08-26 DIAGNOSIS — M84429A Pathological fracture, unspecified humerus, initial encounter for fracture: Secondary | ICD-10-CM | POA: Diagnosis not present

## 2012-08-26 DIAGNOSIS — M6281 Muscle weakness (generalized): Secondary | ICD-10-CM | POA: Diagnosis not present

## 2012-08-26 DIAGNOSIS — I69920 Aphasia following unspecified cerebrovascular disease: Secondary | ICD-10-CM | POA: Diagnosis not present

## 2012-08-26 DIAGNOSIS — G20 Parkinson's disease: Secondary | ICD-10-CM | POA: Diagnosis not present

## 2012-08-26 DIAGNOSIS — I69928 Other speech and language deficits following unspecified cerebrovascular disease: Secondary | ICD-10-CM | POA: Diagnosis not present

## 2012-08-26 DIAGNOSIS — M2550 Pain in unspecified joint: Secondary | ICD-10-CM | POA: Diagnosis not present

## 2012-08-29 DIAGNOSIS — M6281 Muscle weakness (generalized): Secondary | ICD-10-CM | POA: Diagnosis not present

## 2012-08-29 DIAGNOSIS — M2550 Pain in unspecified joint: Secondary | ICD-10-CM | POA: Diagnosis not present

## 2012-08-29 DIAGNOSIS — G20 Parkinson's disease: Secondary | ICD-10-CM | POA: Diagnosis not present

## 2012-08-29 DIAGNOSIS — I69928 Other speech and language deficits following unspecified cerebrovascular disease: Secondary | ICD-10-CM | POA: Diagnosis not present

## 2012-08-29 DIAGNOSIS — M84429A Pathological fracture, unspecified humerus, initial encounter for fracture: Secondary | ICD-10-CM | POA: Diagnosis not present

## 2012-08-29 DIAGNOSIS — I69920 Aphasia following unspecified cerebrovascular disease: Secondary | ICD-10-CM | POA: Diagnosis not present

## 2012-08-30 DIAGNOSIS — G20 Parkinson's disease: Secondary | ICD-10-CM | POA: Diagnosis not present

## 2012-08-30 DIAGNOSIS — M6281 Muscle weakness (generalized): Secondary | ICD-10-CM | POA: Diagnosis not present

## 2012-08-30 DIAGNOSIS — I69928 Other speech and language deficits following unspecified cerebrovascular disease: Secondary | ICD-10-CM | POA: Diagnosis not present

## 2012-08-30 DIAGNOSIS — M84429A Pathological fracture, unspecified humerus, initial encounter for fracture: Secondary | ICD-10-CM | POA: Diagnosis not present

## 2012-08-30 DIAGNOSIS — I69920 Aphasia following unspecified cerebrovascular disease: Secondary | ICD-10-CM | POA: Diagnosis not present

## 2012-08-30 DIAGNOSIS — M2550 Pain in unspecified joint: Secondary | ICD-10-CM | POA: Diagnosis not present

## 2012-08-31 DIAGNOSIS — M6281 Muscle weakness (generalized): Secondary | ICD-10-CM | POA: Diagnosis not present

## 2012-08-31 DIAGNOSIS — I69928 Other speech and language deficits following unspecified cerebrovascular disease: Secondary | ICD-10-CM | POA: Diagnosis not present

## 2012-08-31 DIAGNOSIS — M2550 Pain in unspecified joint: Secondary | ICD-10-CM | POA: Diagnosis not present

## 2012-08-31 DIAGNOSIS — I69920 Aphasia following unspecified cerebrovascular disease: Secondary | ICD-10-CM | POA: Diagnosis not present

## 2012-08-31 DIAGNOSIS — M84429A Pathological fracture, unspecified humerus, initial encounter for fracture: Secondary | ICD-10-CM | POA: Diagnosis not present

## 2012-08-31 DIAGNOSIS — G20 Parkinson's disease: Secondary | ICD-10-CM | POA: Diagnosis not present

## 2012-09-01 DIAGNOSIS — M2550 Pain in unspecified joint: Secondary | ICD-10-CM | POA: Diagnosis not present

## 2012-09-01 DIAGNOSIS — M84429A Pathological fracture, unspecified humerus, initial encounter for fracture: Secondary | ICD-10-CM | POA: Diagnosis not present

## 2012-09-01 DIAGNOSIS — I69920 Aphasia following unspecified cerebrovascular disease: Secondary | ICD-10-CM | POA: Diagnosis not present

## 2012-09-01 DIAGNOSIS — I69928 Other speech and language deficits following unspecified cerebrovascular disease: Secondary | ICD-10-CM | POA: Diagnosis not present

## 2012-09-01 DIAGNOSIS — M6281 Muscle weakness (generalized): Secondary | ICD-10-CM | POA: Diagnosis not present

## 2012-09-01 DIAGNOSIS — G20 Parkinson's disease: Secondary | ICD-10-CM | POA: Diagnosis not present

## 2012-09-02 DIAGNOSIS — I69928 Other speech and language deficits following unspecified cerebrovascular disease: Secondary | ICD-10-CM | POA: Diagnosis not present

## 2012-09-02 DIAGNOSIS — I69920 Aphasia following unspecified cerebrovascular disease: Secondary | ICD-10-CM | POA: Diagnosis not present

## 2012-09-02 DIAGNOSIS — G20 Parkinson's disease: Secondary | ICD-10-CM | POA: Diagnosis not present

## 2012-09-02 DIAGNOSIS — M84429A Pathological fracture, unspecified humerus, initial encounter for fracture: Secondary | ICD-10-CM | POA: Diagnosis not present

## 2012-09-02 DIAGNOSIS — M6281 Muscle weakness (generalized): Secondary | ICD-10-CM | POA: Diagnosis not present

## 2012-09-02 DIAGNOSIS — M2550 Pain in unspecified joint: Secondary | ICD-10-CM | POA: Diagnosis not present

## 2012-09-05 DIAGNOSIS — G20 Parkinson's disease: Secondary | ICD-10-CM | POA: Diagnosis not present

## 2012-09-05 DIAGNOSIS — M6281 Muscle weakness (generalized): Secondary | ICD-10-CM | POA: Diagnosis not present

## 2012-09-05 DIAGNOSIS — I69928 Other speech and language deficits following unspecified cerebrovascular disease: Secondary | ICD-10-CM | POA: Diagnosis not present

## 2012-09-05 DIAGNOSIS — I69920 Aphasia following unspecified cerebrovascular disease: Secondary | ICD-10-CM | POA: Diagnosis not present

## 2012-09-05 DIAGNOSIS — M84429A Pathological fracture, unspecified humerus, initial encounter for fracture: Secondary | ICD-10-CM | POA: Diagnosis not present

## 2012-09-05 DIAGNOSIS — M2550 Pain in unspecified joint: Secondary | ICD-10-CM | POA: Diagnosis not present

## 2012-09-06 DIAGNOSIS — M6281 Muscle weakness (generalized): Secondary | ICD-10-CM | POA: Diagnosis not present

## 2012-09-06 DIAGNOSIS — G20 Parkinson's disease: Secondary | ICD-10-CM | POA: Diagnosis not present

## 2012-09-06 DIAGNOSIS — M2550 Pain in unspecified joint: Secondary | ICD-10-CM | POA: Diagnosis not present

## 2012-09-06 DIAGNOSIS — I69920 Aphasia following unspecified cerebrovascular disease: Secondary | ICD-10-CM | POA: Diagnosis not present

## 2012-09-06 DIAGNOSIS — I69928 Other speech and language deficits following unspecified cerebrovascular disease: Secondary | ICD-10-CM | POA: Diagnosis not present

## 2012-09-06 DIAGNOSIS — M84429A Pathological fracture, unspecified humerus, initial encounter for fracture: Secondary | ICD-10-CM | POA: Diagnosis not present

## 2012-09-07 ENCOUNTER — Non-Acute Institutional Stay: Payer: Medicare Other | Admitting: Geriatric Medicine

## 2012-09-07 ENCOUNTER — Encounter: Payer: Self-pay | Admitting: Geriatric Medicine

## 2012-09-07 VITALS — BP 140/68 | HR 60 | Ht 60.0 in | Wt 122.0 lb

## 2012-09-07 DIAGNOSIS — G20 Parkinson's disease: Secondary | ICD-10-CM | POA: Diagnosis not present

## 2012-09-07 DIAGNOSIS — F411 Generalized anxiety disorder: Secondary | ICD-10-CM | POA: Insufficient documentation

## 2012-09-07 DIAGNOSIS — I69920 Aphasia following unspecified cerebrovascular disease: Secondary | ICD-10-CM | POA: Diagnosis not present

## 2012-09-07 DIAGNOSIS — M6281 Muscle weakness (generalized): Secondary | ICD-10-CM | POA: Diagnosis not present

## 2012-09-07 DIAGNOSIS — F0280 Dementia in other diseases classified elsewhere without behavioral disturbance: Secondary | ICD-10-CM | POA: Diagnosis not present

## 2012-09-07 DIAGNOSIS — M84429A Pathological fracture, unspecified humerus, initial encounter for fracture: Secondary | ICD-10-CM | POA: Diagnosis not present

## 2012-09-07 DIAGNOSIS — I69928 Other speech and language deficits following unspecified cerebrovascular disease: Secondary | ICD-10-CM | POA: Diagnosis not present

## 2012-09-07 DIAGNOSIS — I10 Essential (primary) hypertension: Secondary | ICD-10-CM

## 2012-09-07 DIAGNOSIS — M2550 Pain in unspecified joint: Secondary | ICD-10-CM | POA: Diagnosis not present

## 2012-09-07 NOTE — Assessment & Plan Note (Signed)
Continues Sinemet, ambulation stable, rt. Hand/wrist movement very stiff; limited function. Continues daily exercise. BP stable. Follow up w/ Dr.Athar as scheduled

## 2012-09-07 NOTE — Progress Notes (Signed)
Patient ID: Vanessa Mcbride, female   DOB: 10-24-1924, 79 y.o.   MRN: [id number] [facility] [phone])  Chief Complaint  Patient presents with  . Medical Managment of Chronic Issues    blood pressure, dementia, arm and shoulder pain    HPI: This is a 79 y.o. female resident of [facility],  Assisted Living section evaluated today for management of ongoing medical issues.  Review of record shows BP readings have been stable, pt. Continues to receive 24/7 personal caregiver assistance for all ADLs , has been working with OT re: upper body stretching/rt.hand exercises, ST has been working on communication issues. Pt. is participating in activities of choice, tells me she is very happy here. Pt saw Dr.Athar at [facility] re; PD, Simemet Rx, scheduled to follow up again in July.    Allergies  Allergies  Allergen Reactions  . Iodine Anaphylaxis  . Iohexol      Desc: pt has had contrast in the past (years ago) and it causea anaphylaxis- per pt's daughter.  stephanie davis,rtrct, Onset Date: [id number]    Medications  Reviewed  Data Reviewed    Hospital lab 02/2013      Review of Systems  DATA OBTAINED: from patient, medical record, caregiver GENERAL: Feels well   No fevers, fatigue, change in appetite, has gained weight SKIN: No itch, rash or open wounds EYES: No eye pain,  No change in vision EARS: No earache,  change in hearing NOSE: No congestion, drainage or bleeding MOUTH/THROAT: No mouth or tooth pain  No sore throat No difficulty chewing or swallowing RESPIRATORY: No cough, wheezing, SOB CARDIAC: No chest pain, palpitations  No edema. GI: No abdominal pain  No N/V/D or constipation  No heartburn or reflux  GU: No dysuria, frequency or urgency  No change in urine volume or character  MUSCULOSKELETAL: Rt. Hand/wrist  Stiffness, pain  No back pain    Gait is steady  No recent falls.  NEUROLOGIC: No dizziness, fainting, headache  No change  in mental status.  PSYCHIATRIC: No feelings of anxiety, depression Sleeps well.  No behavior issue.    Physical Exam Filed Vitals:   09/07/12 1350  BP: 140/68  Pulse: 60  Height: 5' (1.524 m)  Weight: 122 lb (55.339 kg)   Body mass index is 23.83 kg/(m^2).  GENERAL APPEARANCE: No acute distress, appropriately groomed, normal body habitus. Alert, pleasant, more conversant than previously. HEAD: Normocephalic, atraumatic EYES: Conjunctiva/lids clear. Pupils round, reactive.   EARS:  Hearing grossly normal. NOSE: No deformity or discharge. MOUTH/THROAT: Lips w/o lesions. Oral mucosa, tongue moist, w/o lesion. Oropharynx w/o redness or lesions.  NECK: Supple, full ROM. No thyroid tenderness, enlargement or nodule LYMPHATICS: No head, neck or supraclavicular adenopathy RESPIRATORY: Breathing is even, unlabored. Lung sounds are clear and full.  CARDIOVASCULAR: Heart RRR. No murmur or extra heart sounds  EDEMA: No peripheral edema.  GASTROINTESTINAL: Abdomen is soft, non-tender, not distended w/ normal bowel sounds. MUSCULOSKELETAL: Decreased ROM shoulders, Rt. Wrist decreased flex/extend, stiff movement of fingers.. Back is without kyphosis, scoliosis or spinal process tenderness. Gait is steady NEUROLOGIC: Speech clear, hesitant, continues with language difficulty. Rt. Hand  tremor. Marland Kitchen PSYCHIATRIC: Mood and affect appropriate to situation  ASSESSMENT/PLAN  Parkinson disease Continues Sinemet, ambulation stable, rt. Hand/wrist movement very stiff; limited function. Continues daily exercise. BP stable. Follow up w/ Dr.Athar as scheduled  Dementia in conditions classified elsewhere without behavioral disturbance(294.10) ST continues to work with pt re: expressive language- improvement noted.  No functional status change reported; pt. Continues to have 24/7 caregiver assistance  HTN (hypertension) Weekly BP range satisfactory 153-157/68-86, today 140/68. Continue current medication     Lab: 09/2012 CBC, CMP, lipids, TSH  Follow up: As scheduled Marabelle Cushman T.Tashema Tiller, NP-C 09/07/2012

## 2012-09-07 NOTE — Assessment & Plan Note (Signed)
ST continues to work with pt re: expressive language- improvement noted. No functional status change reported; pt. Continues to have 24/7 caregiver assistance

## 2012-09-07 NOTE — Assessment & Plan Note (Signed)
Weekly BP range satisfactory 153-157/68-86, today 140/68. Continue current medication

## 2012-09-09 ENCOUNTER — Ambulatory Visit: Payer: Medicare Other | Admitting: Neurology

## 2012-09-09 DIAGNOSIS — M84429A Pathological fracture, unspecified humerus, initial encounter for fracture: Secondary | ICD-10-CM | POA: Diagnosis not present

## 2012-09-09 DIAGNOSIS — M2550 Pain in unspecified joint: Secondary | ICD-10-CM | POA: Diagnosis not present

## 2012-09-09 DIAGNOSIS — M6281 Muscle weakness (generalized): Secondary | ICD-10-CM | POA: Diagnosis not present

## 2012-09-09 DIAGNOSIS — G20 Parkinson's disease: Secondary | ICD-10-CM | POA: Diagnosis not present

## 2012-09-09 DIAGNOSIS — I69920 Aphasia following unspecified cerebrovascular disease: Secondary | ICD-10-CM | POA: Diagnosis not present

## 2012-09-09 DIAGNOSIS — I69928 Other speech and language deficits following unspecified cerebrovascular disease: Secondary | ICD-10-CM | POA: Diagnosis not present

## 2012-09-12 DIAGNOSIS — I69920 Aphasia following unspecified cerebrovascular disease: Secondary | ICD-10-CM | POA: Diagnosis not present

## 2012-09-12 DIAGNOSIS — M2550 Pain in unspecified joint: Secondary | ICD-10-CM | POA: Diagnosis not present

## 2012-09-12 DIAGNOSIS — M6281 Muscle weakness (generalized): Secondary | ICD-10-CM | POA: Diagnosis not present

## 2012-09-12 DIAGNOSIS — G20 Parkinson's disease: Secondary | ICD-10-CM | POA: Diagnosis not present

## 2012-09-12 DIAGNOSIS — I69928 Other speech and language deficits following unspecified cerebrovascular disease: Secondary | ICD-10-CM | POA: Diagnosis not present

## 2012-09-12 DIAGNOSIS — M84429A Pathological fracture, unspecified humerus, initial encounter for fracture: Secondary | ICD-10-CM | POA: Diagnosis not present

## 2012-09-13 DIAGNOSIS — M2550 Pain in unspecified joint: Secondary | ICD-10-CM | POA: Diagnosis not present

## 2012-09-13 DIAGNOSIS — I69928 Other speech and language deficits following unspecified cerebrovascular disease: Secondary | ICD-10-CM | POA: Diagnosis not present

## 2012-09-13 DIAGNOSIS — I69920 Aphasia following unspecified cerebrovascular disease: Secondary | ICD-10-CM | POA: Diagnosis not present

## 2012-09-13 DIAGNOSIS — G20 Parkinson's disease: Secondary | ICD-10-CM | POA: Diagnosis not present

## 2012-09-13 DIAGNOSIS — M84429A Pathological fracture, unspecified humerus, initial encounter for fracture: Secondary | ICD-10-CM | POA: Diagnosis not present

## 2012-09-13 DIAGNOSIS — M6281 Muscle weakness (generalized): Secondary | ICD-10-CM | POA: Diagnosis not present

## 2012-09-14 DIAGNOSIS — I69928 Other speech and language deficits following unspecified cerebrovascular disease: Secondary | ICD-10-CM | POA: Diagnosis not present

## 2012-09-14 DIAGNOSIS — M2550 Pain in unspecified joint: Secondary | ICD-10-CM | POA: Diagnosis not present

## 2012-09-14 DIAGNOSIS — G20 Parkinson's disease: Secondary | ICD-10-CM | POA: Diagnosis not present

## 2012-09-14 DIAGNOSIS — M84429A Pathological fracture, unspecified humerus, initial encounter for fracture: Secondary | ICD-10-CM | POA: Diagnosis not present

## 2012-09-14 DIAGNOSIS — M6281 Muscle weakness (generalized): Secondary | ICD-10-CM | POA: Diagnosis not present

## 2012-09-14 DIAGNOSIS — I69920 Aphasia following unspecified cerebrovascular disease: Secondary | ICD-10-CM | POA: Diagnosis not present

## 2012-09-15 DIAGNOSIS — I69928 Other speech and language deficits following unspecified cerebrovascular disease: Secondary | ICD-10-CM | POA: Diagnosis not present

## 2012-09-15 DIAGNOSIS — G20 Parkinson's disease: Secondary | ICD-10-CM | POA: Diagnosis not present

## 2012-09-15 DIAGNOSIS — M6281 Muscle weakness (generalized): Secondary | ICD-10-CM | POA: Diagnosis not present

## 2012-09-15 DIAGNOSIS — M2550 Pain in unspecified joint: Secondary | ICD-10-CM | POA: Diagnosis not present

## 2012-09-15 DIAGNOSIS — M84429A Pathological fracture, unspecified humerus, initial encounter for fracture: Secondary | ICD-10-CM | POA: Diagnosis not present

## 2012-09-15 DIAGNOSIS — I69920 Aphasia following unspecified cerebrovascular disease: Secondary | ICD-10-CM | POA: Diagnosis not present

## 2012-09-16 DIAGNOSIS — M6281 Muscle weakness (generalized): Secondary | ICD-10-CM | POA: Diagnosis not present

## 2012-09-16 DIAGNOSIS — M2550 Pain in unspecified joint: Secondary | ICD-10-CM | POA: Diagnosis not present

## 2012-09-16 DIAGNOSIS — I69928 Other speech and language deficits following unspecified cerebrovascular disease: Secondary | ICD-10-CM | POA: Diagnosis not present

## 2012-09-16 DIAGNOSIS — I69920 Aphasia following unspecified cerebrovascular disease: Secondary | ICD-10-CM | POA: Diagnosis not present

## 2012-09-16 DIAGNOSIS — M84429A Pathological fracture, unspecified humerus, initial encounter for fracture: Secondary | ICD-10-CM | POA: Diagnosis not present

## 2012-09-16 DIAGNOSIS — G20 Parkinson's disease: Secondary | ICD-10-CM | POA: Diagnosis not present

## 2012-09-19 DIAGNOSIS — G20 Parkinson's disease: Secondary | ICD-10-CM | POA: Diagnosis not present

## 2012-09-19 DIAGNOSIS — I69928 Other speech and language deficits following unspecified cerebrovascular disease: Secondary | ICD-10-CM | POA: Diagnosis not present

## 2012-09-19 DIAGNOSIS — M2550 Pain in unspecified joint: Secondary | ICD-10-CM | POA: Diagnosis not present

## 2012-09-19 DIAGNOSIS — M6281 Muscle weakness (generalized): Secondary | ICD-10-CM | POA: Diagnosis not present

## 2012-09-19 DIAGNOSIS — I69920 Aphasia following unspecified cerebrovascular disease: Secondary | ICD-10-CM | POA: Diagnosis not present

## 2012-09-19 DIAGNOSIS — M84429A Pathological fracture, unspecified humerus, initial encounter for fracture: Secondary | ICD-10-CM | POA: Diagnosis not present

## 2012-09-20 DIAGNOSIS — R1312 Dysphagia, oropharyngeal phase: Secondary | ICD-10-CM | POA: Diagnosis not present

## 2012-09-20 DIAGNOSIS — I69928 Other speech and language deficits following unspecified cerebrovascular disease: Secondary | ICD-10-CM | POA: Diagnosis not present

## 2012-09-20 DIAGNOSIS — E785 Hyperlipidemia, unspecified: Secondary | ICD-10-CM | POA: Diagnosis not present

## 2012-09-20 DIAGNOSIS — F411 Generalized anxiety disorder: Secondary | ICD-10-CM | POA: Diagnosis not present

## 2012-09-20 DIAGNOSIS — G20 Parkinson's disease: Secondary | ICD-10-CM | POA: Diagnosis not present

## 2012-09-20 DIAGNOSIS — Z79899 Other long term (current) drug therapy: Secondary | ICD-10-CM | POA: Diagnosis not present

## 2012-09-20 DIAGNOSIS — Z4789 Encounter for other orthopedic aftercare: Secondary | ICD-10-CM | POA: Diagnosis not present

## 2012-09-20 DIAGNOSIS — R279 Unspecified lack of coordination: Secondary | ICD-10-CM | POA: Diagnosis not present

## 2012-09-20 DIAGNOSIS — M6281 Muscle weakness (generalized): Secondary | ICD-10-CM | POA: Diagnosis not present

## 2012-09-20 DIAGNOSIS — F0280 Dementia in other diseases classified elsewhere without behavioral disturbance: Secondary | ICD-10-CM | POA: Diagnosis not present

## 2012-09-20 DIAGNOSIS — M2550 Pain in unspecified joint: Secondary | ICD-10-CM | POA: Diagnosis not present

## 2012-09-20 DIAGNOSIS — R488 Other symbolic dysfunctions: Secondary | ICD-10-CM | POA: Diagnosis not present

## 2012-09-20 DIAGNOSIS — I10 Essential (primary) hypertension: Secondary | ICD-10-CM | POA: Diagnosis not present

## 2012-09-20 DIAGNOSIS — I69920 Aphasia following unspecified cerebrovascular disease: Secondary | ICD-10-CM | POA: Diagnosis not present

## 2012-09-20 DIAGNOSIS — M84429A Pathological fracture, unspecified humerus, initial encounter for fracture: Secondary | ICD-10-CM | POA: Diagnosis not present

## 2012-09-20 DIAGNOSIS — R609 Edema, unspecified: Secondary | ICD-10-CM | POA: Diagnosis not present

## 2012-09-21 ENCOUNTER — Ambulatory Visit (INDEPENDENT_AMBULATORY_CARE_PROVIDER_SITE_OTHER): Payer: Medicare Other | Admitting: Neurology

## 2012-09-21 ENCOUNTER — Encounter: Payer: Self-pay | Admitting: Neurology

## 2012-09-21 VITALS — BP 155/72 | HR 64 | Ht 60.0 in | Wt 122.0 lb

## 2012-09-21 DIAGNOSIS — R413 Other amnesia: Secondary | ICD-10-CM | POA: Diagnosis not present

## 2012-09-21 DIAGNOSIS — S52502S Unspecified fracture of the lower end of left radius, sequela: Secondary | ICD-10-CM

## 2012-09-21 DIAGNOSIS — G20 Parkinson's disease: Secondary | ICD-10-CM

## 2012-09-21 MED ORDER — CARBIDOPA-LEVODOPA 25-100 MG PO TABS: ORAL_TABLET | ORAL | Status: DC

## 2012-09-21 NOTE — Patient Instructions (Addendum)
I think overall you are doing fairly well but I do want to suggest a few things today:  Remember to drink plenty of fluid, eat healthy meals and do not skip any meals. Try to eat protein with a every meal and eat a healthy snack such as fruit or nuts in between meals. Try to keep a regular sleep-wake schedule and try to exercise daily, particularly in the form of walking, 10-20 minutes a day, if you can.   Engage in social activities in your community and with your family and try to keep up with current events by reading the newspaper or watching the news.   As far as your medications are concerned, I would like to suggest an increase in your morning dose of sinemet to 1 1/2 pills, otherwise it stays the same.   As far as diagnostic testing: no new test.  I would like to see you back in 4 months, sooner if we need to. Please call us with any interim questions, concerns, problems, updates or refill requests.  Brett Canales is my clinical assistant and will answer any of your questions and relay your messages to me and also relay most of my messages to you.  Our phone number is [phone]. We also have an after hours call service for urgent matters and there is a physician on-call for urgent questions. For any emergencies you know to call 911 or go to the nearest emergency room.

## 2012-09-21 NOTE — Progress Notes (Signed)
Subjective:    Patient ID: Vanessa Mcbride is a 79 y.o. female.  HPI  Interim history:   Vanessa Mcbride is a very friendly 79 year old right-handed woman who presents for followup consultation of her Parkinson's disease. She is accompanied by one of her caretakers from [facility], [place], and another caretaker, that works for her Daughter Leanord Hawking), Morton Peters, today. I last saw her on 06/09/2012. She has an underlying medical history of hyperlipidemia, hypertension, osteoarthritis, insomnia, memory loss, anxiety and recent fall with fracture of her left arm. At the time of her last visit and I felt she looked improved. I did not make any medication changes at the time. She was asked to continue Sinemet CR 25/100 mg strength one tablet 3 times a day. She was asked to continue with occupational therapy for her left arm. She is on     Her Past Medical History Is Significant For: Past Medical History  Diagnosis Date  . Parkinson's disease   . Stroke   . Hypertension   . Arthritis     osteoarthritis  . Scoliosis   . Memory loss 06/09/2012  . Aortic valve disorders 04/11/2012  . Dementia in conditions classified elsewhere without behavioral disturbance(294.10) 04/07/2012  . Hyperlipidemia   . Osteoarthrosis, unspecified whether generalized or localized, unspecified site   . Insomnia, unspecified 03/20/2012  . Fracture closed, humerus 03/20/2012  . Other closed fractures of distal end of radius (alone) 03/20/2012  . Transient ischemic attack (TIA), and cerebral infarction without residual deficits(V12.54)   . Unspecified constipation   . Debility, unspecified 03/18/2012  . Anxiety   . Generalized anxiety disorder     Her Past Surgical History Is Significant For: Past Surgical History  Procedure Laterality Date  . Breast surgery      benign lumpectomy    Her Family History Is Significant For: Family History  Problem Relation Age of Onset  . Heart disease Son   . Diabetes Mother   . Diabetes Sister    . Hypertension Sister     Her Social History Is Significant For: History   Social History  . Marital Status: Married    Spouse Name: N/A    Number of Children: N/A  . Years of Education: N/A   Social History Main Topics  . Smoking status: Never Smoker   . Smokeless tobacco: Never Used  . Alcohol Use: No  . Drug Use: No  . Sexually Active: No   Other Topics Concern  . None   Social History Narrative  . None    Her Allergies Are:  Allergies  Allergen Reactions  . Iodine Anaphylaxis  . Iohexol      Desc: pt has had contrast in the past (years ago) and it causea anaphylaxis- per pt's daughter.  stephanie davis,rtrct, Onset Date: [id number]   :   Her Current Medications Are:  Outpatient Encounter Prescriptions as of 09/21/2012  Medication Sig Dispense Refill  . acetaminophen (TYLENOL) 325 MG tablet Take 2 tablets (650 mg total) by mouth 3 (three) times daily. May have additional 650mg  daily as needed  90 tablet  11  . amLODipine (NORVASC) 2.5 MG tablet Take 2.5 mg by mouth daily.      Marland Kitchen antiseptic oral rinse (BIOTENE) LIQD 15 mLs by Mouth Rinse route. Use 4 times daily before meals and bedtime      . aspirin 81 MG tablet Take 81 mg by mouth daily.      . carbidopa-levodopa (SINEMET IR) 25-100 MG per tablet Take  1 tablet by mouth 3 (three) times daily.       . diclofenac sodium (VOLTAREN) 1 % GEL Apply 2 g topically. Use 2 grams to each shoulder three times daily to reduce pain      . escitalopram (LEXAPRO) 5 MG tablet Take 10 mg by mouth every morning.       . Melatonin 10 MG TABS Take 1 tablet by mouth at bedtime as needed. sleep      . metoprolol succinate (TOPROL-XL) 25 MG 24 hr tablet Take 12.5 mg by mouth at bedtime.      . Multiple Vitamin (MULTIVITAMIN WITH MINERALS) TABS Take 1 tablet by mouth at bedtime.      . simvastatin (ZOCOR) 10 MG tablet Take 10 mg by mouth at bedtime.      Marland Kitchen tetrahydrozoline-zinc (VISINE-AC) 0.05-0.25 % ophthalmic solution 2 drops 4 (four)  times daily as needed.       No facility-administered encounter medications on file as of 09/21/2012.  : Review of Systems  HENT: Positive for hearing loss.   Eyes: Positive for pain.  Respiratory: Positive for cough.   Gastrointestinal: Positive for constipation.  Musculoskeletal: Positive for joint swelling.    Objective:  Neurologic Exam  Physical Exam  Physical Examination:   Filed Vitals:   09/21/12 1528  BP: 155/72  Pulse: 64    General Examination: The patient is a very pleasant 79 y.o. female in no acute distress. She appears frail and is well groomed.   HEENT exam: Normocephalic, atraumatic, mild facial masking is noted. She has no lip, neck or jaw tremor. Neck tone is mildly elevated. She has normal range of motion. Hearing is intact. Speech is otherwise clear. Oropharynx exam reveals no abnormalities. Pupils are reactive to light and extraocular tracking is fair with mild saccadic breakdown of smooth pursuit. Chest is clear to auscultation with no wheezing or rhonchi. Heart sounds are normal without murmurs, rubs or gallops. Abdomen is soft, nontender. Bowel sounds are appreciated. She has no pitting edema today. Neurologically: Mental status: The patient is awake, alert and oriented to self, and circumstance. Cranial nerves are as described under HEENT exam. Motor exam: She has a thin bulk. Strength is normal for age. Her left arm mobility is better. Tone is increased on the right side with mild cogwheeling noted in the  right upper extremity. She has a fairly normal tone on the left with mild increase in tone noted in the left lower extremity. She does not have much in the way of tremors today. She has significant slowness today. She has moderate to severe impairment of finger taps and hand movements on the right, this is slightly better on the left. She has moderate impairment of foot taps on the right and mild to moderate impairment of foot taps on the left. She is able to  stand up from the seated position and does take a couple of attempts but is able to do this on her own for the most part with very minimal elbow assistance provided by her caretaker. She continues to have a mild to moderately stooped posture. She walks with a mild shuffle but no assistance today. She does have a decreased stride length and almost absent arm swings bilaterally. She turns in 3 steps. Reflexes are 1+ in the upper extremity on the right and 2+ in the lower extremities. Sensory exam is intact to light touch throughout.   Assessment and plan:    In summary, Vanessa Mcbride is an  79 year old lady with a Parkinson's disease, right side and predominant with overall moderate findings and Sx worse first thing in the morning, but overall improvement from her first visit with me. I would recommend increasing her morning dose of sinemet to 1 1/2 pills, and otherwise, stay the same with her other doses, namely 1 at midday and 1 in the evening. I discussed my findings with her and her caretakers and suggested that they continue with outpatient therapy. I would like to see her back in 4 months, sooner if the need arises. They were in agreement.

## 2012-10-17 ENCOUNTER — Non-Acute Institutional Stay: Payer: Medicare Other | Admitting: Internal Medicine

## 2012-10-17 ENCOUNTER — Encounter: Payer: Self-pay | Admitting: Internal Medicine

## 2012-10-17 VITALS — BP 110/60 | HR 60 | Ht 60.0 in | Wt 123.0 lb

## 2012-10-17 DIAGNOSIS — Q809 Congenital ichthyosis, unspecified: Secondary | ICD-10-CM

## 2012-10-17 DIAGNOSIS — G20A1 Parkinson's disease without dyskinesia, without mention of fluctuations: Secondary | ICD-10-CM

## 2012-10-17 DIAGNOSIS — F0280 Dementia in other diseases classified elsewhere without behavioral disturbance: Secondary | ICD-10-CM

## 2012-10-17 DIAGNOSIS — G20 Parkinson's disease: Secondary | ICD-10-CM

## 2012-10-17 DIAGNOSIS — E785 Hyperlipidemia, unspecified: Secondary | ICD-10-CM

## 2012-10-17 DIAGNOSIS — I10 Essential (primary) hypertension: Secondary | ICD-10-CM

## 2012-10-17 DIAGNOSIS — R269 Unspecified abnormalities of gait and mobility: Secondary | ICD-10-CM

## 2012-10-17 DIAGNOSIS — M79609 Pain in unspecified limb: Secondary | ICD-10-CM | POA: Diagnosis not present

## 2012-10-17 DIAGNOSIS — Q828 Other specified congenital malformations of skin: Secondary | ICD-10-CM

## 2012-10-17 DIAGNOSIS — M79601 Pain in right arm: Secondary | ICD-10-CM

## 2012-10-17 MED ORDER — HYDROCORTISONE 1 % EX CREA: TOPICAL_CREAM | CUTANEOUS | Status: DC

## 2012-10-17 NOTE — Patient Instructions (Signed)
Continue current medication.

## 2012-10-17 NOTE — Progress Notes (Signed)
Subjective:    Patient ID: Vanessa Mcbride, female    DOB: Nov 12, 1924, 79 y.o.   MRN: [id number]  HPI  Parkinson disease: this is her biggest problem at this time. She is concerned about the shaking. She has gait instability, but does not want to use walker or cane. She holds the hand of her attendant. Saw Dr. Frances Furbish, neurologist, last month. Sinemet was increased a little to 1 and 1/2 tablets in the morning. She does not think this has helped.  HTN (hypertension): controlled  Dementia in conditions classified elsewhere without behavioral disturbance(294.10): unchanged.  Pain, arm, right: improved  Abnormality of gait: as a result og her Parkinsons  Had episode of red cheeks after she drinks hot tea. Also, some redness on the shoulders near the bra straps. No itching. Using Aveeno cream. Skin is dry and scaling.    Current Outpatient Prescriptions on File Prior to Visit  Medication Sig Dispense Refill  . acetaminophen (TYLENOL) 325 MG tablet Take 2 tablets (650 mg total) by mouth 3 (three) times daily. May have additional 650mg  daily as needed  90 tablet  11  . amLODipine (NORVASC) 2.5 MG tablet Take 2.5 mg by mouth daily.      Marland Kitchen antiseptic oral rinse (BIOTENE) LIQD 15 mLs by Mouth Rinse route. Use 4 times daily before meals and bedtime      . aspirin 81 MG tablet Take 81 mg by mouth daily.      . carbidopa-levodopa (SINEMET IR) 25-100 MG per tablet Take 1 1/2 pills in AM, 1 midday and 1 in the evening daily  120 tablet  3  . diclofenac sodium (VOLTAREN) 1 % GEL Apply 2 g topically. Use 2 grams to each shoulder three times daily to reduce pain      . escitalopram (LEXAPRO) 5 MG tablet Take 10 mg by mouth every morning.       . Melatonin 10 MG TABS Take 1 tablet by mouth at bedtime as needed. sleep      . metoprolol succinate (TOPROL-XL) 25 MG 24 hr tablet Take 12.5 mg by mouth at bedtime.      . Multiple Vitamin (MULTIVITAMIN WITH MINERALS) TABS Take 1 tablet by mouth at bedtime.      .  simvastatin (ZOCOR) 10 MG tablet Take 10 mg by mouth at bedtime.      Marland Kitchen tetrahydrozoline-zinc (VISINE-AC) 0.05-0.25 % ophthalmic solution 2 drops 4 (four) times daily as needed.       No current facility-administered medications on file prior to visit.     Review of Systems  Constitutional: Negative for fever, chills, diaphoresis, activity change, appetite change and unexpected weight change.  HENT: Negative.   Eyes: Negative.   Respiratory: Negative.   Cardiovascular: Negative.   Gastrointestinal: Negative.   Endocrine: Negative.   Genitourinary: Negative.   Musculoskeletal:       Right shoulder pain. Generalized arthralgias. Recent fracture of the left radius and of the right humerus.  Skin:       Areas of dryness and redness on the superior aspect pf both shoulders. Red cheeks after hot drinks.  Neurological: Positive for tremors. Negative for seizures, syncope, facial asymmetry, speech difficulty, weakness, numbness and headaches.       Loss of memory. So far this appears to be reasonably mild. Patient denies focal weakness.  Hematological: Negative.   Psychiatric/Behavioral: Negative.        Objective:BP 110/60  Pulse 60  Ht 5' (1.524 m)  Wt 123  lb (55.792 kg)  BMI 24.02 kg/m2    Physical Exam  Constitutional:  Frail elderly female in no acute distress.  HENT:  Head: Normocephalic and atraumatic.  Right Ear: External ear normal.  Left Ear: External ear normal.  Nose: Nose normal.  Eyes: Conjunctivae and EOM are normal. Pupils are equal, round, and reactive to light.  Neck: Normal range of motion. Neck supple. No JVD present. No tracheal deviation present. No thyromegaly present.  Cardiovascular: Normal rate, regular rhythm, normal heart sounds and intact distal pulses.  Exam reveals no gallop and no friction rub.   No murmur heard. Pulmonary/Chest: Effort normal and breath sounds normal. No respiratory distress. She has no wheezes. She has no rales. She exhibits no  tenderness.  Abdominal: Soft. Bowel sounds are normal. She exhibits no distension and no mass. There is no tenderness.  Musculoskeletal:  Tender in the mid upper half of the right humerus. Tender at the left wrist. No specific swelling or erythema at either of these areas. Gait appears to be slightly unstable. There is some difficulty with raising the right arm at the shoulder.  Lymphadenopathy:    She has no cervical adenopathy.  Neurological:  Mild memory loss. Cranial nerves intact no specific problems with coordination. She does have a tremor when arms are extended period. She has previously been diagnosed as having Parkinson's disease.  Skin: Skin is warm and dry. No pallor.  Tops of shoulders are slightly rd and scaling. Dry skin.  Psychiatric: She has a normal mood and affect. Her behavior is normal. Thought content normal.          Assessment & Plan:  Parkinson disease; continue to see Dr. Frances Furbish for medications adjustment  HTN (hypertension)   - Plan: Comprehensive metabolic panel  Dementia in conditions classified elsewhere without behavioral disturbance(294.10):   MMSE next visit  Pain, arm, right: improved  Abnormality of gait: discussed canr or walker, but she does not want to use  Xeroderma   - Plan: hydrocortisone cream (AVEENO ANTI-ITCH MAX ST) 1 %  Other and unspecified hyperlipidemia   - Plan: Lipid panel

## 2012-10-25 DIAGNOSIS — R488 Other symbolic dysfunctions: Secondary | ICD-10-CM | POA: Diagnosis not present

## 2012-10-25 DIAGNOSIS — I69928 Other speech and language deficits following unspecified cerebrovascular disease: Secondary | ICD-10-CM | POA: Diagnosis not present

## 2012-10-25 DIAGNOSIS — I69920 Aphasia following unspecified cerebrovascular disease: Secondary | ICD-10-CM | POA: Diagnosis not present

## 2012-10-25 DIAGNOSIS — G20 Parkinson's disease: Secondary | ICD-10-CM | POA: Diagnosis not present

## 2012-10-25 DIAGNOSIS — R1312 Dysphagia, oropharyngeal phase: Secondary | ICD-10-CM | POA: Diagnosis not present

## 2012-10-26 DIAGNOSIS — R1312 Dysphagia, oropharyngeal phase: Secondary | ICD-10-CM | POA: Diagnosis not present

## 2012-10-26 DIAGNOSIS — G20 Parkinson's disease: Secondary | ICD-10-CM | POA: Diagnosis not present

## 2012-10-26 DIAGNOSIS — I69928 Other speech and language deficits following unspecified cerebrovascular disease: Secondary | ICD-10-CM | POA: Diagnosis not present

## 2012-10-26 DIAGNOSIS — I69920 Aphasia following unspecified cerebrovascular disease: Secondary | ICD-10-CM | POA: Diagnosis not present

## 2012-10-26 DIAGNOSIS — R488 Other symbolic dysfunctions: Secondary | ICD-10-CM | POA: Diagnosis not present

## 2012-10-28 DIAGNOSIS — R488 Other symbolic dysfunctions: Secondary | ICD-10-CM | POA: Diagnosis not present

## 2012-10-28 DIAGNOSIS — I69920 Aphasia following unspecified cerebrovascular disease: Secondary | ICD-10-CM | POA: Diagnosis not present

## 2012-10-28 DIAGNOSIS — R1312 Dysphagia, oropharyngeal phase: Secondary | ICD-10-CM | POA: Diagnosis not present

## 2012-10-28 DIAGNOSIS — I69928 Other speech and language deficits following unspecified cerebrovascular disease: Secondary | ICD-10-CM | POA: Diagnosis not present

## 2012-10-28 DIAGNOSIS — G20 Parkinson's disease: Secondary | ICD-10-CM | POA: Diagnosis not present

## 2012-11-02 DIAGNOSIS — R488 Other symbolic dysfunctions: Secondary | ICD-10-CM | POA: Diagnosis not present

## 2012-11-02 DIAGNOSIS — R1312 Dysphagia, oropharyngeal phase: Secondary | ICD-10-CM | POA: Diagnosis not present

## 2012-11-02 DIAGNOSIS — G20 Parkinson's disease: Secondary | ICD-10-CM | POA: Diagnosis not present

## 2012-11-02 DIAGNOSIS — I69928 Other speech and language deficits following unspecified cerebrovascular disease: Secondary | ICD-10-CM | POA: Diagnosis not present

## 2012-11-02 DIAGNOSIS — I69920 Aphasia following unspecified cerebrovascular disease: Secondary | ICD-10-CM | POA: Diagnosis not present

## 2012-11-03 DIAGNOSIS — I69920 Aphasia following unspecified cerebrovascular disease: Secondary | ICD-10-CM | POA: Diagnosis not present

## 2012-11-03 DIAGNOSIS — R488 Other symbolic dysfunctions: Secondary | ICD-10-CM | POA: Diagnosis not present

## 2012-11-03 DIAGNOSIS — G20 Parkinson's disease: Secondary | ICD-10-CM | POA: Diagnosis not present

## 2012-11-03 DIAGNOSIS — R1312 Dysphagia, oropharyngeal phase: Secondary | ICD-10-CM | POA: Diagnosis not present

## 2012-11-03 DIAGNOSIS — I69928 Other speech and language deficits following unspecified cerebrovascular disease: Secondary | ICD-10-CM | POA: Diagnosis not present

## 2012-11-04 DIAGNOSIS — I69928 Other speech and language deficits following unspecified cerebrovascular disease: Secondary | ICD-10-CM | POA: Diagnosis not present

## 2012-11-04 DIAGNOSIS — R1312 Dysphagia, oropharyngeal phase: Secondary | ICD-10-CM | POA: Diagnosis not present

## 2012-11-04 DIAGNOSIS — G20 Parkinson's disease: Secondary | ICD-10-CM | POA: Diagnosis not present

## 2012-11-04 DIAGNOSIS — I69920 Aphasia following unspecified cerebrovascular disease: Secondary | ICD-10-CM | POA: Diagnosis not present

## 2012-11-04 DIAGNOSIS — R488 Other symbolic dysfunctions: Secondary | ICD-10-CM | POA: Diagnosis not present

## 2012-11-08 DIAGNOSIS — G20 Parkinson's disease: Secondary | ICD-10-CM | POA: Diagnosis not present

## 2012-11-08 DIAGNOSIS — I69920 Aphasia following unspecified cerebrovascular disease: Secondary | ICD-10-CM | POA: Diagnosis not present

## 2012-11-08 DIAGNOSIS — R1312 Dysphagia, oropharyngeal phase: Secondary | ICD-10-CM | POA: Diagnosis not present

## 2012-11-08 DIAGNOSIS — R488 Other symbolic dysfunctions: Secondary | ICD-10-CM | POA: Diagnosis not present

## 2012-11-08 DIAGNOSIS — I69928 Other speech and language deficits following unspecified cerebrovascular disease: Secondary | ICD-10-CM | POA: Diagnosis not present

## 2012-11-09 DIAGNOSIS — I69928 Other speech and language deficits following unspecified cerebrovascular disease: Secondary | ICD-10-CM | POA: Diagnosis not present

## 2012-11-09 DIAGNOSIS — I69920 Aphasia following unspecified cerebrovascular disease: Secondary | ICD-10-CM | POA: Diagnosis not present

## 2012-11-09 DIAGNOSIS — R1312 Dysphagia, oropharyngeal phase: Secondary | ICD-10-CM | POA: Diagnosis not present

## 2012-11-09 DIAGNOSIS — G20 Parkinson's disease: Secondary | ICD-10-CM | POA: Diagnosis not present

## 2012-11-09 DIAGNOSIS — R488 Other symbolic dysfunctions: Secondary | ICD-10-CM | POA: Diagnosis not present

## 2012-11-10 DIAGNOSIS — R488 Other symbolic dysfunctions: Secondary | ICD-10-CM | POA: Diagnosis not present

## 2012-11-10 DIAGNOSIS — I69920 Aphasia following unspecified cerebrovascular disease: Secondary | ICD-10-CM | POA: Diagnosis not present

## 2012-11-10 DIAGNOSIS — G20 Parkinson's disease: Secondary | ICD-10-CM | POA: Diagnosis not present

## 2012-11-10 DIAGNOSIS — I69928 Other speech and language deficits following unspecified cerebrovascular disease: Secondary | ICD-10-CM | POA: Diagnosis not present

## 2012-11-10 DIAGNOSIS — R1312 Dysphagia, oropharyngeal phase: Secondary | ICD-10-CM | POA: Diagnosis not present

## 2012-11-14 DIAGNOSIS — R488 Other symbolic dysfunctions: Secondary | ICD-10-CM | POA: Diagnosis not present

## 2012-11-14 DIAGNOSIS — I69920 Aphasia following unspecified cerebrovascular disease: Secondary | ICD-10-CM | POA: Diagnosis not present

## 2012-11-14 DIAGNOSIS — G20 Parkinson's disease: Secondary | ICD-10-CM | POA: Diagnosis not present

## 2012-11-14 DIAGNOSIS — I69928 Other speech and language deficits following unspecified cerebrovascular disease: Secondary | ICD-10-CM | POA: Diagnosis not present

## 2012-11-14 DIAGNOSIS — R1312 Dysphagia, oropharyngeal phase: Secondary | ICD-10-CM | POA: Diagnosis not present

## 2012-11-15 DIAGNOSIS — R1312 Dysphagia, oropharyngeal phase: Secondary | ICD-10-CM | POA: Diagnosis not present

## 2012-11-15 DIAGNOSIS — I69920 Aphasia following unspecified cerebrovascular disease: Secondary | ICD-10-CM | POA: Diagnosis not present

## 2012-11-15 DIAGNOSIS — I69928 Other speech and language deficits following unspecified cerebrovascular disease: Secondary | ICD-10-CM | POA: Diagnosis not present

## 2012-11-15 DIAGNOSIS — G20 Parkinson's disease: Secondary | ICD-10-CM | POA: Diagnosis not present

## 2012-11-15 DIAGNOSIS — R488 Other symbolic dysfunctions: Secondary | ICD-10-CM | POA: Diagnosis not present

## 2012-11-17 DIAGNOSIS — R488 Other symbolic dysfunctions: Secondary | ICD-10-CM | POA: Diagnosis not present

## 2012-11-17 DIAGNOSIS — G20 Parkinson's disease: Secondary | ICD-10-CM | POA: Diagnosis not present

## 2012-11-17 DIAGNOSIS — R1312 Dysphagia, oropharyngeal phase: Secondary | ICD-10-CM | POA: Diagnosis not present

## 2012-11-17 DIAGNOSIS — I69920 Aphasia following unspecified cerebrovascular disease: Secondary | ICD-10-CM | POA: Diagnosis not present

## 2012-11-17 DIAGNOSIS — I69928 Other speech and language deficits following unspecified cerebrovascular disease: Secondary | ICD-10-CM | POA: Diagnosis not present

## 2012-11-22 DIAGNOSIS — R488 Other symbolic dysfunctions: Secondary | ICD-10-CM | POA: Diagnosis not present

## 2012-11-22 DIAGNOSIS — G20 Parkinson's disease: Secondary | ICD-10-CM | POA: Diagnosis not present

## 2012-11-22 DIAGNOSIS — I69928 Other speech and language deficits following unspecified cerebrovascular disease: Secondary | ICD-10-CM | POA: Diagnosis not present

## 2012-11-22 DIAGNOSIS — R1312 Dysphagia, oropharyngeal phase: Secondary | ICD-10-CM | POA: Diagnosis not present

## 2012-11-22 DIAGNOSIS — I69920 Aphasia following unspecified cerebrovascular disease: Secondary | ICD-10-CM | POA: Diagnosis not present

## 2012-11-24 DIAGNOSIS — R488 Other symbolic dysfunctions: Secondary | ICD-10-CM | POA: Diagnosis not present

## 2012-11-24 DIAGNOSIS — G20 Parkinson's disease: Secondary | ICD-10-CM | POA: Diagnosis not present

## 2012-11-24 DIAGNOSIS — R1312 Dysphagia, oropharyngeal phase: Secondary | ICD-10-CM | POA: Diagnosis not present

## 2012-11-24 DIAGNOSIS — I69920 Aphasia following unspecified cerebrovascular disease: Secondary | ICD-10-CM | POA: Diagnosis not present

## 2012-11-24 DIAGNOSIS — I69928 Other speech and language deficits following unspecified cerebrovascular disease: Secondary | ICD-10-CM | POA: Diagnosis not present

## 2012-11-25 DIAGNOSIS — R1312 Dysphagia, oropharyngeal phase: Secondary | ICD-10-CM | POA: Diagnosis not present

## 2012-11-25 DIAGNOSIS — I69928 Other speech and language deficits following unspecified cerebrovascular disease: Secondary | ICD-10-CM | POA: Diagnosis not present

## 2012-11-25 DIAGNOSIS — I69920 Aphasia following unspecified cerebrovascular disease: Secondary | ICD-10-CM | POA: Diagnosis not present

## 2012-11-25 DIAGNOSIS — R488 Other symbolic dysfunctions: Secondary | ICD-10-CM | POA: Diagnosis not present

## 2012-11-25 DIAGNOSIS — G20 Parkinson's disease: Secondary | ICD-10-CM | POA: Diagnosis not present

## 2012-11-28 DIAGNOSIS — I69920 Aphasia following unspecified cerebrovascular disease: Secondary | ICD-10-CM | POA: Diagnosis not present

## 2012-11-28 DIAGNOSIS — R488 Other symbolic dysfunctions: Secondary | ICD-10-CM | POA: Diagnosis not present

## 2012-11-28 DIAGNOSIS — G20 Parkinson's disease: Secondary | ICD-10-CM | POA: Diagnosis not present

## 2012-11-28 DIAGNOSIS — R1312 Dysphagia, oropharyngeal phase: Secondary | ICD-10-CM | POA: Diagnosis not present

## 2012-11-28 DIAGNOSIS — I69928 Other speech and language deficits following unspecified cerebrovascular disease: Secondary | ICD-10-CM | POA: Diagnosis not present

## 2012-12-02 DIAGNOSIS — M79609 Pain in unspecified limb: Secondary | ICD-10-CM | POA: Diagnosis not present

## 2012-12-02 DIAGNOSIS — L84 Corns and callosities: Secondary | ICD-10-CM | POA: Diagnosis not present

## 2012-12-23 ENCOUNTER — Encounter: Payer: Self-pay | Admitting: Neurology

## 2012-12-23 ENCOUNTER — Ambulatory Visit (INDEPENDENT_AMBULATORY_CARE_PROVIDER_SITE_OTHER): Payer: Medicare Other | Admitting: Neurology

## 2012-12-23 VITALS — BP 150/69 | HR 57 | Temp 97.5°F | Ht 60.0 in | Wt 127.0 lb

## 2012-12-23 DIAGNOSIS — G20 Parkinson's disease: Secondary | ICD-10-CM

## 2012-12-23 DIAGNOSIS — S52502D Unspecified fracture of the lower end of left radius, subsequent encounter for closed fracture with routine healing: Secondary | ICD-10-CM

## 2012-12-23 DIAGNOSIS — R413 Other amnesia: Secondary | ICD-10-CM

## 2012-12-23 MED ORDER — CARBIDOPA-LEVODOPA 25-100 MG PO TABS: ORAL_TABLET | ORAL | Status: DC

## 2012-12-23 NOTE — Patient Instructions (Signed)
We are increasing you sinemet to 4 times a day. In about a month we can discuss gradually increasing each dose as well. Side effects to look out for: Nausea, vomiting, sedation, confusion, lightheadedness. Rare side effects include hallucinations, severe nausea or vomiting, diarrhea and significant drop in blood pressure especially when going from lying to standing or from sitting to standing.

## 2012-12-23 NOTE — Progress Notes (Signed)
Subjective:    Patient ID: Vanessa Mcbride is a 79 y.o. female.  HPI  Interim history:   Ms. Vanessa Mcbride is a very friendly 79 year old right-handed woman who presents for followup consultation of her Parkinson's disease. She is accompanied by one of her caretakers from [facility], [name], and another caretaker, Morton Peters, who works for her Daughter Vanessa Mcbride). I last saw her on 09/21/2012, at which time I increased her morning dose of Sinemet to 1-1/2 pills and otherwise she was to continue her midday dose at one pill and her afternoon dose at one pill. She feels her tremor has become worse. Prior to that I saw her on 06/09/2012. She has an underlying medical history of hyperlipidemia, hypertension, osteoarthritis, insomnia, memory loss, anxiety and history of fall with fracture of her left arm a few months ago. I did not make any medication changes in 3/14. She recently also saw her primary care physician who encouraged her to use her walker or cane but she does not like to use them. She holds onto her caretaker.  She has not fallen recently. She has occasional myoclonic jerks. Her memory is a little worse too. She does not sleep well.   Her Past Medical History Is Significant For: Past Medical History  Diagnosis Date  . Parkinson's disease   . Stroke   . Hypertension   . Arthritis     osteoarthritis  . Scoliosis   . Memory loss 06/09/2012  . Aortic valve disorders 04/11/2012  . Dementia in conditions classified elsewhere without behavioral disturbance(294.10) 04/07/2012  . Hyperlipidemia   . Osteoarthrosis, unspecified whether generalized or localized, unspecified site   . Insomnia, unspecified 03/20/2012  . Fracture closed, humerus 03/20/2012  . Other closed fractures of distal end of radius (alone) 03/20/2012  . Transient ischemic attack (TIA), and cerebral infarction without residual deficits(V12.54)   . Unspecified constipation   . Debility, unspecified 03/18/2012  . Anxiety   . Generalized anxiety  disorder     Her Past Surgical History Is Significant For: Past Surgical History  Procedure Laterality Date  . Breast surgery      benign lumpectomy    Her Family History Is Significant For: Family History  Problem Relation Age of Onset  . Heart disease Son   . Diabetes Mother   . Diabetes Sister   . Hypertension Sister     Her Social History Is Significant For: History   Social History  . Marital Status: Married    Spouse Name: N/A    Number of Children: N/A  . Years of Education: N/A   Social History Main Topics  . Smoking status: Never Smoker   . Smokeless tobacco: Never Used  . Alcohol Use: No  . Drug Use: No  . Sexual Activity: No   Other Topics Concern  . None   Social History Narrative  . None    Her Allergies Are:  Allergies  Allergen Reactions  . Iodine Anaphylaxis  . Iohexol      Desc: pt has had contrast in the past (years ago) and it causea anaphylaxis- per pt's daughter.  stephanie davis,rtrct, Onset Date: [id number]   :   Her Current Medications Are:  Outpatient Encounter Prescriptions as of 12/23/2012  Medication Sig Dispense Refill  . acetaminophen (TYLENOL) 325 MG tablet Take 2 tablets (650 mg total) by mouth 3 (three) times daily. May have additional 650mg  daily as needed  90 tablet  11  . amLODipine (NORVASC) 2.5 MG tablet Take 2.5 mg by  mouth daily.      Marland Kitchen antiseptic oral rinse (BIOTENE) LIQD 15 mLs by Mouth Rinse route. Use 4 times daily before meals and bedtime      . aspirin 81 MG tablet Take 81 mg by mouth daily.      . carbidopa-levodopa (SINEMET IR) 25-100 MG per tablet Take 1 1/2 pills in AM, 1 midday and 1 in the evening daily  120 tablet  3  . diclofenac sodium (VOLTAREN) 1 % GEL Apply 2 g topically. Use 2 grams to each shoulder three times daily to reduce pain      . escitalopram (LEXAPRO) 5 MG tablet Take 10 mg by mouth every morning.       . hydrocortisone cream (AVEENO ANTI-ITCH MAX ST) 1 % Apply twice daily to areas of  redness or itching  30 g  0  . Melatonin 10 MG TABS Take 1 tablet by mouth at bedtime as needed. sleep      . metoprolol succinate (TOPROL-XL) 25 MG 24 hr tablet Take 12.5 mg by mouth at bedtime.      . Multiple Vitamin (MULTIVITAMIN WITH MINERALS) TABS Take 1 tablet by mouth at bedtime.      . simvastatin (ZOCOR) 10 MG tablet Take 10 mg by mouth at bedtime.      Marland Kitchen tetrahydrozoline-zinc (VISINE-AC) 0.05-0.25 % ophthalmic solution 2 drops 4 (four) times daily as needed.       No facility-administered encounter medications on file as of 12/23/2012.  :  Review of Systems  Cardiovascular:       Snoring  Gastrointestinal: Positive for diarrhea.  Musculoskeletal: Positive for gait problem.       Movement / gait disorder worsening  Skin: Positive for rash.  Neurological: Positive for tremors.    Objective:  Neurologic Exam  Physical Exam Physical Examination:   Filed Vitals:   12/23/12 1126  BP: 150/69  Pulse: 57  Temp: 97.5 F (36.4 C)    General Examination: The patient is a very pleasant 79 y.o. female in no acute distress. She appears frail and is well groomed.   HEENT exam: Normocephalic, atraumatic, moderate facial masking is noted. She has no lip, neck or jaw tremor. Neck tone is moderately elevated. She has normal range of motion. Hearing is intact. Speech is otherwise clear. Oropharynx exam reveals no abnormalities. Pupils are reactive to light and extraocular tracking is fair with moderate saccadic breakdown of smooth pursuit. Chest is clear to auscultation with no wheezing or rhonchi. Heart sounds are normal without murmurs, rubs or gallops. Abdomen is soft, nontender. Bowel sounds are appreciated. She has no pitting edema today. Neurologically: Mental status: The patient is awake, alert and oriented to self, and circumstance. Cranial nerves are as described under HEENT exam. Motor exam: She has a thin bulk. Strength is normal for age. Her left arm mobility is better. Tone  is increased on the right side with moderate cogwheeling noted in the  right upper extremity and a little milder on the L. She has an intermittent resting tremor in the R>L UEs. She has significant slowness. She has severe impairment of finger taps on the R and moderate to severe impairment on the L. Same with hand movements. She has moderate impairment of foot taps bilaterally. She is able to stand up from the seated position with assistance. She walks with assistance. She has a moderately stooped posture. She walks with a mild shuffle and needs assistance today. She does have a decreased stride length  and almost absent arm swings bilaterally. She turns in 3 steps. Reflexes are 1+ in the upper extremity on the right and 2+ in the lower extremities. Sensory exam is intact to light touch throughout.   Assessment and plan:    In summary, Ms. Winchel is an 79 year old lady with a Parkinson's disease, right sided predominant with overall moderate to severe findings and Sx and exam and indeed worse since her last visit. She is advised to increase Sinemet to qid, name at 7 AM, 11 AM, 4 PM, 7 PM. Her first dose will remain 1-1/2 pills in the rest of the doses will be 1 pill each time. In the near future, we can discuss increasing all her doses to 1 1/2 pills. To that end, I have asked her caretaker [name] to give [place] a call in a month or 2, At which point we can decide to increase the dose. I discussed my findings with her and her caretakers and  also filled out her paperwork from her assisted living facility as well as provided him with written instructions. I would like to see her back in 3 months, sooner if the need arises. They were in agreement.

## 2013-01-04 ENCOUNTER — Encounter: Payer: Self-pay | Admitting: Geriatric Medicine

## 2013-01-04 ENCOUNTER — Non-Acute Institutional Stay: Payer: Medicare Other | Admitting: Geriatric Medicine

## 2013-01-04 VITALS — BP 126/58 | HR 60 | Ht 60.0 in | Wt 128.0 lb

## 2013-01-04 DIAGNOSIS — G20 Parkinson's disease: Secondary | ICD-10-CM | POA: Diagnosis not present

## 2013-01-04 DIAGNOSIS — I10 Essential (primary) hypertension: Secondary | ICD-10-CM

## 2013-01-04 DIAGNOSIS — E785 Hyperlipidemia, unspecified: Secondary | ICD-10-CM

## 2013-01-04 DIAGNOSIS — F411 Generalized anxiety disorder: Secondary | ICD-10-CM

## 2013-01-04 DIAGNOSIS — F0280 Dementia in other diseases classified elsewhere without behavioral disturbance: Secondary | ICD-10-CM | POA: Diagnosis not present

## 2013-01-04 NOTE — Assessment & Plan Note (Signed)
Patient's recent lipid panel all within normal limits. Patient does have history of TIA. Will continue low-dose statin for now, recheck lipid levels January 2015

## 2013-01-04 NOTE — Assessment & Plan Note (Signed)
Anxiety is being managed well with current medication and interventions by caregivers

## 2013-01-04 NOTE — Assessment & Plan Note (Signed)
Blood pressure well controlled with current medication, recent left were satisfactory. Update labs January 2015

## 2013-01-04 NOTE — Assessment & Plan Note (Signed)
Mobility issues related to Parkinson's disease, managing very well with assistance of 24-hour caregivers. Sinemet dose was increased recently by neurology, patient reports improved sleeping. The swallowing issue that manifested as coughing with meals improved with SLP interventions. Continue current medications and interventions to maintain mobility and activity level.

## 2013-01-04 NOTE — Progress Notes (Signed)
Patient ID: Vanessa Mcbride, female   DOB: 01-28-1925, 79 y.o.   MRN: [id number] [facility] [phone])  Code Status: DNR Contact Information   Name Relation Home Work [place] Daughter [phone]  [phone]      Chief Complaint  Patient presents with  . Medical Managment of Chronic Issues    Comprehensive exam : blood pressure, Parkinsons, dementia. 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    HPI: This is a 79 y.o. female resident of [facility]. This patient has not had a hospitalization, serious acute illness or injury in the last year. Patient visited [facility] December 2013 , fell/ fractured left arm. Stayed in [place], moved to [facility]. Patient's left humerus fracture healed, but she has significant mobility issues related to Parkinson's disease. She is followed by [facility] neurology, Dr. Vikki Ports, last visit was October 3. Sinemet dose was increased to help with bedtime rest patient and caregiver at to report that she is sleeping better. Patient has significant bilateral tremors as well as a gait disturbance 2 to Parkinson' ,. Was also noted with some swallowing issues several months ago; coughing with. She underwent SLP evaluation and treatment with improvement in symptoms. Caregiver continues to do daily exercises with patient. Patient has adjusted well to assisted living setting with her 24-hour caregivers. She is very busy daily with walking, exercises and attending many activities. Caregiver also works with tai chi each morning. Patient's functional status is quite limited due to her Parkinson's, she requires total assistance with dressing and bathing. Minimal assistance with other ADLs. She requires supervision with eating. Cognition is difficult to measure due to a language barrier though she does not appear to have any difficulty understanding directions answers questions appropriately,  and no behavior issues reported. Caregiver does report that the patient continues to be easily upset, anxious, and does require 24-hour assistance and caregiving. Review of record shows patient's vital signs and weight have been stable. Laboratory studies done July 2014 all satisfactory.    Allergies  Allergen Reactions  . Iodine Anaphylaxis  . Iohexol      Desc: pt has had contrast in the past (years ago) and it causea anaphylaxis- per pt's daughter.  Vanessa Mcbride,rtrct, Onset Date: [id number]    Medications    Medication List       This list is accurate as of: 01/04/13 11:08 AM.  Always use your most recent med list.               acetaminophen 325 MG tablet  Commonly known as:  TYLENOL  Take 2 tablets (650 mg total) by mouth 3 (three) times daily. May have additional 650mg  daily as needed     amLODipine 2.5 MG tablet  Commonly known as:  NORVASC  Take 2.5 mg by mouth daily.     antiseptic oral rinse Liqd  15 mLs by Mouth Rinse route. Use 4 times daily before meals and bedtime     aspirin 81 MG tablet  Take 81 mg by mouth daily.     carbidopa-levodopa 25-100 MG per tablet  Commonly known as:  SINEMET IR  Take 1 1/2 pills at 7 AM, 1 at 11, 1 at 4 PM and 1 pill at 7 PM daily     diclofenac sodium 1 % Gel  Commonly known as:  VOLTAREN  Apply 2 g topically. Use 2 grams to each shoulder three times daily to reduce pain  escitalopram 5 MG tablet  Commonly known as:  LEXAPRO  Take 10 mg by mouth every morning.     hydrocortisone cream 1 %  Commonly known as:  AVEENO ANTI-ITCH MAX ST  Apply twice daily to areas of redness or itching     Melatonin 10 MG Tabs  Take 1 tablet by mouth at bedtime as needed. sleep     metoprolol succinate 25 MG 24 hr tablet  Commonly known as:  TOPROL-XL  Take 12.5 mg by mouth at bedtime.     multivitamin with minerals Tabs tablet  Take 1 tablet by mouth at bedtime.     simvastatin 10 MG tablet  Commonly known as:  ZOCOR  Take  10 mg by mouth at bedtime.     tetrahydrozoline-zinc 0.05-0.25 % ophthalmic solution  Commonly known as:  VISINE-AC  2 drops 4 (four) times daily as needed.        DATA REVIEWED  Radiologic Exams:   Cardiovascular Exams:   Laboratory Studies  Hospital lab 03/15/2012 TPC 6.2, hemoglobin 12.8, hematocrit 37.4, platelets 189  Glucose 125, BUN 10. 10, creatinine 0.42, sodium 132, potassium 3.8   [facility] 09/20/2012 WBC 3.8, hemoglobin 12.9, hematocrit 38.0, platelet 223  Glucose 88, BUN 13, creatinine 0.58, sodium 141, potassium 4.2 protein/LFTs WNL.  Total cholesterol 147, triglycerides 65, HDL 69, LDL 65  TSH 1.1     Past Medical History  Diagnosis Date  . Parkinson's disease   . Stroke   . Hypertension   . Arthritis     osteoarthritis  . Scoliosis   . Memory loss 06/09/2012  . Aortic valve disorders 04/11/2012  . Dementia in conditions classified elsewhere without behavioral disturbance(294.10) 04/07/2012  . Hyperlipidemia   . Osteoarthrosis, unspecified whether generalized or localized, unspecified site   . Insomnia, unspecified 03/20/2012  . Fracture closed, humerus 03/20/2012  . Other closed fractures of distal end of radius (alone) 03/20/2012  . Transient ischemic attack (TIA), and cerebral infarction without residual deficits(V12.54)   . Unspecified constipation   . Debility, unspecified 03/18/2012  . Anxiety   . Generalized anxiety disorder    Past Surgical History  Procedure Laterality Date  . Breast surgery      benign lumpectomy  . Cholecystectomy     Family Status  Relation Status Death Age  . Son Alive     CAD  . Daughter Alive   . Mother Deceased   . Father Deceased   . Sister Alive    History   Social History Narrative   Widowed 02/2012 (after 49 yr marriage). [place] immigrant, understands English fairly well, speaks English poorly. Occupation: Retired Psychologist, sport and exercise. Moved form [place] to [place] 03/2012,     Lives at [facility],  [place] section. Has 24 hour caregivers.   Advanced Directives:  DNR, MOST (05/2012)        Review of Systems  DATA OBTAINED: from patient, medical record, caregiver Vanessa Mcbride, Bo) GENERAL: Feels well   No fevers, fatigue, change in appetite or weight SKIN: No itch, rash or open wounds EYES: No eye pain, dryness or itching  Reports decreased vision, can no longer read, asks for ophthalmology evaluation. EARS: No earache, tinnitus, poor hearing, chronic NOSE: No congestion, drainage or bleeding MOUTH/THROAT: No mouth or tooth pain  No sore throat   No difficulty chewing or swallowing  Occ cough w/ meals RESPIRATORY: No cough, wheezing, SOB CARDIAC: No chest pain, palpitations  No edema. CHEST/BREASTS: No discomfort, discharge or lumps  in breasts GI: No abdominal pain  No N/V. Chronic loose stools, occasional  constipation  No heartburn or reflux  GU: No dysuria, frequency or urgency  No change in urine volume or character   MUSCULOSKELETAL: No joint pain, swelling. Stiffness bilateral shoulders, rt. > left., some discomfort rt. Hip when walking/sitting  No back pain  No muscle ache, pain, weakness  Gait is unsteady  No recent falls.  NEUROLOGIC: No dizziness, fainting, headache,  No change in mental status.  PSYCHIATRIC: Intermittent anxiety, no depression Sleeps well.  No behavior issue.    Physical Exam Filed Vitals:   01/04/13 1002  BP: 126/58  Pulse: 60  Height: 5' (1.524 m)  Weight: 128 lb (58.06 kg)   Body mass index is 25 kg/(m^2).  GENERAL APPEARANCE: No acute distress, appropriately groomed, normal body habitus. Alert, pleasant, minimal conversation due to  Language barrier. Answers questions appropriately SKIN: No diaphoresis, rash, unusual lesions, wounds HEAD: Normocephalic, atraumatic EYES: Conjunctiva/lids clear. Pupils round, reactive. EOMs intact.  EARS: External exam WNL, Left canal with cerumen,TM WNL. Rt. Canal with cerumen impaction.  Hearing  decreased. NOSE: No deformity or discharge. MOUTH/THROAT: Lips w/o lesions. Oral mucosa, tongue moist, w/o lesion. Oropharynx w/o redness or lesions.  NECK: Supple, full ROM. No thyroid tenderness, enlargement or nodule LYMPHATICS: No head, neck or supraclavicular adenopathy RESPIRATORY: Breathing is even, unlabored. Lung sounds are clear and full.  CARDIOVASCULAR: Heart RRR. No murmur or extra heart sounds  ARTERIAL: No carotid, aortic or femoral bruit. Carotid, Femoral,DP pulse 2+.  VENOUS: No varicosities. No venous stasis skin changes  EDEMA: No peripheral or periorbital edema. GASTROINTESTINAL: Abdomen is soft, non-tender, not distended w/ normal bowel sounds. No hepatic or splenic enlargement. No mass, ventral or inguinal hernia.Wellhealed rt. Subcostal incision GENITOURINARY: Bladder non tender, not distended. MUSCULOSKELETAL: Moves all extremities slowly with full ROM of LE, decreased extension both shoulders. Adequate strength and tone all extremities. Back with kyphosis, No scoliosis or spinal process tenderness. Gait is unsteady NEUROLOGIC: Oriented to time, place, person. Cranial nerves 2-12 grossly intact, speech halting, clear, Bilateral hand tremor RT>Left. Patella, brachial DTR 2+. PSYCHIATRIC: Mood and affect appropriate to situation  ASSESSMENT/PLAN  HTN (hypertension) Blood pressure well controlled with current medication, recent left were satisfactory. Update labs January 2015  Parkinson disease Mobility issues related to Parkinson's disease, managing very well with assistance of 24-hour caregivers. Sinemet dose was increased recently by neurology, patient reports improved sleeping. The swallowing issue that manifested as coughing with meals improved with SLP interventions. Continue current medications and interventions to maintain mobility and activity level.  Dementia in conditions classified elsewhere without behavioral disturbance(294.10) Difficult to measure; patient  is assisted with all ADLs and interactions by a private caregivers. Nurse manages medications, meals are prepared and served in assisted living setting.   Hyperlipidemia Patient's recent lipid panel all within normal limits. Patient does have history of TIA. Will continue low-dose statin for now, recheck lipid levels January 2015  Generalized anxiety disorder Anxiety is being managed well with current medication and interventions by caregivers    Follow up: 3months Dr.Green, 6 months CTKrell, NP-C  Lab 03/2013 BMP, lipid panel  Malike Foglio T.Shalan Neault, NP-C 01/04/2013

## 2013-01-04 NOTE — Assessment & Plan Note (Signed)
Difficult to measure; patient is assisted with all ADLs and interactions by a private caregivers. Nurse manages medications, meals are prepared and served in assisted living setting.

## 2013-01-23 ENCOUNTER — Ambulatory Visit: Payer: Medicare Other | Admitting: Neurology

## 2013-02-08 DIAGNOSIS — H2640 Unspecified secondary cataract: Secondary | ICD-10-CM | POA: Diagnosis not present

## 2013-02-08 DIAGNOSIS — Z961 Presence of intraocular lens: Secondary | ICD-10-CM | POA: Diagnosis not present

## 2013-02-26 ENCOUNTER — Emergency Department (HOSPITAL_COMMUNITY): Payer: Medicare Other

## 2013-02-26 ENCOUNTER — Encounter (HOSPITAL_COMMUNITY): Payer: Self-pay | Admitting: Emergency Medicine

## 2013-02-26 ENCOUNTER — Emergency Department (HOSPITAL_COMMUNITY)
Admission: EM | Admit: 2013-02-26 | Discharge: 2013-02-26 | Disposition: A | Discharge status: Home / Self Care | Payer: Medicare Other | Attending: Emergency Medicine | Admitting: Emergency Medicine

## 2013-02-26 DIAGNOSIS — S20229A Contusion of unspecified back wall of thorax, initial encounter: Secondary | ICD-10-CM | POA: Diagnosis not present

## 2013-02-26 DIAGNOSIS — I10 Essential (primary) hypertension: Secondary | ICD-10-CM | POA: Diagnosis not present

## 2013-02-26 DIAGNOSIS — Z79899 Other long term (current) drug therapy: Secondary | ICD-10-CM | POA: Insufficient documentation

## 2013-02-26 DIAGNOSIS — Z8781 Personal history of (healed) traumatic fracture: Secondary | ICD-10-CM | POA: Insufficient documentation

## 2013-02-26 DIAGNOSIS — Z7982 Long term (current) use of aspirin: Secondary | ICD-10-CM | POA: Insufficient documentation

## 2013-02-26 DIAGNOSIS — Z8673 Personal history of transient ischemic attack (TIA), and cerebral infarction without residual deficits: Secondary | ICD-10-CM | POA: Insufficient documentation

## 2013-02-26 DIAGNOSIS — E785 Hyperlipidemia, unspecified: Secondary | ICD-10-CM | POA: Insufficient documentation

## 2013-02-26 DIAGNOSIS — Y9210 Unspecified residential institution as the place of occurrence of the external cause: Secondary | ICD-10-CM | POA: Insufficient documentation

## 2013-02-26 DIAGNOSIS — F411 Generalized anxiety disorder: Secondary | ICD-10-CM | POA: Insufficient documentation

## 2013-02-26 DIAGNOSIS — R296 Repeated falls: Secondary | ICD-10-CM | POA: Insufficient documentation

## 2013-02-26 DIAGNOSIS — R5381 Other malaise: Secondary | ICD-10-CM | POA: Diagnosis not present

## 2013-02-26 DIAGNOSIS — Y9389 Activity, other specified: Secondary | ICD-10-CM | POA: Insufficient documentation

## 2013-02-26 DIAGNOSIS — S20221A Contusion of right back wall of thorax, initial encounter: Secondary | ICD-10-CM

## 2013-02-26 DIAGNOSIS — M1990 Unspecified osteoarthritis, unspecified site: Secondary | ICD-10-CM | POA: Diagnosis not present

## 2013-02-26 DIAGNOSIS — F0390 Unspecified dementia without behavioral disturbance: Secondary | ICD-10-CM | POA: Insufficient documentation

## 2013-02-26 DIAGNOSIS — S7001XA Contusion of right hip, initial encounter: Secondary | ICD-10-CM

## 2013-02-26 DIAGNOSIS — S7000XA Contusion of unspecified hip, initial encounter: Secondary | ICD-10-CM | POA: Insufficient documentation

## 2013-02-26 NOTE — ED Notes (Addendum)
Patient is a resident of [facility] and has 24 hour NT service. Patient fell in the bathroom yesterday and today the patient is still c/o right hip pain. Patient states she turned around to find the towel and then fell. Patient states she hit her head and daughter states that she has an abrasion to the back.

## 2013-02-26 NOTE — ED Provider Notes (Signed)
CSN: [id number]     Arrival date & time 02/26/13  0830 History   First MD Initiated Contact with Patient 02/26/13 [phone]     Chief Complaint  Patient presents with  . Fall  . Hip Pain   (Consider location/radiation/quality/duration/timing/severity/associated sxs/prior Treatment) HPI Comments: Patient presents after sustaining a fall. She is a resident of assisted-living at [facility] so he. She has 24-hour nursing care however she was in the bathroom last night alone and says that she was turning around to get a towel and fell. Her daughter states that it's not unusual for her to fall to be unsteady. She's been complaining of some pain in her right hip since that time. She has been a little put weight on the hip. She got an Ultram last night for pain and since the fall the daughter says that she's been a little more drowsy than normal. She does say that she hit her head on the floor but has not been complaining of any headache or vomiting. He did not note any signs of trauma to the head. She's not been complaining of any other pain from the fall. She's had no recent illnesses. She's had no known fevers or vomiting. She's had no recent urinary difficulties. She's had no chest pain or shortness of breath.  Patient is a 79 y.o. female presenting with fall and hip pain.  Fall Pertinent negatives include no chest pain, no abdominal pain, no headaches and no shortness of breath.  Hip Pain Pertinent negatives include no chest pain, no abdominal pain, no headaches and no shortness of breath.    Past Medical History  Diagnosis Date  . Parkinson's disease   . Stroke   . Hypertension   . Arthritis     osteoarthritis  . Scoliosis   . Memory loss 06/09/2012  . Aortic valve disorders 04/11/2012  . Dementia in conditions classified elsewhere without behavioral disturbance(294.10) 04/07/2012  . Hyperlipidemia   . Osteoarthrosis, unspecified whether generalized or localized, unspecified site    . Insomnia, unspecified 03/20/2012  . Fracture closed, humerus 03/20/2012  . Other closed fractures of distal end of radius (alone) 03/20/2012  . Transient ischemic attack (TIA), and cerebral infarction without residual deficits(V12.54)   . Unspecified constipation   . Debility, unspecified 03/18/2012  . Anxiety   . Generalized anxiety disorder    Past Surgical History  Procedure Laterality Date  . Breast surgery      benign lumpectomy  . Cholecystectomy     Family History  Problem Relation Age of Onset  . Heart disease Son     CAD  . Diabetes Mother   . Diabetes Sister   . Hypertension Sister    History  Substance Use Topics  . Smoking status: Never Smoker   . Smokeless tobacco: Never Used  . Alcohol Use: No   OB History   Grav Para Term Preterm Abortions TAB SAB Ect Mult Living                 Review of Systems  Constitutional: Negative for fever, chills and fatigue.  HENT: Negative for congestion and rhinorrhea.   Eyes: Negative.   Respiratory: Negative for cough, chest tightness and shortness of breath.   Cardiovascular: Negative for chest pain.  Gastrointestinal: Negative for nausea, vomiting, abdominal pain and diarrhea.  Genitourinary: Negative for frequency, decreased urine volume and difficulty urinating.  Musculoskeletal: Positive for arthralgias and back pain.  Skin: Negative for rash and wound.  Neurological: Positive  for weakness (generalized, unchanged from baseline). Negative for dizziness, speech difficulty, numbness and headaches.    Allergies  Iodine and Iohexol  Home Medications   Current Outpatient Rx  Name  Route  Sig  Dispense  Refill  . acetaminophen (TYLENOL) 325 MG tablet   Oral   Take 650 mg by mouth every 6 (six) hours as needed.         Marland Kitchen amLODipine (NORVASC) 2.5 MG tablet   Oral   Take 2.5 mg by mouth daily.         Marland Kitchen antiseptic oral rinse (BIOTENE) LIQD   Mouth Rinse   15 mLs by Mouth Rinse route. Use 4 times daily  before meals and bedtime         . aspirin 81 MG tablet   Oral   Take 81 mg by mouth daily.         . carbidopa-levodopa (SINEMET IR) 25-100 MG per tablet   Oral   Take 1-1.5 tablets by mouth 4 (four) times daily. Pt takes 1.5 tablets in the am and 1 tablet at 11a, 4p, 7p         . diclofenac sodium (VOLTAREN) 1 % GEL   Topical   Apply 2 g topically. Use 2 grams to each shoulder three times daily to reduce pain         . escitalopram (LEXAPRO) 10 MG tablet   Oral   Take 10 mg by mouth daily.         Marland Kitchen loperamide (IMODIUM) 2 MG capsule   Oral   Take 2 mg by mouth every 6 (six) hours as needed for diarrhea or loose stools.         . Melatonin 10 MG TABS   Oral   Take 1 tablet by mouth at bedtime as needed. sleep         . metoprolol succinate (TOPROL-XL) 25 MG 24 hr tablet   Oral   Take 12.5 mg by mouth at bedtime.         . Multiple Vitamin (MULTIVITAMIN WITH MINERALS) TABS   Oral   Take 1 tablet by mouth at bedtime.         . simvastatin (ZOCOR) 10 MG tablet   Oral   Take 10 mg by mouth at bedtime.         . traMADol (ULTRAM) 50 MG tablet   Oral   Take 50 mg by mouth every 6 (six) hours as needed.          BP 118/52  Pulse 56  Temp(Src) 97.7 F (36.5 C) (Oral)  Resp 16  SpO2 96% Physical Exam  Constitutional: She appears well-developed and well-nourished.  HENT:  Head: Normocephalic and atraumatic.  Mouth/Throat: Oropharynx is clear and moist.  Eyes: Pupils are equal, round, and reactive to light.  Neck: Normal range of motion. Neck supple.  Cardiovascular: Normal rate, regular rhythm and normal heart sounds.   Pulmonary/Chest: Effort normal and breath sounds normal. No respiratory distress. She has no wheezes. She has no rales. She exhibits no tenderness.  Abdominal: Soft. Bowel sounds are normal. There is no tenderness. There is no rebound and no guarding.  Musculoskeletal: Normal range of motion. She exhibits no edema.  Patient has  some tenderness to palpation along L4-L5. No step-offs deformities are noted. There's no pain to the thoracic spine there some questionable tenderness to the mid cervical spine. No step-offs or deformities are noted. There some mild discomfort on range  of motion of the right hip. There is no pain on palpation or range of motion to the right knee. There is no deformity or shortening noted. There's no other pain on palpation or range of motion extremities. Pedal pulses are intact in the right leg.  Lymphadenopathy:    She has no cervical adenopathy.  Neurological: She is alert.  Patient is alert and answers questions appropriately. She is oriented to person place and circumstances. She occasionally has some confusion which is normal her daughter. She slightly more drowsy than her baseline mental status per the daughter. Motor 4/5 all extremities. Sensation grossly intact to light touch all extremities. No facial drooping or significant aphasia.  Speech at baseline per family.  Skin: Skin is warm and dry. No rash noted.  Psychiatric: She has a normal mood and affect.    ED Course  Procedures (including critical care time) Labs Review Labs Reviewed - No data to display Imaging Review Dg Lumbar Spine Complete  02/26/2013   CLINICAL DATA:  Fall, low back pain, right hip pain  EXAM: LUMBAR SPINE - COMPLETE 4+ VIEW  COMPARISON:  None.  FINDINGS: Five views of lumbar spine submitted. Diffuse osteopenia. Mild levoscoliosis. Multilevel mild anterior spurring. There is disc space flattening at L1-L2 and L2-L3 level. Disc space flattening with mild anterior spurring at T11-T12 level.  IMPRESSION: No acute fracture or subluxation. Mild degenerative changes. Diffuse osteopenia. Mild levoscoliosis.   Electronically Signed   By: Natasha Mead M.D.   On: 02/26/2013 09:40   Dg Hip Complete Right  02/26/2013   CLINICAL DATA:  Fall.  Hip pain  EXAM: RIGHT HIP - COMPLETE 2+ VIEW  COMPARISON:  None.  FINDINGS: There is no  evidence of hip fracture or dislocation. Mild osteoarthritis involves the right hip. There is no evidence of arthropathy or other focal bone abnormality.  IMPRESSION: Mild osteoarthritis   Electronically Signed   By: Signa Kell M.D.   On: 02/26/2013 09:38   Ct Head Wo Contrast  02/26/2013   CLINICAL DATA:  Fall, head injury  EXAM: CT HEAD WITHOUT CONTRAST  CT CERVICAL SPINE WITHOUT CONTRAST  TECHNIQUE: Multidetector CT imaging of the head and cervical spine was performed following the standard protocol without intravenous contrast. Multiplanar CT image reconstructions of the cervical spine were also generated.  COMPARISON:  03/14/2012  FINDINGS: CT HEAD FINDINGS  No skull fracture is noted. Paranasal sinuses and mastoid air cells are unremarkable. No intracranial hemorrhage, mass effect or midline shift. Atherosclerotic calcifications of carotid siphon again noted. No acute cortical infarction. No mass lesion is noted on this unenhanced scan. Stable atrophy and chronic white matter disease. No intraventricular hemorrhage.  CT CERVICAL SPINE FINDINGS  Axial images of the cervical spine shows no acute fracture or subluxation. Computer processed images demonstrate no acute fracture or subluxation. Mild degenerative changes C1-C2 articulation. Mild disc space flattening at C4-C5 and C5-C6 level. No prevertebral soft tissue swelling. Mild anterior spurring lower endplate of C4 and C5 vertebral body. No pneumothorax in visualized lung apices.  Bilateral apical scarring is noted.  Cervical airway is patent.  IMPRESSION: 1. No acute intracranial abnormality. Stable atrophy and chronic white matter disease. 2. No cervical spine acute fracture or subluxation. Diffuse osteopenia. Degenerative changes as described above. Bilateral lung apices scarring.   Electronically Signed   By: Natasha Mead M.D.   On: 02/26/2013 09:26   Ct Cervical Spine Wo Contrast  02/26/2013   CLINICAL DATA:  Fall, head injury  EXAM:  CT HEAD  WITHOUT CONTRAST  CT CERVICAL SPINE WITHOUT CONTRAST  TECHNIQUE: Multidetector CT imaging of the head and cervical spine was performed following the standard protocol without intravenous contrast. Multiplanar CT image reconstructions of the cervical spine were also generated.  COMPARISON:  03/14/2012  FINDINGS: CT HEAD FINDINGS  No skull fracture is noted. Paranasal sinuses and mastoid air cells are unremarkable. No intracranial hemorrhage, mass effect or midline shift. Atherosclerotic calcifications of carotid siphon again noted. No acute cortical infarction. No mass lesion is noted on this unenhanced scan. Stable atrophy and chronic white matter disease. No intraventricular hemorrhage.  CT CERVICAL SPINE FINDINGS  Axial images of the cervical spine shows no acute fracture or subluxation. Computer processed images demonstrate no acute fracture or subluxation. Mild degenerative changes C1-C2 articulation. Mild disc space flattening at C4-C5 and C5-C6 level. No prevertebral soft tissue swelling. Mild anterior spurring lower endplate of C4 and C5 vertebral body. No pneumothorax in visualized lung apices.  Bilateral apical scarring is noted.  Cervical airway is patent.  IMPRESSION: 1. No acute intracranial abnormality. Stable atrophy and chronic white matter disease. 2. No cervical spine acute fracture or subluxation. Diffuse osteopenia. Degenerative changes as described above. Bilateral lung apices scarring.   Electronically Signed   By: Natasha Mead M.D.   On: 02/26/2013 09:26    EKG Interpretation   None       MDM   1. Back contusion, right, initial encounter   2. Contusion, hip, right, initial encounter    Patient with no evidence of fracture. She has been able to ambulate a few steps in the ED and did not appear to be in significant discomfort. Her pulse seems to be mechanical in nature and there is no other red flags to be concerning for arrhythmia, infection or stroke. She was discharged back to  [facility] with her daughter. She's encouraged to followup with her primary care physician if her symptoms continue.    Rolan Bucco, MD 02/26/13 [phone]

## 2013-02-27 ENCOUNTER — Encounter: Payer: Self-pay | Admitting: Geriatric Medicine

## 2013-02-27 ENCOUNTER — Non-Acute Institutional Stay: Payer: Medicare Other | Admitting: Geriatric Medicine

## 2013-02-27 DIAGNOSIS — M25551 Pain in right hip: Secondary | ICD-10-CM

## 2013-02-27 DIAGNOSIS — M25559 Pain in unspecified hip: Secondary | ICD-10-CM | POA: Insufficient documentation

## 2013-02-27 NOTE — Assessment & Plan Note (Signed)
Patient is complaining of right hip pain today when she walks NSAIDs, review of record shows she's had this complaint for some time to clear this is worse than usual since her fall 2 days ago. Imaging studies in the ED and negative for acute fractures. Continue scheduled acetaminophen can augment this with low-dose hydrocodone. Recommend t.i.d. ice application to this area for the next couple of days to help reduce inflammation.

## 2013-02-27 NOTE — Progress Notes (Signed)
Patient ID: Vanessa Mcbride, female   DOB: [birth year], 79 y.o.   MRN: [id number]  [facility] (13)  Code Status: DNR     Contact Information   Name Relation Home Work Vanessa Mcbride Daughter [phone]  [phone]      Chief Complaint  Patient presents with  . pain after fall    HPI: This is a 79 y.o. female resident of [facility]. Evaluation is requested today due to continued pain after a fall on December 6.  Patient had a fall in her bathroom o she was patient was at the sink and fell backwards. Nurse evaluation noted an abrasion to her back no other injury. Later the patient complained of right hip pain. She was able to bear weight and ambulate. On-call provider was notified and an x-ray was ordered. There was no response from the mobile x-ray provider, On-call provider then recommended ED evaluation. Patient's daughter preferred to wait until the morning for further evaluation. Pain was treated with tramadol, patient reportedly rested well overnight. The next morning patient's daughter took her to the emergency department for evaluation. Imaging the patient's lumbar spine, right hip as well as head and neck are negative for acute changes. Patient was returned to [facility] living at [facility]. She has continued to receive some tramadol last dose was 9:30 last night. There is some concern about worsening confusion with this medication, request is made to stop it and change to p.r.n. hydrocodone.     Allergies  Allergen Reactions  . Iodine Anaphylaxis  . Iohexol      Desc: pt has had contrast in the past (years ago) and it causea anaphylaxis- per pt's daughter.  Vanessa Mcbride,rtrct, Onset Date: [id number]    Medication reviewed   DATA REVIEWED  Radiologic Exams 02/26/2013  CT HEAD WITHOUT CONTRAST, CT CERVICAL SPINE WITHOUT CONTRAST IMPRESSION: 1. No acute intracranial abnormality. Stable atrophy and  chronic white matter disease. 2. No cervical spine acute fracture or subluxation. Diffuse osteopenia. Degenerative changes as described above. Bilateral lung apices scarring.   RIGHT HIP - COMPLETE 2+ VIEW COMPARISON:  None. IMPRESSION: Mild osteoarthritis   LUMBAR SPINE - COMPLETE 4+ VIEW  COMPARISON:  None. IMPRESSION: No acute fracture or subluxation. Mild degenerative changes. Diffuse osteopenia. Mild levoscoliosis.  Cardiovascular Exams:   Laboratory Studies  Hospital lab 03/15/2012 TPC 6.2, hemoglobin 12.8, hematocrit 37.4, platelets 189  Glucose 125, BUN 10. 10, creatinine 0.42, sodium 132, potassium 3.8   [facility] 09/20/2012 WBC 3.8, hemoglobin 12.9, hematocrit 38.0, platelet 223  Glucose 88, BUN 13, creatinine 0.58, sodium 141, potassium 4.2 protein/LFTs WNL.  Total cholesterol 147, triglycerides 65, HDL 69, LDL 65  TSH 1.1      Review of Systems  DATA OBTAINED: from patient, medical record, caregiver Vanessa Mcbride) GENERAL: Feels well   No fevers, fatigue, change in appetite or weight SKIN: No itch, rash or open wounds MOUTH/THROAT: No mouth or tooth pain  No sore throat   No difficulty chewing or swallowing  Occ cough w/ meals RESPIRATORY: No cough, wheezing, SOB CARDIAC: No chest pain, palpitations  No edema. GU: No dysuria, frequency or urgency  No change in urine volume or character   MUSCULOSKELETAL: No joint pain, swelling. Stiffness bilateral shoulders, rt. > left., some discomfort rt. Hip when walking/sitting (unclear if this is changed from baseline)  No back pain  No muscle ache, pain, weakness  Gait is unsteady    NEUROLOGIC: No dizziness,  fainting, headache,  No change in mental status.  PSYCHIATRIC: Intermittent anxiety, no depression Sleeps well.  No behavior issue.    Physical Exam Filed Vitals:   02/27/13 1214  BP: 125/66  Pulse: 57  Temp: 97.7 F (36.5 C)  Resp: 16  SpO2: 95%   There is no weight on file to calculate BMI.  GENERAL  APPEARANCE: No acute distress, appropriately groomed, normal body habitus. Alert, pleasant, minimal conversation due to  Language barrier. Answers questions appropriately SKIN: No diaphoresis, rash, unusual lesions, wounds HEAD: Normocephalic, atraumatic RESPIRATORY: Breathing is even, unlabored. Lung sounds are clear and full.  CARDIOVASCULAR: Heart RRR. No murmur or extra heart sounds MUSCULOSKELETAL: Moves all extremities slowly with full ROM of LE, decreased extension both shoulders. Adequate strength and tone all extremities. Back with kyphosis, No scoliosis or spinal process tenderness. Gait is unsteady. Tenderness rt. Posterior hip. NEUROLOGIC: Oriented to time, place, person. Bilateral hand tremor RT>Left.  PSYCHIATRIC: Mood and affect appropriate to situation  ASSESSMENT/PLAN  Hip pain Patient is complaining of right hip pain today when she walks NSAIDs, review of record shows she's had this complaint for some time to clear this is worse than usual since her fall 2 days ago. Imaging studies in the ED and negative for acute fractures. Continue scheduled acetaminophen can augment this with low-dose hydrocodone. Recommend t.i.d. ice application to this area for the next couple of days to help reduce inflammation.    Follow up: As scheduled in [facility]  Lab 03/2013 BMP, lipid panel  Vanessa Mcbride T.Vanessa Armor, NP-C 02/27/2013

## 2013-03-02 DIAGNOSIS — H2640 Unspecified secondary cataract: Secondary | ICD-10-CM | POA: Diagnosis not present

## 2013-03-18 DIAGNOSIS — R300 Dysuria: Secondary | ICD-10-CM | POA: Diagnosis not present

## 2013-03-18 DIAGNOSIS — R350 Frequency of micturition: Secondary | ICD-10-CM | POA: Diagnosis not present

## 2013-03-28 DIAGNOSIS — E785 Hyperlipidemia, unspecified: Secondary | ICD-10-CM | POA: Diagnosis not present

## 2013-03-28 DIAGNOSIS — I10 Essential (primary) hypertension: Secondary | ICD-10-CM | POA: Diagnosis not present

## 2013-03-28 LAB — BASIC METABOLIC PANEL
BUN: 8 mg/dL (ref 4–21)
Creatinine: 0.5 mg/dL (ref 0.5–1.1)
Glucose: 96 mg/dL
POTASSIUM: 4.1 mmol/L (ref 3.4–5.3)
Sodium: 140 mmol/L (ref 137–147)

## 2013-03-28 LAB — LIPID PANEL
CHOLESTEROL: 138 mg/dL (ref 0–200)
HDL: 76 mg/dL — AB (ref 35–70)
LDL CALC: 39 mg/dL
Triglycerides: 113 mg/dL (ref 40–160)

## 2013-03-30 DIAGNOSIS — H2640 Unspecified secondary cataract: Secondary | ICD-10-CM | POA: Diagnosis not present

## 2013-03-30 DIAGNOSIS — H26499 Other secondary cataract, unspecified eye: Secondary | ICD-10-CM | POA: Diagnosis not present

## 2013-04-03 ENCOUNTER — Encounter: Payer: Self-pay | Admitting: Internal Medicine

## 2013-04-03 ENCOUNTER — Non-Acute Institutional Stay: Payer: Medicare Other | Admitting: Internal Medicine

## 2013-04-03 VITALS — BP 110/58 | HR 98 | Wt 133.0 lb

## 2013-04-03 DIAGNOSIS — I10 Essential (primary) hypertension: Secondary | ICD-10-CM

## 2013-04-03 DIAGNOSIS — G20 Parkinson's disease: Secondary | ICD-10-CM | POA: Diagnosis not present

## 2013-04-03 DIAGNOSIS — F0280 Dementia in other diseases classified elsewhere without behavioral disturbance: Secondary | ICD-10-CM

## 2013-04-03 NOTE — Progress Notes (Signed)
Patient ID: Vanessa Mcbride, female   DOB: [birth year], 80 y.o.   MRN: [id number]    Location:  [facility] (12)    Allergies  Allergen Reactions  . Iodine Anaphylaxis  . Iohexol      Desc: pt has had contrast in the past (years ago) and it causea anaphylaxis- per pt's daughter.  Vanessa Mcbride,rtrct, Onset Date: [id number]     Chief Complaint  Patient presents with  . Medical Managment of Chronic Issues    Parkinson, blood pressure, dementia With Vanessa Mcbride [employer], Vanessa Mcbride employee of daughter    HPI:  HTN (hypertension): controlled. Today's BP is a little low.  Parkinson disease: stable. Some increased rigidity and tremor. Unstable gait.  Dementia in conditions classified elsewhere without behavioral disturbance(294.10): unchanged.  Prior problems with fractures of wrist and arm have resolved and she denies any pain.    Medications: Patient's Medications  New Prescriptions   No medications on file  Previous Medications   ACETAMINOPHEN (TYLENOL) 325 MG TABLET    Take 650 mg by mouth every 6 (six) hours as needed.   AMLODIPINE (NORVASC) 2.5 MG TABLET    Take 2.5 mg by mouth daily.   ANTISEPTIC ORAL RINSE (BIOTENE) LIQD    15 mLs by Mouth Rinse route. Use 4 times daily before meals and bedtime   ASPIRIN 81 MG TABLET    Take 81 mg by mouth daily.   CARBIDOPA-LEVODOPA (SINEMET IR) 25-100 MG PER TABLET    Take 1-1.5 tablets by mouth 4 (four) times daily. Pt takes 1.5 tablets in the am and 1 tablet at 11a, 4p, 7p   DICLOFENAC SODIUM (VOLTAREN) 1 % GEL    Apply 2 g topically. Use 2 grams to each shoulder three times daily to reduce pain   ESCITALOPRAM (LEXAPRO) 10 MG TABLET    Take 10 mg by mouth daily.   HYDROCODONE-ACETAMINOPHEN (NORCO/VICODIN) 5-325 MG PER TABLET    Take 1 tablet by mouth every 8 (eight) hours as needed for moderate pain.   LOPERAMIDE (IMODIUM) 2 MG CAPSULE    Take 2 mg by mouth every 6 (six) hours as needed for  diarrhea or loose stools.   MELATONIN 10 MG TABS    Take 1 tablet by mouth at bedtime as needed. sleep   METOPROLOL SUCCINATE (TOPROL-XL) 25 MG 24 HR TABLET    Take 12.5 mg by mouth at bedtime.   MULTIPLE VITAMIN (MULTIVITAMIN WITH MINERALS) TABS    Take 1 tablet by mouth at bedtime.  Modified Medications   No medications on file  Discontinued Medications   SIMVASTATIN (ZOCOR) 10 MG TABLET    Take 10 mg by mouth at bedtime.     Review of Systems  Constitutional: Negative for fever, chills, diaphoresis, activity change, appetite change and unexpected weight change.  HENT: Negative.   Eyes: Negative.   Respiratory: Negative.   Cardiovascular: Negative.   Gastrointestinal: Negative.   Endocrine: Negative.   Genitourinary: Negative.   Musculoskeletal:       Hx of fracture of the left radius and of the right humerus.  Skin:       Areas of dryness and redness on the superior aspect pf both shoulders. Red cheeks after hot drinks.  Neurological: Positive for tremors. Negative for seizures, syncope, facial asymmetry, speech difficulty, weakness, numbness and headaches.       Loss of memory. So far this appears to be reasonably mild. Patient denies focal weakness.  Hematological: Negative.   Psychiatric/Behavioral: Positive for sleep disturbance (Using melatonin for rest sometimes).    Filed Vitals:   04/03/13 1432  BP: 110/58  Pulse: 98  Weight: 133 lb (60.328 kg)   Physical Exam  Constitutional:  Frail elderly female in no acute distress.  HENT:  Head: Normocephalic and atraumatic.  Right Ear: External ear normal.  Left Ear: External ear normal.  Nose: Nose normal.  Eyes: Conjunctivae and EOM are normal. Pupils are equal, round, and reactive to light.  Neck: Normal range of motion. Neck supple. No JVD present. No tracheal deviation present. No thyromegaly present.  Cardiovascular: Normal rate, regular rhythm, normal heart sounds and intact distal pulses.  Exam reveals no gallop  and no friction rub.   No murmur heard. Pulmonary/Chest: Effort normal and breath sounds normal. No respiratory distress. She has no wheezes. She has no rales. She exhibits no tenderness.  Abdominal: Soft. Bowel sounds are normal. She exhibits no distension and no mass. There is no tenderness.  Musculoskeletal:  Tender in the mid upper half of the right humerus. Tender at the left wrist. No specific swelling or erythema at either of these areas. Gait appears to be slightly unstable. There is some difficulty with raising the right arm at the shoulder.  Lymphadenopathy:    She has no cervical adenopathy.  Neurological:  Mild memory loss. Cranial nerves intact no specific problems with coordination. She does have a tremor when arms are extended period. She has previously been diagnosed as having Parkinson's disease.  Skin: Skin is warm and dry. No pallor.  Tops of shoulders are slightly rd and scaling. Dry skin.  Psychiatric: She has a normal mood and affect. Her behavior is normal. Thought content normal.     Labs reviewed: Last lab in Dec 2014 was OK    Assessment/Plan 1. HTN (hypertension) Controlled. Remains on amlodipine 2.5 mg  2. Parkinson disease Stable. Continues to see Dr. Rexene Alberts.  3. Dementia in conditions classified elsewhere without behavioral disturbance(294.10) unchanged

## 2013-04-12 ENCOUNTER — Encounter: Payer: Self-pay | Admitting: Geriatric Medicine

## 2013-04-12 ENCOUNTER — Non-Acute Institutional Stay: Payer: Medicare Other | Admitting: Geriatric Medicine

## 2013-04-12 VITALS — BP 122/62 | HR 76 | Ht 60.0 in | Wt 135.0 lb

## 2013-04-12 DIAGNOSIS — R222 Localized swelling, mass and lump, trunk: Secondary | ICD-10-CM

## 2013-04-12 DIAGNOSIS — D1739 Benign lipomatous neoplasm of skin and subcutaneous tissue of other sites: Secondary | ICD-10-CM | POA: Insufficient documentation

## 2013-04-12 NOTE — Progress Notes (Signed)
Patient ID: Vanessa Mcbride, female   DOB: 1924/10/24, 80 y.o.   MRN: [id number]  [facility] [phone])  Code Status: DNR Contact Information   Name Relation Home Work [place] Daughter [phone]  [phone]      Chief Complaint  Patient presents with  . Cyst    left clavicle, has had it for many years, getting larger    HPI: This is a 80 y.o. female resident of [facility]. Evaluation is requested today due to swelling above the left clavicle.  Caregiver first noted this swelling over the weekend reports was approximately 4 cm diameter. There is a note on the patient's chart that she's had this swelling for some time, "lipoma". There's been no acute change in patient's appetite, activity level. She denies any pain. She's not had any GI symptoms and no weight loss.    Allergies  Allergen Reactions  . Iodine Anaphylaxis  . Iohexol      Desc: pt has had contrast in the past (years ago) and it causea anaphylaxis- per pt's daughter.  stephanie davis,rtrct, Onset Date: [id number]    MEDICATION- reviewed   DATA REVIEWED  Radiologic Exams 02/26/2013  CT HEAD WITHOUT CONTRAST, CT CERVICAL SPINE WITHOUT CONTRAST IMPRESSION: 1. No acute intracranial abnormality. Stable atrophy and chronic white matter disease. 2. No cervical spine acute fracture or subluxation. Diffuse osteopenia. Degenerative changes as described above. Bilateral lung apices scarring.   RIGHT HIP - COMPLETE 2+ VIEW COMPARISON:  None. IMPRESSION: Mild osteoarthritis   LUMBAR SPINE - COMPLETE 4+ VIEW  COMPARISON:  None. IMPRESSION: No acute fracture or subluxation. Mild degenerative changes. Diffuse osteopenia. Mild levoscoliosis.  Cardiovascular Exams:   Laboratory Studies  Hospital lab 03/15/2012 TPC 6.2, hemoglobin 12.8, hematocrit 37.4, platelets 189  Glucose 125, BUN 10. 10, creatinine 0.42, sodium 132, potassium  3.8   [facility] 09/20/2012 WBC 3.8, hemoglobin 12.9, hematocrit 38.0, platelet 223  Glucose 88, BUN 13, creatinine 0.58, sodium 141, potassium 4.2 protein/LFTs WNL.  Total cholesterol 147, triglycerides 65, HDL 69, LDL 65  TSH 1.1     Lab Results- [facility] 03/28/2013  Component Value   GLUCOSE 96   TRIG 113   LDLCALC 39   NA 140   K 4.1   CREATININE 0.5   BUN 8     REVIEW OF SYSTEMS DATA OBTAINED: from patient, medical record, caregiver [name]) GENERAL: Feels well   No fevers, fatigue, change in appetite or weight SKIN: No itch, rash or open wounds MOUTH/THROAT: No mouth or tooth pain  No sore throat   No difficulty chewing or swallowing  Occ cough w/ meals/pills RESPIRATORY: No cough, wheezing, SOB CARDIAC: No chest pain, palpitations  No edema. GU: No dysuria, frequency or urgency  No change in urine volume or character   MUSCULOSKELETAL: No joint pain, swelling. Stiffness bilateral shoulders, rt. > left., some discomfort rt. Hip when walking/sitting (unclear if this is changed from baseline)  No back pain  No muscle ache, pain, weakness  Gait is unsteady    NEUROLOGIC: No dizziness, fainting, headache,  No change in mental status.  PSYCHIATRIC: Intermittent anxiety, no depression Sleeps well.  No behavior issue.    PHYSICAL EXAM  Filed Vitals:   04/12/13 1312  BP: 122/62  Pulse: 76  Height: 5' (1.524 m)  Weight: 135 lb (61.236 kg)   Body mass index is 26.37 kg/(m^2).  GENERAL APPEARANCE: No acute distress, appropriately groomed, normal body habitus.  Alert, pleasant, minimal conversation due to  Language barrier. Answers questions appropriately SKIN: No diaphoresis, rash, unusual lesions, wounds HEAD: Normocephalic, atraumatic EYES: Conjunctiva/lids clear. Pupils round, reactive.  MOUTH/THROAT: Lips w/o lesions. Oral mucosa, tongue moist, w/o lesion. Oropharynx w/o redness or lesions.  NECK: Supple, full ROM. No thyroid tenderness, enlargement or nodule LYMPHATICS:  No head, neck adenopathy. Supraclavicular fullness with a soft, movable, nontender mass approximately 6 x 6 cm. No difficulty swallowing RESPIRATORY: Breathing is even, unlabored. Lung sounds are clear and full.  CARDIOVASCULAR: Heart RRR. No murmur or extra heart sounds MUSCULOSKELETAL: Gait is unsteady, ambulates with assistance of caregiver NEUROLOGIC: Oriented to time, place, person. Bilateral hand tremor RT>Left.  PSYCHIATRIC: Mood and affect appropriate to situation  ASSESSMENT/PLAN  Supraclavicular mass Left supraclavicular mass, ? Lipoma vs. lymph node. The mass is soft and movable and nontender. Patient has had no acute changes, specifically no weight loss or GI symptoms. Favor observing this for any further changes. Will discuss with patient's daughter, Dr.Quinby.    Follow up: As scheduled in [facility]    Lakiyah Arntson T.Alexsis Kathman, NP-C 04/12/2013

## 2013-04-12 NOTE — Assessment & Plan Note (Signed)
Left supraclavicular mass, ? Lipoma vs. lymph node. The mass is soft and movable and nontender. Patient has had no acute changes, specifically no weight loss or GI symptoms. Favor observing this for any further changes. Will discuss with patient's daughter, Dr.Gravelle.

## 2013-04-25 ENCOUNTER — Encounter: Payer: Self-pay | Admitting: Neurology

## 2013-04-25 ENCOUNTER — Encounter (INDEPENDENT_AMBULATORY_CARE_PROVIDER_SITE_OTHER): Payer: Self-pay

## 2013-04-25 ENCOUNTER — Ambulatory Visit (INDEPENDENT_AMBULATORY_CARE_PROVIDER_SITE_OTHER): Payer: Medicare Other | Admitting: Neurology

## 2013-04-25 VITALS — BP 135/70 | HR 62 | Temp 96.7°F | Ht 60.0 in | Wt 134.0 lb

## 2013-04-25 DIAGNOSIS — R413 Other amnesia: Secondary | ICD-10-CM

## 2013-04-25 DIAGNOSIS — G20 Parkinson's disease: Secondary | ICD-10-CM

## 2013-04-25 DIAGNOSIS — G479 Sleep disorder, unspecified: Secondary | ICD-10-CM

## 2013-04-25 NOTE — Patient Instructions (Signed)
We will again increase her Sinemet slightly. Please take one and a half pills at 7 AM, one pill at 11 AM, 1-1/2 pills at 4 PM and one pill at 7 PM. I would also like to have you come back for sleep study. Please discuss this with your daughter, [name], and have her or Bo call us back to schedule the sleep study which is an overnight sleep test to look for evidence of obstructive sleep apnea.   I will see you back in 3 months.

## 2013-04-25 NOTE — Progress Notes (Signed)
Subjective:    Patient ID: Vanessa Mcbride is a 80 y.o. female.  HPI    Interim history:   Vanessa Mcbride is a very friendly 80 year old right-handed woman with an underlying medical history of hyperlipidemia, hypertension, osteoarthritis, insomnia, memory loss, anxiety and history of fall with fracture of her left arm in 2014, who presents for followup consultation of her right-sided predominant Parkinson's disease. She is accompanied by one of her caretakers from [place], Vanessa Mcbride, and another caretaker, Vanessa Mcbride, who works for her Daughter Vanessa Mcbride) again today. I last saw her on 12/23/2012, and which time I increased her Sinemet to 4 times daily from tid, as I felt that she had worsened since her previous visit. She reported worse memory, poor sleep, and occasional muscle jerks. I continued her on 1-1/2 pills for the first dose and one pill each time for the last 3 doses.  Today, she reports, that the increase in C/L was helpful. She has fallen unfortunately, but thankfully did not hurt herself, but did have some bruising. She takes melatonin for sleep and Vanessa Mcbride has noted during night shift that Vanessa Mcbride has some apneas and she snores. She never had a sleep study. She had a rough time last week. She had some vomiting. She may have had a stomach bug. She is feeling well today. She denies any pain today.  I saw her on 09/21/2012, at which time I increased her morning dose of Sinemet to 1-1/2 pills and otherwise she was to continue her midday dose at one pill and her afternoon dose at one pill. Prior to that I saw her on 06/09/2012. I did not make any medication changes in 3/14. Her primary care physician encouraged her to use her walker or cane but she does not like to use them. She holds onto her caretaker.   Her Past Medical History Is Significant For: Past Medical History  Diagnosis Date  . Parkinson's disease   . Stroke   . Hypertension   . Arthritis     osteoarthritis  . Scoliosis   . Memory loss  06/09/2012  . Aortic valve disorders 04/11/2012  . Dementia in conditions classified elsewhere without behavioral disturbance 04/07/2012  . Hyperlipidemia   . Osteoarthrosis, unspecified whether generalized or localized, unspecified site   . Insomnia, unspecified 03/20/2012  . Fracture closed, humerus 03/20/2012  . Other closed fractures of distal end of radius (alone) 03/20/2012  . Transient ischemic attack (TIA), and cerebral infarction without residual deficits(V12.54)   . Unspecified constipation   . Debility, unspecified 03/18/2012  . Anxiety   . Generalized anxiety disorder     Her Past Surgical History Is Significant For: Past Surgical History  Procedure Laterality Date  . Breast surgery      benign lumpectomy  . Cholecystectomy      Her Family History Is Significant For: Family History  Problem Relation Age of Onset  . Heart disease Son     CAD  . Diabetes Mother   . Diabetes Sister   . Hypertension Sister     Her Social History Is Significant For: History   Social History  . Marital Status: Widowed    Spouse Name: N/A    Number of Children: N/A  . Years of Education: N/A   Occupational History  . retired Armed forces operational officer    Social History Main Topics  . Smoking status: Never Smoker   . Smokeless tobacco: Never Used  . Alcohol Use: No  . Drug Use: No  .  Sexual Activity: No   Other Topics Concern  . None   Social History Narrative   Widowed 02/2012 (after 23 yr marriage). [place], understands English fairly well, speaks English poorly. Occupation: Retired Armed forces operational officer. Moved form [place] to [place] 03/2012,     Lives at [employer],  [place] section. Has 24 hour caregivers.   Advanced Directives:  DNR, MOST (05/2012)        Her Allergies Are:  Allergies  Allergen Reactions  . Iodine Anaphylaxis  . Iohexol      Desc: pt has had contrast in the past (years ago) and it causea anaphylaxis- per pt's daughter.  Vanessa Mcbride,Vanessa Mcbride, Onset Date: [id number]   :   Her Current Medications Are:  Outpatient Encounter Prescriptions as of 04/25/2013  Medication Sig  . acetaminophen (TYLENOL) 325 MG tablet Take 650 mg by mouth every 6 (six) hours as needed.  Marland Kitchen amLODipine (NORVASC) 2.5 MG tablet Take 2.5 mg by mouth daily.  Marland Kitchen antiseptic oral rinse (BIOTENE) LIQD 15 mLs by Mouth Rinse route. Use 4 times daily before meals and bedtime  . aspirin 81 MG tablet Take 81 mg by mouth daily.  . carbidopa-levodopa (SINEMET IR) 25-100 MG per tablet Take 1-1.5 tablets by mouth 4 (four) times daily. Pt takes 1.5 tablets in the am and 1 tablet at 11a, 4p, 7p  . diclofenac sodium (VOLTAREN) 1 % GEL Apply 2 g topically. Use 2 grams to each shoulder three times daily to reduce pain  . escitalopram (LEXAPRO) 10 MG tablet Take 10 mg by mouth daily.  Marland Kitchen HYDROcodone-acetaminophen (NORCO/VICODIN) 5-325 MG per tablet Take 1 tablet by mouth every 8 (eight) hours as needed for moderate pain.  Marland Kitchen loperamide (IMODIUM) 2 MG capsule Take 2 mg by mouth every 6 (six) hours as needed for diarrhea or loose stools.  . Melatonin 10 MG TABS Take 1 tablet by mouth at bedtime as needed. sleep  . metoprolol succinate (TOPROL-XL) 25 MG 24 hr tablet Take 12.5 mg by mouth at bedtime.  . Multiple Vitamin (MULTIVITAMIN WITH MINERALS) TABS Take 1 tablet by mouth at bedtime.  :  Review of Systems:  Out of a complete 14 point review of systems, all are reviewed and negative with the exception of these symptoms as listed below:   Review of Systems  Constitutional: Positive for activity change.  HENT: Negative.   Eyes: Negative.   Respiratory: Positive for cough.   Cardiovascular: Negative.   Gastrointestinal: Positive for diarrhea.  Endocrine: Negative.   Genitourinary: Negative.   Musculoskeletal: Positive for neck stiffness.  Skin: Negative.   Allergic/Immunologic: Positive for food allergies.  Neurological: Positive for tremors.       Memory loss   Hematological: Positive for adenopathy.  Psychiatric/Behavioral: Positive for sleep disturbance (apnea, frequent waking).    Objective:  Neurologic Exam  Physical Exam Physical Examination:   Filed Vitals:   04/25/13 1157  BP: 135/70  Pulse: 62  Temp: 96.7 F (35.9 C)    General Examination: The patient is a very pleasant 80 y.o. female in no acute distress. She appears frail and is well groomed.   HEENT exam: Normocephalic, atraumatic, moderate facial masking is noted. She has no lip, neck or jaw tremor. Neck tone is moderately elevated. She has normal range of motion. Hearing is intact. Speech is otherwise clear. Oropharynx exam reveals no abnormalities. Pupils are reactive to light and extraocular tracking is fair with moderate saccadic breakdown of smooth pursuit. Chest is clear to auscultation  with no wheezing or rhonchi. Heart sounds are normal without murmurs, rubs or gallops. Abdomen is soft, nontender. Bowel sounds are appreciated. She has no pitting edema today. Neurologically: Mental status: The patient is awake, alert and oriented to self, and circumstance. She cannot tell the date, day of the week, month or year, always off by one. Cranial nerves are as described under HEENT exam. Motor exam: She has a thin bulk. Strength is normal for age. Her left arm mobility is better. Tone is increased on the right side with moderate cogwheeling noted in the  right upper extremity and a little milder on the L. She has an intermittent resting tremor in the R>L UEs. She has significant slowness, worse than before. She has severe impairment of finger taps on the R and moderate to severe impairment on the L. Same with hand movements. She has severe impairment of foot taps on the R and moderately so on the L. She is able to stand up from the seated position with assistance. She walks with assistance. She has a moderately stooped posture. She walks with a mild shuffle and needs assistance by  holding onto [place]. She does have a decreased stride length and almost absent arm swing bilaterally. She turns in 3 steps. Reflexes are 1+ in the upper extremity on the right and 2+ in the lower extremities. Sensory exam is intact to light touch throughout.   Assessment and plan:    In summary, Vanessa Mcbride is a very pleasant 80 year old lady with a Parkinson's disease, right sided predominant with overall moderate to severe findings and Sx and exam are somewhat worse since her last visit. She is advised to increase Sinemet slightly again. We will do a cautious increase for fear of side effects and considering her advanced age. She is in agreement. To that end, I have asked her to alternate one and half pills with one pill every day, therefore, she will take 1-1/2 pills at 7 AM, 1 pill at 11 AM, 1-1/2 pills at 4 PM, and one pill at 7 PM. In the future, we can discuss increasing all her doses to 1 1/2 pills, but I would like to do this gradually. For his sleep disturbance and her description of sleep apnea, I would like to try to get her in for sleep study. She will have to be a one-on-one and she will need assistance from one of her caretakers overnight. To that end, I have asked her caretakers and the patient to discuss this with the patient's daughter, Vanessa Mcbride and give [place] a call back if we can proceed with a sleep study. She can continue taking melatonin at night for sleep. She is advised to use assistance for walking at all times. I discussed my findings with her and her caretakers and  also filled out her paperwork from her assisted living facility as well as provided him with written instructions. I would like to see her back in 3 months, sooner if the need arises. They were in agreement.

## 2013-05-05 ENCOUNTER — Telehealth: Payer: Self-pay | Admitting: Neurology

## 2013-05-05 NOTE — Telephone Encounter (Signed)
Patient's daughter calling to request sooner appointment than scheduled 09/13/13 with Dr. Rexene Alberts. Patient was offered first available for the 3 month follow up, but it is way past 3 months and daughter wants her to be seen sooner due to new medication. Please call daughter and advise.

## 2013-05-08 NOTE — Telephone Encounter (Signed)
Made patient an appointment for 06-26-2013 with Dr. Rexene Alberts

## 2013-06-09 ENCOUNTER — Telehealth: Payer: Self-pay | Admitting: Neurology

## 2013-06-09 NOTE — Telephone Encounter (Signed)
Daughter returned Aimee's call. She asked if Aimee could leave an appointment on her voice mail if she is unable to answer. She has asked for an earlier appoint for her mother as she needs to be seen.  She will make sure that she gets here and everything, but just needs the appointment. Please call [phone].  Thank you

## 2013-06-09 NOTE — Telephone Encounter (Signed)
Called patient to reschedule the 06/26/13 appointment per Dr. Guadelupe Sabin schedule but patient's daughter states she just went to visit patient and has noticed that she is shaking a lot more and really needs to be seen as soon as possible. Please call patient's daughter and advise.

## 2013-06-12 NOTE — Telephone Encounter (Signed)
Called daughter and scheduled /confirmed appointment with NP, ok'd per Dr Rexene Alberts.  Daughter is also requesting later appt in August with Dr Athar/scheduled.

## 2013-06-26 ENCOUNTER — Ambulatory Visit: Payer: Self-pay | Admitting: Neurology

## 2013-07-05 ENCOUNTER — Non-Acute Institutional Stay: Payer: Medicare Other | Admitting: Geriatric Medicine

## 2013-07-05 ENCOUNTER — Encounter: Payer: Self-pay | Admitting: Geriatric Medicine

## 2013-07-05 VITALS — BP 114/60 | HR 76 | Wt 142.0 lb

## 2013-07-05 DIAGNOSIS — R32 Unspecified urinary incontinence: Secondary | ICD-10-CM | POA: Diagnosis not present

## 2013-07-05 DIAGNOSIS — G20 Parkinson's disease: Secondary | ICD-10-CM

## 2013-07-05 DIAGNOSIS — I10 Essential (primary) hypertension: Secondary | ICD-10-CM | POA: Diagnosis not present

## 2013-07-05 DIAGNOSIS — K5900 Constipation, unspecified: Secondary | ICD-10-CM

## 2013-07-05 HISTORY — DX: Unspecified urinary incontinence: R32

## 2013-07-05 NOTE — Assessment & Plan Note (Signed)
Caregiver reports use of prunes on occasion to help with bowels. Notes no bowel movement in the last 2 days, also exam with decreased bowel sounds. Recommend prunes on a daily basis, continue to monitor, may be impacting urinary function

## 2013-07-05 NOTE — Assessment & Plan Note (Signed)
Recent blood pressure range satisfactory, continue current medication. Recent lab satisfactory

## 2013-07-05 NOTE — Assessment & Plan Note (Signed)
Recent worsening of urinary incontinence and nighttime urinary frequency may be related to constipation. Continue to monitor for signs of urinary tract infection

## 2013-07-05 NOTE — Progress Notes (Signed)
Patient ID: Vanessa Mcbride, female   DOB: Apr 16, 1924, 80 y.o.   MRN: [id number]  [facility] [phone])  Code Status: DNR Contact Information   Name Relation Home Work Orin Daughter [phone]  [phone]      Chief Complaint  Patient presents with  . Medical Managment of Chronic Issues    blood pressure, Parkinson's    HPI: This is a 80 y.o. female resident of [facility],  Assisted Living section evaluated today for management of ongoing medical issues.    Recent visits:  04/12/2013 Supraclavicular mass Left supraclavicular mass, ? Lipoma vs. lymph node. The mass is soft and movable and nontender. Patient has had no acute changes, specifically no weight loss or GI symptoms. Favor observing this for any further changes. Will discuss with patient's daughter, Dr.Montalban.   02/27/2014 Hip pain Patient is complaining of right hip pain today when she walks NSAIDs, review of record shows she's had this complaint for some time to clear this is worse than usual since her fall 2 days ago. Imaging studies in the ED and negative for acute fractures. Continue scheduled acetaminophen can augment this with low-dose hydrocodone. Recommend t.i.d. ice application to this area for the next couple of days to help reduce inflammation.  01/04/2013 Parkinson disease Mobility issues related to Parkinson's disease, managing very well with assistance of 24-hour caregivers. Sinemet dose was increased recently by neurology, patient reports improved sleeping. The swallowing issue that manifested as coughing with meals improved with SLP interventions. Continue current medications and interventions to maintain mobility and activity level.  Dementia in conditions classified elsewhere without behavioral disturbance(294.10) Difficult to measure; patient is assisted with all ADLs and interactions by a private caregivers. Nurse manages medications, meals are  prepared and served in assisted living setting.   Hyperlipidemia Patient's recent lipid panel all within normal limits. Patient does have history of TIA. Will continue low-dose statin for now, recheck lipid levels January 2015  Generalized anxiety disorder Anxiety is being managed well with current medication and interventions by caregivers  Since last visit no acute medical issues. Lab studies January 2015 all satisfactory, Lipitor was discontinued due to very low cholesterol levels.  Care plan was completed by nursing staff and caregivers in February 2015. Requires assistance with all ADLs, continues to have private caregiver 24 7. Caregiver reports that this patient is often tired after lunch, patient is coughing more with fluids and does require more cuing in general. She has noticed increased incontinence in the evening for about the last week. Last night she was up every hour to urinate.    Allergies  Allergen Reactions  . Iodine Anaphylaxis  . Iohexol      Desc: pt has had contrast in the past (years ago) and it causea anaphylaxis- per pt's daughter.  stephanie davis,rtrct, Onset Date: [id number]    MEDICATION- reviewed   DATA REVIEWED  Radiologic Exams 02/26/2013  CT HEAD WITHOUT CONTRAST, CT CERVICAL SPINE WITHOUT CONTRAST IMPRESSION: 1. No acute intracranial abnormality. Stable atrophy and chronic white matter disease. 2. No cervical spine acute fracture or subluxation. Diffuse osteopenia. Degenerative changes as described above. Bilateral lung apices scarring.   RIGHT HIP - COMPLETE 2+ VIEW COMPARISON:  None. IMPRESSION: Mild osteoarthritis   LUMBAR SPINE - COMPLETE 4+ VIEW  COMPARISON:  None. IMPRESSION: No acute fracture or subluxation. Mild degenerative changes. Diffuse osteopenia. Mild levoscoliosis.  Cardiovascular Exams:   Laboratory Studies  Hospital lab 03/15/2012 TPC 6.2, hemoglobin  12.8, hematocrit 37.4, platelets 189  Glucose 125, BUN 10. 10,  creatinine 0.42, sodium 132, potassium 3.8   [facility] 09/20/2012 WBC 3.8, hemoglobin 12.9, hematocrit 38.0, platelet 223  Glucose 88, BUN 13, creatinine 0.58, sodium 141, potassium 4.2 protein/LFTs WNL.  Total cholesterol 147, triglycerides 65, HDL 69, LDL 65  TSH 1.1     Lab Results  Component Value Date   NA 140 03/28/2013   K 4.1 03/28/2013   GLU 96 03/28/2013   BUN 8 03/28/2013   CREATININE 0.5 03/28/2013    Lab Results  Component Value Date   CHOL 138 03/28/2013   HDL 76* 03/28/2013   LDLCALC 39 03/28/2013   TRIG 113 03/28/2013     REVIEW OF SYSTEMS DATA OBTAINED: from patient, medical record, caregiver [name]) GENERAL: Feels well   No fevers, fatigue, change in appetite or weight. Tired after lunch, requires more assistance as the day progresses SKIN: No itch, rash or open wounds MOUTH/THROAT: No mouth or tooth pain  No sore throat   No difficulty chewing or swallowing  Increasing cough w/ pills - see HPI RESPIRATORY: No cough, wheezing, SOB CARDIAC: No chest pain, palpitations  No edema. GU: No dysuria, frequency or urgency  No change in urine volume or character   MUSCULOSKELETAL: No joint pain, swelling.  No back pain  No muscle ache, pain, weakness  Gait is unsteady    NEUROLOGIC: No dizziness, fainting, headache,  No change in mental status.  PSYCHIATRIC: Intermittent anxiety, no depression Sleeps well.  No behavior issue.    PHYSICAL EXAM  Filed Vitals:   07/05/13 1338  BP: 114/60  Pulse: 76  Weight: 142 lb (64.411 kg)   Body mass index is 27.73 kg/(m^2).  GENERAL APPEARANCE: No acute distress, appropriately groomed, normal body habitus. Alert, pleasant, minimal conversation due to  Language barrier. Answers questions appropriately SKIN: No diaphoresis, rash, unusual lesions, wounds HEAD: Normocephalic, atraumatic EYES: Conjunctiva/lids clear. Pupils round, reactive.  MOUTH/THROAT: Lips w/o lesions. Oral mucosa, tongue moist, w/o lesion. Oropharynx w/o redness or  lesions.  NECK: Supple, full ROM. No thyroid tenderness, enlargement or nodule LYMPHATICS: No head, neck adenopathy. Supraclavicular fullness unchanged from previous RESPIRATORY: Breathing is even, unlabored. Lung sounds are clear and full.  CARDIOVASCULAR: Heart RRR. No murmur or extra heart sounds MUSCULOSKELETAL: Gait is unsteady, ambulates with assistance of caregiver NEUROLOGIC: Oriented to time, place, person. Bilateral hand tremor RT>Left.  PSYCHIATRIC: Mood and affect appropriate to situation  ASSESSMENT/PLAN  Urinary incontinence Recent worsening of urinary incontinence and nighttime urinary frequency may be related to constipation. Continue to monitor for signs of urinary tract infection  Unspecified constipation Caregiver reports use of prunes on occasion to help with bowels. Notes no bowel movement in the last 2 days, also exam with decreased bowel sounds. Recommend prunes on a daily basis, continue to monitor, may be impacting urinary function  HTN (hypertension) Recent blood pressure range satisfactory, continue current medication. Recent lab satisfactory  Parkinson disease Patient with significant functional limitations due to Parkinson's. She requires Extensive assistance with bathing, dressing, grooming, Limited assistance with feeding and toileting. These needs are met with the assistance of private caregivers, 24/ 7. These caregivers allow patient to remain in Assisted Living level of care. Patient requires more cuing in general. Patient continues to participate in exercise activities and walks significant distance daily. These activities are accompanied and assisted by a caregiver. Caregiver reports more coughing with fluids in the last few weeks. Notices this especially with taking pills,  with morning liquids and in manging with mouthwash. May benefit from repeat ST evaluation  ? Thickened liquids?  Has neurology follow up 07/20/2013    Follow up: Return in about 3 months  (around 10/04/2013) for OV.    Mardene Celeste, NP-C [facility] [phone]  07/05/2013

## 2013-07-05 NOTE — Assessment & Plan Note (Addendum)
Patient with significant functional limitations due to Parkinson's. She requires Extensive assistance with bathing, dressing, grooming, Limited assistance with feeding and toileting. These needs are met with the assistance of private caregivers, 24/ 7. These caregivers allow patient to remain in Assisted Living level of care. Patient requires more cuing in general. Patient continues to participate in exercise activities and walks significant distance daily. These activities are accompanied and assisted by a caregiver. Caregiver reports more coughing with fluids in the last few weeks. Notices this especially with taking pills, with morning liquids and in manging with mouthwash. May benefit from repeat ST evaluation  ? Thickened liquids?  Has neurology follow up 07/20/2013

## 2013-07-20 ENCOUNTER — Encounter: Payer: Self-pay | Admitting: Nurse Practitioner

## 2013-07-20 ENCOUNTER — Ambulatory Visit (INDEPENDENT_AMBULATORY_CARE_PROVIDER_SITE_OTHER): Payer: Medicare Other | Admitting: Nurse Practitioner

## 2013-07-20 VITALS — BP 163/83 | HR 83 | Ht 60.0 in | Wt 143.0 lb

## 2013-07-20 DIAGNOSIS — G20 Parkinson's disease: Secondary | ICD-10-CM | POA: Diagnosis not present

## 2013-07-20 DIAGNOSIS — G20A1 Parkinson's disease without dyskinesia, without mention of fluctuations: Secondary | ICD-10-CM

## 2013-07-20 NOTE — Patient Instructions (Signed)
Vanessa Mcbride is doing very well, her tremor is well controlled and her movements are more fluid.  There are no changes needed in her treatment at this time.  She will come back for her scheduled visit with Dr. Rexene Alberts in August.  Please call with any concerns.

## 2013-07-20 NOTE — Progress Notes (Addendum)
PATIENT: Vanessa Mcbride DOB: 1924/04/03  REASON FOR VISIT: sooner follow up for Parkinson's HISTORY FROM: patient  HISTORY OF PRESENT ILLNESS: Update 04/30/5 (LL): Vanessa Mcbride returns today for a sooner revisit per her daughter's request for increased tremor.  Daughter is not present for today's visit.  Patient is accompanied by caregiver from [place] and a Friend.   They state that daugther wants more frequent follow up for her mother, at least every 3 months.  There is no worsening of symptoms per caregiver's report.  She is tolerating Sinemet well, with no visible tremor.  Caregiver states that movements are sometimes slow and fragmented but they are continually working with her and she continues to work hard to keep as active as possible.  Constipation is reportedly not a problem, with daily prune juice.  Patient is pleasant and endorses good mood most of the time, though she misses her husband.  She has not had any falls recently.  No new problems to report.  Previous HPI (SA):  Vanessa Mcbride is a very friendly 80 year old right-handed woman with an underlying medical history of hyperlipidemia, hypertension, osteoarthritis, insomnia, memory loss, anxiety and history of fall with fracture of her left arm in 2014, who presents for followup consultation of her right-sided predominant Parkinson's disease. She is accompanied by one of her caretakers from [place], [name], and another caretaker, Lesly Rubenstein, who works for her Daughter [name]) again today. I last saw her on 12/23/2012, and which time I increased her Sinemet to 4 times daily from tid, as I felt that she had worsened since her previous visit. She reported worse memory, poor sleep, and occasional muscle jerks. I continued her on 1-1/2 pills for the first dose and one pill each time for the last 3 doses.  Today, she reports, that the increase in C/L was helpful. She has fallen unfortunately, but thankfully did not hurt herself, but did have some bruising.  She takes melatonin for sleep and [name] has noted during night shift that Vanessa Mcbride has some apneas and she snores. She never had a sleep study. She had a rough time last week. She had some vomiting. She may have had a stomach bug. She is feeling well today. She denies any pain today.  I saw her on 09/21/2012, at which time I increased her morning dose of Sinemet to 1-1/2 pills and otherwise she was to continue her midday dose at one pill and her afternoon dose at one pill. Prior to that I saw her on 06/09/2012. I did not make any medication changes in 3/14. Her primary care physician encouraged her to use her walker or cane but she does not like to use them. She holds onto her caretaker.   REVIEW OF SYSTEMS: Full 14 system review of systems performed and notable only for:  Tremors, activity change  ALLERGIES: Allergies  Allergen Reactions  . Iodine Anaphylaxis  . Iohexol      Desc: pt has had contrast in the past (years ago) and it causea anaphylaxis- per pt's daughter.  stephanie davis,rtrct, Onset Date: [id number]     HOME MEDICATIONS: Outpatient Prescriptions Prior to Visit  Medication Sig Dispense Refill  . acetaminophen (TYLENOL) 325 MG tablet Take 650 mg by mouth. Take 2 three times daily as needed      . amLODipine (NORVASC) 2.5 MG tablet Take 2.5 mg by mouth daily.      Marland Kitchen antiseptic oral rinse (BIOTENE) LIQD 15 mLs by Mouth Rinse route. Use 4 times  daily before meals and bedtime      . aspirin 81 MG tablet Take 81 mg by mouth daily.      . carbidopa-levodopa (SINEMET IR) 25-100 MG per tablet Take 1-1.5 tablets by mouth. Take 1 1/2 tablet at 7am, 4pm; take 1 tablet at 11am, 7pm      . diclofenac sodium (VOLTAREN) 1 % GEL Apply 2 g topically. Use 2 grams to each shoulder three times daily to reduce pain      . escitalopram (LEXAPRO) 10 MG tablet Take 10 mg by mouth daily.      Marland Kitchen HYDROcodone-acetaminophen (NORCO/VICODIN) 5-325 MG per tablet Take 1 tablet by mouth every 8 (eight) hours as  needed for moderate pain.      Marland Kitchen loperamide (IMODIUM) 2 MG capsule Take 2 mg by mouth every 6 (six) hours as needed for diarrhea or loose stools.      . Melatonin 10 MG TABS Take 1 tablet by mouth at bedtime as needed. sleep      . metoprolol succinate (TOPROL-XL) 25 MG 24 hr tablet Take 12.5 mg by mouth at bedtime.      . Multiple Vitamin (MULTIVITAMIN WITH MINERALS) TABS Take 1 tablet by mouth at bedtime.       No facility-administered medications prior to visit.     PHYSICAL EXAM  Filed Vitals:   07/20/13 1043  BP: 163/83  Pulse: 83  Height: 5' (1.524 m)  Weight: 143 lb (64.864 kg)   Body mass index is 27.93 kg/(m^2).  Generalized: Well developed, in no acute distress  Head: normocephalic and atraumatic. Oropharynx benign  Neck: Supple, no carotid bruits  Cardiac: Regular rate rhythm, no murmur  Musculoskeletal: No deformity   Neurological examination  HEENT exam: Normocephalic, atraumatic, moderate facial masking is noted. She has no lip, neck or jaw tremor. Neck tone is moderately elevated. She has normal range of motion. Hearing is intact. Speech is otherwise clear. Oropharynx exam reveals no abnormalities. Pupils are reactive to light and extraocular tracking is fair with moderate saccadic breakdown of smooth pursuit.  Neurologically: Mental status: The patient is awake, alert and oriented to self, and circumstance. She cannot tell the date, day of the week, month or year, always off by one. Cranial nerves are as described under HEENT exam. Motor exam: She has a thin bulk. Strength is normal for age. Her left arm mobility is better. Tone is increased on the right side with moderate cogwheeling noted in the right upper extremity and a little milder on the L. She has an intermittent resting tremor in the R>L UEs. She has significant slowness, worse than before. She has severe impairment of finger taps on the R and moderate to severe impairment on the L. Same with hand movements. She  has severe impairment of foot taps on the R and moderately so on the L. She is able to stand up from the seated position with assistance. She walks with assistance. She has a moderately stooped posture. She walks with a mild shuffle and needs assistance by holding onto [place]. She does have a decreased stride length and almost absent arm swing bilaterally. She turns in 3 steps. Reflexes are 1+ in the upper extremity on the right and 2+ in the lower extremities. Sensory exam is intact to light touch throughout.   ASSESSMENT AND PLAN In summary, Vanessa Mcbride is a very pleasant 80 year old lady with a Parkinson's disease, right sided predominant with overall moderate to severe findings and Sx and exam  are stable since her last visit. I have asked her to continue alternate one and half pills with one pill every day, therefore, she will take 1-1/2 pills at 7 AM, 1 pill at 11 AM, 1-1/2 pills at 4 PM, and one pill at 7 PM.  No changes were made today.  She is advised to use assistance for walking at all times.They should keep their follow up appointment with Dr. Rexene Alberts in August and call sooner if the need arises. They were in agreement.  [name], MSN, NP-C 07/20/2013, 11:06 AM [facility] [address] [place], [place] [address] [phone]  Note: This document was prepared with digital dictation and possible smart phrase technology. Any transcriptional errors that result from this process are unintentional.  I reviewed the above note and documentation by the Nurse Practitioner and agree with the history, physical exam, assessment and plan as outlined above.'

## 2013-07-21 ENCOUNTER — Telehealth: Payer: Self-pay | Admitting: Neurology

## 2013-07-21 NOTE — Telephone Encounter (Signed)
Pt's caretaker called states she was in the room with pt while speaking with NP/LL, states she advised pt to not over 1 dessert. Per [facility]isted [facility] this will need to be in writing and forward over to pt. When you call and speak with nurse she can tell who to send this letter to. Thanks

## 2013-07-24 NOTE — Telephone Encounter (Signed)
Spoke with patient's nurse and she said that message came from her sitter and to please disregard, will call back if needed

## 2013-09-13 ENCOUNTER — Ambulatory Visit: Payer: Medicare Other | Admitting: Neurology

## 2013-10-02 ENCOUNTER — Non-Acute Institutional Stay: Payer: Medicare Other | Admitting: Internal Medicine

## 2013-10-02 ENCOUNTER — Encounter: Payer: Self-pay | Admitting: Internal Medicine

## 2013-10-02 VITALS — BP 110/60 | HR 76 | Wt 148.0 lb

## 2013-10-02 DIAGNOSIS — I10 Essential (primary) hypertension: Secondary | ICD-10-CM

## 2013-10-02 DIAGNOSIS — M25559 Pain in unspecified hip: Secondary | ICD-10-CM

## 2013-10-02 DIAGNOSIS — G20 Parkinson's disease: Secondary | ICD-10-CM

## 2013-10-02 DIAGNOSIS — M25551 Pain in right hip: Secondary | ICD-10-CM

## 2013-10-02 DIAGNOSIS — F0280 Dementia in other diseases classified elsewhere without behavioral disturbance: Secondary | ICD-10-CM

## 2013-10-02 DIAGNOSIS — E669 Obesity, unspecified: Secondary | ICD-10-CM

## 2013-10-02 DIAGNOSIS — R42 Dizziness and giddiness: Secondary | ICD-10-CM

## 2013-10-02 DIAGNOSIS — R269 Unspecified abnormalities of gait and mobility: Secondary | ICD-10-CM

## 2013-10-02 DIAGNOSIS — G20A1 Parkinson's disease without dyskinesia, without mention of fluctuations: Secondary | ICD-10-CM

## 2013-10-02 DIAGNOSIS — I69993 Ataxia following unspecified cerebrovascular disease: Secondary | ICD-10-CM | POA: Diagnosis not present

## 2013-10-02 DIAGNOSIS — M1990 Unspecified osteoarthritis, unspecified site: Secondary | ICD-10-CM

## 2013-10-02 DIAGNOSIS — R279 Unspecified lack of coordination: Secondary | ICD-10-CM | POA: Diagnosis not present

## 2013-10-02 DIAGNOSIS — R471 Dysarthria and anarthria: Secondary | ICD-10-CM | POA: Diagnosis not present

## 2013-10-02 DIAGNOSIS — M6281 Muscle weakness (generalized): Secondary | ICD-10-CM | POA: Diagnosis not present

## 2013-10-02 DIAGNOSIS — R1312 Dysphagia, oropharyngeal phase: Secondary | ICD-10-CM | POA: Diagnosis not present

## 2013-10-02 DIAGNOSIS — G4700 Insomnia, unspecified: Secondary | ICD-10-CM

## 2013-10-02 MED ORDER — SUVOREXANT 10 MG PO TABS: 10.0000 mg | ORAL_TABLET | Freq: Every day | ORAL | Status: DC

## 2013-10-02 NOTE — Progress Notes (Signed)
Patient ID: Vanessa Mcbride, female   DOB: [birth year], 80 y.o.   MRN: [id number]    Location:  [facility] (12)    Allergies  Allergen Reactions  . Iodine Anaphylaxis  . Iohexol      Desc: pt has had contrast in the past (years ago) and it causea anaphylaxis- per pt's daughter.  stephanie davis,rtrct, Onset Date: [id number]     Chief Complaint  Patient presents with  . Medical Management of Chronic Issues    blood pressure, Parkinson, dementia. Here with Care Giver and friend of daughter    HPI:  Essential hypertension: now running on the low side  Dementia in conditions classified elsewhere without behavioral disturbance(294.10): unchanged  Parkinson disease: worsening with increased tremor and rigidity. Much less stable on standing and at risk for falls.  Abnormality of gait: unstable  Osteoarthrosis, unspecified whether generalized or localized, unspecified site; denies pain today  Hip pain, right: resolved  Insomnia, unspecified: no help from melatonin  Obesity, unspecified: gained 30# in the last 2years  Dizzy: abrupt episode lasting over an hour per her daughter when standing a couple of weeks ago. Lesser episodes on rising at other times.      Medications: Patient's Medications  New Prescriptions   No medications on file  Previous Medications   ACETAMINOPHEN (TYLENOL) 325 MG TABLET    Take 650 mg by mouth. Take 2 three times daily as needed   AMLODIPINE (NORVASC) 2.5 MG TABLET    Take 2.5 mg by mouth daily.   ANTISEPTIC ORAL RINSE (BIOTENE) LIQD    15 mLs by Mouth Rinse route. Use 4 times daily before meals and bedtime   ASPIRIN 81 MG TABLET    Take 81 mg by mouth daily.   CARBIDOPA-LEVODOPA (SINEMET IR) 25-100 MG PER TABLET    Take 1-1.5 tablets by mouth. Take 1 1/2 tablet at 7am, 4pm; take 1 tablet at 11am, 7pm   DICLOFENAC SODIUM (VOLTAREN) 1 % GEL    Apply 2 g topically. Use 2 grams to each shoulder three times  daily to reduce pain   ESCITALOPRAM (LEXAPRO) 10 MG TABLET    Take 10 mg by mouth daily.   LOPERAMIDE (IMODIUM) 2 MG CAPSULE    Take 2 mg by mouth every 6 (six) hours as needed for diarrhea or loose stools.   MELATONIN 10 MG TABS    Take 1 tablet by mouth at bedtime as needed. sleep   METOPROLOL SUCCINATE (TOPROL-XL) 25 MG 24 HR TABLET    Take 12.5 mg by mouth at bedtime.   MULTIPLE VITAMIN (MULTIVITAMIN WITH MINERALS) TABS    Take 1 tablet by mouth at bedtime.  Modified Medications   No medications on file  Discontinued Medications   HYDROCODONE-ACETAMINOPHEN (NORCO/VICODIN) 5-325 MG PER TABLET    Take 1 tablet by mouth every 8 (eight) hours as needed for moderate pain.     Review of Systems  Constitutional: Negative for fever, chills, diaphoresis, activity change, appetite change and unexpected weight change.  HENT: Negative.   Eyes: Negative.   Respiratory: Negative.   Cardiovascular: Negative.   Gastrointestinal: Negative.   Endocrine: Negative.   Genitourinary: Negative.   Musculoskeletal:       Hx of fracture of the left radius and of the right humerus.  Skin:       Areas of dryness and redness on the superior aspect of both shoulders. Red cheeks after hot drinks. Lipoma of th left  supraclavicular area.  Neurological: Positive for tremors. Negative for seizures, syncope, facial asymmetry, speech difficulty, weakness, numbness and headaches.       Loss of memory. So far this appears to be reasonably mild. Patient denies focal weakness.  Hematological: Negative.   Psychiatric/Behavioral: Positive for sleep disturbance (Using melatonin for rest sometimes).    Filed Vitals:   10/02/13 1412  BP: 110/60  Pulse: 76  Weight: 148 lb (67.132 kg)   Body mass index is 28.9 kg/(m^2).  Physical Exam  Constitutional:  Frail elderly female in no acute distress.  HENT:  Head: Normocephalic and atraumatic.  Right Ear: External ear normal.  Left Ear: External ear normal.  Nose:  Nose normal.  Eyes: Conjunctivae and EOM are normal. Pupils are equal, round, and reactive to light.  Neck: Normal range of motion. Neck supple. No JVD present. No tracheal deviation present. No thyromegaly present.  Cardiovascular: Normal rate, regular rhythm, normal heart sounds and intact distal pulses.  Exam reveals no gallop and no friction rub.   No murmur heard. Pulmonary/Chest: Effort normal and breath sounds normal. No respiratory distress. She has no wheezes. She has no rales. She exhibits no tenderness.  Abdominal: Soft. Bowel sounds are normal. She exhibits no distension and no mass. There is no tenderness.  Musculoskeletal:  Tender in the mid upper half of the right humerus. Tender at the left wrist. No specific swelling or erythema at either of these areas. Gait appears to be slightly unstable. There is some difficulty with raising the right arm at the shoulder.  Lymphadenopathy:    She has no cervical adenopathy.  Neurological:  Mild memory loss. Cranial nerves intact. She does have a tremor when arms are extended. She has Parkinson's disease.  Skin: Skin is warm and dry. No pallor.  Tops of shoulders are slightly red and scaling. Dry skin. Lipoma of the left supraclavicular area.  Psychiatric: She has a normal mood and affect. Her behavior is normal. Thought content normal.     Labs reviewed: Nursing Home on 07/05/2013  Component Date Value Ref Range Status  . BUN 03/28/2013 8  4 - 21 mg/dL Final  . Cholesterol 03/28/2013 138  0 - 200 mg/dL Final  . HDL 03/28/2013 76* 35 - 70 mg/dL Final      Assessment/Plan 1. Essential hypertension Stop amlodipine  2. Dementia in conditions classified elsewhere without behavioral disturbance(294.10) unchanged  3. Parkinson disease Progressing with both tremor and rigidity  4. Abnormality of gait Very unstable on standing. Needs assistance of at least 1 when standing to stabilize her  5. Osteoarthrosis, unspecified whether  generalized or localized, unspecified site Stable and without pain at this time.  6. Hip pain, right resolved  7. Insomnia, unspecified Try Belsomra. Started at 10mg  hs.  8. Obesity, unspecified Try weight reduction diet  9. Dizzy Stop amlodipine   15 min phone call with her daughter, Vanessa Mcbride after the visit.

## 2013-10-04 DIAGNOSIS — R279 Unspecified lack of coordination: Secondary | ICD-10-CM | POA: Diagnosis not present

## 2013-10-04 DIAGNOSIS — R471 Dysarthria and anarthria: Secondary | ICD-10-CM | POA: Diagnosis not present

## 2013-10-04 DIAGNOSIS — I69993 Ataxia following unspecified cerebrovascular disease: Secondary | ICD-10-CM | POA: Diagnosis not present

## 2013-10-04 DIAGNOSIS — G20 Parkinson's disease: Secondary | ICD-10-CM | POA: Diagnosis not present

## 2013-10-04 DIAGNOSIS — M6281 Muscle weakness (generalized): Secondary | ICD-10-CM | POA: Diagnosis not present

## 2013-10-04 DIAGNOSIS — R1312 Dysphagia, oropharyngeal phase: Secondary | ICD-10-CM | POA: Diagnosis not present

## 2013-10-05 DIAGNOSIS — I69993 Ataxia following unspecified cerebrovascular disease: Secondary | ICD-10-CM | POA: Diagnosis not present

## 2013-10-05 DIAGNOSIS — G20 Parkinson's disease: Secondary | ICD-10-CM | POA: Diagnosis not present

## 2013-10-05 DIAGNOSIS — M6281 Muscle weakness (generalized): Secondary | ICD-10-CM | POA: Diagnosis not present

## 2013-10-05 DIAGNOSIS — R1312 Dysphagia, oropharyngeal phase: Secondary | ICD-10-CM | POA: Diagnosis not present

## 2013-10-05 DIAGNOSIS — R279 Unspecified lack of coordination: Secondary | ICD-10-CM | POA: Diagnosis not present

## 2013-10-05 DIAGNOSIS — R471 Dysarthria and anarthria: Secondary | ICD-10-CM | POA: Diagnosis not present

## 2013-10-09 DIAGNOSIS — M6281 Muscle weakness (generalized): Secondary | ICD-10-CM | POA: Diagnosis not present

## 2013-10-09 DIAGNOSIS — R1312 Dysphagia, oropharyngeal phase: Secondary | ICD-10-CM | POA: Diagnosis not present

## 2013-10-09 DIAGNOSIS — R279 Unspecified lack of coordination: Secondary | ICD-10-CM | POA: Diagnosis not present

## 2013-10-09 DIAGNOSIS — I69993 Ataxia following unspecified cerebrovascular disease: Secondary | ICD-10-CM | POA: Diagnosis not present

## 2013-10-09 DIAGNOSIS — R471 Dysarthria and anarthria: Secondary | ICD-10-CM | POA: Diagnosis not present

## 2013-10-09 DIAGNOSIS — G20 Parkinson's disease: Secondary | ICD-10-CM | POA: Diagnosis not present

## 2013-10-11 DIAGNOSIS — R279 Unspecified lack of coordination: Secondary | ICD-10-CM | POA: Diagnosis not present

## 2013-10-11 DIAGNOSIS — R1312 Dysphagia, oropharyngeal phase: Secondary | ICD-10-CM | POA: Diagnosis not present

## 2013-10-11 DIAGNOSIS — M6281 Muscle weakness (generalized): Secondary | ICD-10-CM | POA: Diagnosis not present

## 2013-10-11 DIAGNOSIS — G20 Parkinson's disease: Secondary | ICD-10-CM | POA: Diagnosis not present

## 2013-10-11 DIAGNOSIS — R471 Dysarthria and anarthria: Secondary | ICD-10-CM | POA: Diagnosis not present

## 2013-10-11 DIAGNOSIS — I69993 Ataxia following unspecified cerebrovascular disease: Secondary | ICD-10-CM | POA: Diagnosis not present

## 2013-10-13 DIAGNOSIS — I69993 Ataxia following unspecified cerebrovascular disease: Secondary | ICD-10-CM | POA: Diagnosis not present

## 2013-10-13 DIAGNOSIS — R1312 Dysphagia, oropharyngeal phase: Secondary | ICD-10-CM | POA: Diagnosis not present

## 2013-10-13 DIAGNOSIS — G20 Parkinson's disease: Secondary | ICD-10-CM | POA: Diagnosis not present

## 2013-10-13 DIAGNOSIS — M6281 Muscle weakness (generalized): Secondary | ICD-10-CM | POA: Diagnosis not present

## 2013-10-13 DIAGNOSIS — R279 Unspecified lack of coordination: Secondary | ICD-10-CM | POA: Diagnosis not present

## 2013-10-13 DIAGNOSIS — R471 Dysarthria and anarthria: Secondary | ICD-10-CM | POA: Diagnosis not present

## 2013-10-16 DIAGNOSIS — R279 Unspecified lack of coordination: Secondary | ICD-10-CM | POA: Diagnosis not present

## 2013-10-16 DIAGNOSIS — R1312 Dysphagia, oropharyngeal phase: Secondary | ICD-10-CM | POA: Diagnosis not present

## 2013-10-16 DIAGNOSIS — G20 Parkinson's disease: Secondary | ICD-10-CM | POA: Diagnosis not present

## 2013-10-16 DIAGNOSIS — R471 Dysarthria and anarthria: Secondary | ICD-10-CM | POA: Diagnosis not present

## 2013-10-16 DIAGNOSIS — I69993 Ataxia following unspecified cerebrovascular disease: Secondary | ICD-10-CM | POA: Diagnosis not present

## 2013-10-16 DIAGNOSIS — M6281 Muscle weakness (generalized): Secondary | ICD-10-CM | POA: Diagnosis not present

## 2013-10-17 DIAGNOSIS — M6281 Muscle weakness (generalized): Secondary | ICD-10-CM | POA: Diagnosis not present

## 2013-10-17 DIAGNOSIS — R279 Unspecified lack of coordination: Secondary | ICD-10-CM | POA: Diagnosis not present

## 2013-10-17 DIAGNOSIS — I69993 Ataxia following unspecified cerebrovascular disease: Secondary | ICD-10-CM | POA: Diagnosis not present

## 2013-10-17 DIAGNOSIS — G20 Parkinson's disease: Secondary | ICD-10-CM | POA: Diagnosis not present

## 2013-10-17 DIAGNOSIS — R471 Dysarthria and anarthria: Secondary | ICD-10-CM | POA: Diagnosis not present

## 2013-10-17 DIAGNOSIS — R1312 Dysphagia, oropharyngeal phase: Secondary | ICD-10-CM | POA: Diagnosis not present

## 2013-10-18 DIAGNOSIS — R279 Unspecified lack of coordination: Secondary | ICD-10-CM | POA: Diagnosis not present

## 2013-10-18 DIAGNOSIS — R1312 Dysphagia, oropharyngeal phase: Secondary | ICD-10-CM | POA: Diagnosis not present

## 2013-10-18 DIAGNOSIS — I69993 Ataxia following unspecified cerebrovascular disease: Secondary | ICD-10-CM | POA: Diagnosis not present

## 2013-10-18 DIAGNOSIS — M6281 Muscle weakness (generalized): Secondary | ICD-10-CM | POA: Diagnosis not present

## 2013-10-18 DIAGNOSIS — R471 Dysarthria and anarthria: Secondary | ICD-10-CM | POA: Diagnosis not present

## 2013-10-18 DIAGNOSIS — G20 Parkinson's disease: Secondary | ICD-10-CM | POA: Diagnosis not present

## 2013-10-19 DIAGNOSIS — R279 Unspecified lack of coordination: Secondary | ICD-10-CM | POA: Diagnosis not present

## 2013-10-19 DIAGNOSIS — M6281 Muscle weakness (generalized): Secondary | ICD-10-CM | POA: Diagnosis not present

## 2013-10-19 DIAGNOSIS — G20 Parkinson's disease: Secondary | ICD-10-CM | POA: Diagnosis not present

## 2013-10-19 DIAGNOSIS — I69993 Ataxia following unspecified cerebrovascular disease: Secondary | ICD-10-CM | POA: Diagnosis not present

## 2013-10-19 DIAGNOSIS — R1312 Dysphagia, oropharyngeal phase: Secondary | ICD-10-CM | POA: Diagnosis not present

## 2013-10-19 DIAGNOSIS — R471 Dysarthria and anarthria: Secondary | ICD-10-CM | POA: Diagnosis not present

## 2013-10-20 DIAGNOSIS — R279 Unspecified lack of coordination: Secondary | ICD-10-CM | POA: Diagnosis not present

## 2013-10-20 DIAGNOSIS — R471 Dysarthria and anarthria: Secondary | ICD-10-CM | POA: Diagnosis not present

## 2013-10-20 DIAGNOSIS — G20 Parkinson's disease: Secondary | ICD-10-CM | POA: Diagnosis not present

## 2013-10-20 DIAGNOSIS — I69993 Ataxia following unspecified cerebrovascular disease: Secondary | ICD-10-CM | POA: Diagnosis not present

## 2013-10-20 DIAGNOSIS — R1312 Dysphagia, oropharyngeal phase: Secondary | ICD-10-CM | POA: Diagnosis not present

## 2013-10-20 DIAGNOSIS — M6281 Muscle weakness (generalized): Secondary | ICD-10-CM | POA: Diagnosis not present

## 2013-10-24 DIAGNOSIS — I69993 Ataxia following unspecified cerebrovascular disease: Secondary | ICD-10-CM | POA: Diagnosis not present

## 2013-10-24 DIAGNOSIS — M25529 Pain in unspecified elbow: Secondary | ICD-10-CM | POA: Diagnosis not present

## 2013-10-24 DIAGNOSIS — R269 Unspecified abnormalities of gait and mobility: Secondary | ICD-10-CM | POA: Diagnosis not present

## 2013-10-24 DIAGNOSIS — R471 Dysarthria and anarthria: Secondary | ICD-10-CM | POA: Diagnosis not present

## 2013-10-24 DIAGNOSIS — G20 Parkinson's disease: Secondary | ICD-10-CM | POA: Diagnosis not present

## 2013-10-24 DIAGNOSIS — M6281 Muscle weakness (generalized): Secondary | ICD-10-CM | POA: Diagnosis not present

## 2013-10-24 DIAGNOSIS — R279 Unspecified lack of coordination: Secondary | ICD-10-CM | POA: Diagnosis not present

## 2013-10-24 DIAGNOSIS — R1312 Dysphagia, oropharyngeal phase: Secondary | ICD-10-CM | POA: Diagnosis not present

## 2013-11-07 ENCOUNTER — Ambulatory Visit (INDEPENDENT_AMBULATORY_CARE_PROVIDER_SITE_OTHER): Payer: Medicare Other | Admitting: Neurology

## 2013-11-07 ENCOUNTER — Encounter: Payer: Self-pay | Admitting: Neurology

## 2013-11-07 VITALS — BP 151/84 | HR 59 | Temp 97.5°F | Ht 60.0 in | Wt 151.0 lb

## 2013-11-07 DIAGNOSIS — G20 Parkinson's disease: Secondary | ICD-10-CM | POA: Diagnosis not present

## 2013-11-07 DIAGNOSIS — G479 Sleep disorder, unspecified: Secondary | ICD-10-CM

## 2013-11-07 DIAGNOSIS — R4181 Age-related cognitive decline: Secondary | ICD-10-CM

## 2013-11-07 DIAGNOSIS — G20A1 Parkinson's disease without dyskinesia, without mention of fluctuations: Secondary | ICD-10-CM

## 2013-11-07 DIAGNOSIS — R54 Age-related physical debility: Secondary | ICD-10-CM

## 2013-11-07 MED ORDER — CARBIDOPA-LEVODOPA 25-100 MG PO TABS: ORAL_TABLET | ORAL | Status: DC

## 2013-11-07 MED ORDER — CARBIDOPA-LEVODOPA ER 50-200 MG PO TBCR: 1.0000 | EXTENDED_RELEASE_TABLET | Freq: Every day | ORAL | Status: DC

## 2013-11-07 NOTE — Patient Instructions (Addendum)
Let's do a small increase in your sinemet: 1 1/2 pills 4 times a day, namely at 6 AM, 10 AM, 2 PM and 6 PM and we will also introduce a long acting Sinemet CR 50/200 mg one pill at bedtime. Common side effects reported are: Nausea, vomiting, sedation, confusion, lightheadedness. Rare side effects include hallucinations, severe nausea or vomiting, diarrhea and significant drop in blood pressure especially when going from lying to standing or from sitting to standing.   Follow up in 2 months with Jeani Hawking, NP and about 4 months with me.

## 2013-11-07 NOTE — Progress Notes (Signed)
Subjective:    Patient ID: Vanessa Mcbride is a 80 y.o. female.  HPI    Interim history:   Vanessa Mcbride is a very friendly 80 year old right-handed woman with an underlying medical history of hyperlipidemia, hypertension, osteoarthritis, insomnia, memory loss, anxiety and history of fall with fracture of her left arm in 2014, who presents for followup consultation of her right-sided predominant Parkinson's disease. She is accompanied by one of her caretakers from [place], Kennyth Mcbride, and her Daughter Vanessa Mcbride) today, whom I meet for the first time today. I last saw her on 04/25/2013, at which time I felt that her symptoms were worse with respect to her walking and her tremors. I suggested a slight increase in her Sinemet to alternating 1-1/2 pills with one pill for a total of 4 doses and a total of 5 pills. Her caretakers reported that she snored and had had some apneic pauses in her breathing. I suggested a sleep study but they suggested that he check with the patient's daughter and call back with regards to doing a sleep study. In the interim, she was seen by our nurse practitioner, Ms. Lam on 07/20/2013 at which time she was fairly stable and the medications were kept the same.  Today, her daughter worries about patient's weight gain and that Vanessa Mcbride. She has had difficulty maintaining sleep and the melatonin was stopped and amlodipine was stopped recently. Her primary care physician prescribed Belsomra, but her daughter states that she was not comfortable with her mother taking a sleeping pill and it was not started. She gets her first dose of levodopa around 6:30. By the time [name] sees her she has had the medication for about an hour but the patient is still quite stiff first thing in the morning. She is physically more easily exhausted. She has had more trouble walking and is more stiff and more slow. She has to use the bathroom every 2-3 hours each night. She has a 24 hour around caregiver at [employer]  Her daughter states that she resigned from her job last week to the closer to her mother and be with her mother more often. The patient has a son in [place] who is going to come to visit next month for her birthday. She has 4 grandchildren.  I saw her on 12/23/2012, and which time I increased her Sinemet to 4 times daily from tid, as I felt that she had worsened since her previous visit. She reported worse memory, poor sleep, and occasional muscle jerks. I continued her on 1-1/2 pills for the first dose and one pill each time for the last 3 doses.  I saw her on 09/21/2012, at which time I increased her morning dose of Sinemet to 1-1/2 pills and otherwise she was to continue her midday dose at one pill and her afternoon dose at one pill. Prior to that I saw her on 06/09/2012. I did not make any medication changes in 3/14. Her primary care physician encouraged her to use her walker or cane but she does not like to use them. She holds onto her caretaker.   Her Past Medical History Is Significant For: Past Medical History  Diagnosis Date  . Parkinson's disease   . Stroke   . Hypertension   . Arthritis     osteoarthritis  . Scoliosis   . Memory loss 06/09/2012  . Aortic valve disorders 04/11/2012  . Dementia in conditions classified elsewhere without behavioral disturbance 04/07/2012  . Hyperlipidemia   . Osteoarthrosis,  unspecified whether generalized or localized, unspecified site   . Insomnia, unspecified 03/20/2012  . Fracture closed, humerus 03/20/2012  . Other closed fractures of distal end of radius (alone) 03/20/2012  . Transient ischemic attack (TIA), and cerebral infarction without residual deficits(V12.54)   . Unspecified constipation   . Debility, unspecified 03/18/2012  . Anxiety   . Generalized anxiety disorder   . Urinary incontinence 07/05/2013    Re: PD    Her Past Surgical History Is Significant For: Past Surgical History  Procedure Laterality Date  . Breast surgery       benign lumpectomy  . Cholecystectomy      Her Family History Is Significant For: Family History  Problem Relation Age of Onset  . Heart disease Son     CAD  . Diabetes Mother   . Diabetes Sister   . Hypertension Sister     Her Social History Is Significant For: History   Social History  . Marital Status: Widowed    Spouse Name: N/A    Number of Children: 2  . Years of Education: college   Occupational History  . retired Armed forces operational officer    Social History Main Topics  . Smoking status: Never Smoker   . Smokeless tobacco: Never Used  . Alcohol Use: No  . Drug Use: No  . Sexual Activity: No   Other Topics Concern  . None   Social History Narrative   Widowed 02/2012 (after 74 yr marriage). [place], understands English fairly well, speaks English poorly. Occupation: Retired Armed forces operational officer. Moved form [place] to [place] 03/2012,     Lives at [employer],  [place] section. Has 24 hour caregivers.   Advanced Directives:  DNR, MOST (05/2012)        Her Allergies Are:  Allergies  Allergen Reactions  . Iodine Anaphylaxis  . Iohexol      Desc: pt has had contrast in the past (years ago) and it causea anaphylaxis- per pt's daughter.  stephanie davis,rtrct, Onset Date: [id number]   :   Her Current Medications Are:  Outpatient Encounter Prescriptions as of 11/07/2013  Medication Sig  . acetaminophen (TYLENOL) 325 MG tablet Take 650 mg by mouth. Take 2 three times daily as needed  . antiseptic oral rinse (BIOTENE) LIQD 15 mLs by Mouth Rinse route. Use 4 times daily before meals and bedtime  . aspirin 81 MG tablet Take 81 mg by mouth daily.  . carbidopa-levodopa (SINEMET IR) 25-100 MG per tablet Take 1-1.5 tablets by mouth. Take 1 1/2 tablet at 7am, 4pm; take 1 tablet at 11am, 7pm  . diclofenac sodium (VOLTAREN) 1 % GEL Apply 2 g topically. Use 2 grams to each shoulder three times daily to reduce pain  . escitalopram (LEXAPRO) 10 MG tablet Take 10 mg by  mouth daily.  Marland Kitchen loperamide (IMODIUM) 2 MG capsule Take 2 mg by mouth every 6 (six) hours as needed for diarrhea or loose stools.  . Melatonin 10 MG TABS Take 10 mg by mouth daily.  . metoprolol succinate (TOPROL-XL) 25 MG 24 hr tablet Take 12.5 mg by mouth at bedtime.  . Suvorexant (BELSOMRA) 10 MG TABS Take 10 mg by mouth daily at 8 pm. One nightly for insomnia  . tetrahydrozoline-zinc (VISINE-AC) 0.05-0.25 % ophthalmic solution 2 drops 4 (four) times daily as needed.  . [DISCONTINUED] amLODipine (NORVASC) 2.5 MG tablet Take 2.5 mg by mouth daily.  . [DISCONTINUED] Multiple Vitamin (MULTIVITAMIN WITH MINERALS) TABS Take 1 tablet by mouth at bedtime.  :  Review of Systems:  Out of a complete 14 point review of systems, all are reviewed and negative with the exception of these symptoms as listed below:  Review of Systems  Constitutional: Positive for activity change.  HENT: Positive for drooling.   Genitourinary:       Frequency of urination at night  Neurological: Positive for tremors, speech difficulty and weakness.       Insomnia,facial drooping    Objective:  Neurologic Exam  Physical Exam Physical Examination:   Filed Vitals:   11/07/13 1433  BP: 151/84  Pulse: 59  Temp: 97.5 F (36.4 C)    General Examination: The patient is a very pleasant 80 y.o. female in no acute distress. She appears frail and has gained weight.    HEENT exam: Normocephalic, atraumatic, moderate facial masking is noted and she keeps her mouth open more. She has no lip, neck or jaw tremor. Neck tone is moderately elevated. She has normal range of motion. Hearing is intact. Speech is otherwise clear. Oropharynx exam reveals no abnormalities. Pupils are reactive to light and extraocular tracking is fair with moderate saccadic breakdown of smooth pursuit. Chest is clear to auscultation with no wheezing or rhonchi. Heart sounds are normal without murmurs, rubs or gallops. Abdomen is soft, nontender. Bowel  sounds are appreciated. She has no pitting edema today. Neurologically: Mental status: The patient is awake, alert and oriented to self, and circumstance. She is able to answer questions and there is moderate bradyphrenia. Cranial nerves are as described under HEENT exam. Motor exam: She a global strength of 4+/5. Her left arm mobility is better. Tone is increased from last time and bradykinesia is worse. There is moderate cogwheeling noted in the right upper extremity and a little milder on the L. She has no tremor today. She has significant slowness, worse than before. She has severe impairment of finger taps on the R and moderate to severe impairment on the L. Same with hand movements. She has severe impairment of foot taps on the R and moderately so on the L. She is able to stand up from the seated position with maximum assistance. She walks very little with assistance. She has a moderately stooped posture. She walks with start hesitation and mild freezing and needs assistance by holding onto [place].  She turns with much more difficulty than before. Sensory exam is intact to light touch throughout.   Assessment and plan:    In summary, Ms. Ennis is a very pleasant 80 year old lady with advanced Parkinson's disease, right sided predominant with overall moderate to severe findings and  continuous progression of her symptoms. She has gained weight but her daughter has worked with nutrition to improve her calorie intake. Part of her weight gain is also the lack of mobility. She has certainly progressed and her slowness, stiffness and her gait dysfunction. Her balance is worse as well. I had a long chat with the patient and particularly her daughter who I meet for the first time today regarding her advanced symptoms and limitations because of the age factor and concern for potential side effects with medications such as sleep aids. At this juncture, would like to see if we can step up the Sinemet just a little  bit more to see if we can help her with her mobility. Better mobility and less freezing and less stiffness would help her walk a little bit more with assistance. To that end I suggested we increase the Sinemet to 1-1/2 pills 4 times  a day, namely at 6, 10, 2 PM and 6 PM. Furthermore, for nighttime use I would like to suggest Sinemet CR 50/200 mg strength one pill at bedtime, between 8:30 and 9 PM. Side effects to look out for are lightheadedness, drop in blood pressure, nausea, hallucinations, dyskinesias. We mutually decided not to do a sleep study. Her daughter feels that the patient would not be able to tolerate a CPAP machine evening if she had sleep apnea. She will try to have patient sleep with the head of bed elevated a little bit. I think we should focus on improving her mobility at this time with cautious increase in Sinemet and adding a dose of Sinemet CR at night. The patient and her caretakers were in agreement. I would like for her to come back and see [place] in 2 months with my nurse practitioner, Sula Soda, and I would like to see her back in about 4 months after today's visit. They were in agreement. I answered all their questions today. I filled out her paperwork from her assisted living facility as well as provided them with written instructions.

## 2013-11-20 ENCOUNTER — Inpatient Hospital Stay (HOSPITAL_COMMUNITY)
Admission: EM | Admit: 2013-11-20 | Discharge: 2013-11-23 | DRG: 309 | Disposition: A | Discharge status: Skilled Nursing Facility | Payer: Medicare Other | Attending: Internal Medicine | Admitting: Internal Medicine

## 2013-11-20 ENCOUNTER — Emergency Department (HOSPITAL_COMMUNITY): Payer: Medicare Other

## 2013-11-20 ENCOUNTER — Encounter (HOSPITAL_COMMUNITY): Payer: Self-pay | Admitting: Emergency Medicine

## 2013-11-20 DIAGNOSIS — R5381 Other malaise: Secondary | ICD-10-CM | POA: Diagnosis present

## 2013-11-20 DIAGNOSIS — S22000A Wedge compression fracture of unspecified thoracic vertebra, initial encounter for closed fracture: Secondary | ICD-10-CM | POA: Diagnosis present

## 2013-11-20 DIAGNOSIS — I10 Essential (primary) hypertension: Secondary | ICD-10-CM | POA: Diagnosis not present

## 2013-11-20 DIAGNOSIS — F411 Generalized anxiety disorder: Secondary | ICD-10-CM | POA: Diagnosis present

## 2013-11-20 DIAGNOSIS — S22079A Unspecified fracture of T9-T10 vertebra, initial encounter for closed fracture: Secondary | ICD-10-CM

## 2013-11-20 DIAGNOSIS — E876 Hypokalemia: Secondary | ICD-10-CM | POA: Diagnosis present

## 2013-11-20 DIAGNOSIS — Z66 Do not resuscitate: Secondary | ICD-10-CM | POA: Diagnosis present

## 2013-11-20 DIAGNOSIS — E785 Hyperlipidemia, unspecified: Secondary | ICD-10-CM | POA: Diagnosis present

## 2013-11-20 DIAGNOSIS — M8448XA Pathological fracture, other site, initial encounter for fracture: Secondary | ICD-10-CM | POA: Diagnosis present

## 2013-11-20 DIAGNOSIS — Z8673 Personal history of transient ischemic attack (TIA), and cerebral infarction without residual deficits: Secondary | ICD-10-CM | POA: Diagnosis not present

## 2013-11-20 DIAGNOSIS — G20 Parkinson's disease: Secondary | ICD-10-CM

## 2013-11-20 DIAGNOSIS — F0390 Unspecified dementia without behavioral disturbance: Secondary | ICD-10-CM | POA: Diagnosis present

## 2013-11-20 DIAGNOSIS — I359 Nonrheumatic aortic valve disorder, unspecified: Secondary | ICD-10-CM | POA: Diagnosis present

## 2013-11-20 DIAGNOSIS — M545 Low back pain, unspecified: Secondary | ICD-10-CM | POA: Diagnosis not present

## 2013-11-20 DIAGNOSIS — G20A1 Parkinson's disease without dyskinesia, without mention of fluctuations: Secondary | ICD-10-CM | POA: Diagnosis present

## 2013-11-20 DIAGNOSIS — J90 Pleural effusion, not elsewhere classified: Secondary | ICD-10-CM | POA: Diagnosis not present

## 2013-11-20 DIAGNOSIS — I455 Other specified heart block: Secondary | ICD-10-CM | POA: Diagnosis not present

## 2013-11-20 DIAGNOSIS — J9819 Other pulmonary collapse: Secondary | ICD-10-CM | POA: Diagnosis not present

## 2013-11-20 DIAGNOSIS — Z7982 Long term (current) use of aspirin: Secondary | ICD-10-CM

## 2013-11-20 DIAGNOSIS — S22009A Unspecified fracture of unspecified thoracic vertebra, initial encounter for closed fracture: Secondary | ICD-10-CM | POA: Diagnosis not present

## 2013-11-20 DIAGNOSIS — I4891 Unspecified atrial fibrillation: Secondary | ICD-10-CM | POA: Diagnosis present

## 2013-11-20 DIAGNOSIS — M1990 Unspecified osteoarthritis, unspecified site: Secondary | ICD-10-CM | POA: Diagnosis present

## 2013-11-20 DIAGNOSIS — R109 Unspecified abdominal pain: Secondary | ICD-10-CM | POA: Diagnosis not present

## 2013-11-20 DIAGNOSIS — K5730 Diverticulosis of large intestine without perforation or abscess without bleeding: Secondary | ICD-10-CM | POA: Diagnosis not present

## 2013-11-20 DIAGNOSIS — Z79899 Other long term (current) drug therapy: Secondary | ICD-10-CM

## 2013-11-20 DIAGNOSIS — R1084 Generalized abdominal pain: Secondary | ICD-10-CM

## 2013-11-20 DIAGNOSIS — M546 Pain in thoracic spine: Secondary | ICD-10-CM

## 2013-11-20 DIAGNOSIS — M549 Dorsalgia, unspecified: Secondary | ICD-10-CM | POA: Diagnosis not present

## 2013-11-20 DIAGNOSIS — M4120 Other idiopathic scoliosis, site unspecified: Secondary | ICD-10-CM | POA: Diagnosis present

## 2013-11-20 DIAGNOSIS — I517 Cardiomegaly: Secondary | ICD-10-CM | POA: Diagnosis not present

## 2013-11-20 DIAGNOSIS — K5900 Constipation, unspecified: Secondary | ICD-10-CM | POA: Diagnosis present

## 2013-11-20 DIAGNOSIS — I495 Sick sinus syndrome: Secondary | ICD-10-CM | POA: Diagnosis present

## 2013-11-20 LAB — CBC
HEMATOCRIT: 44.4 % (ref 36.0–46.0)
Hemoglobin: 14.9 g/dL (ref 12.0–15.0)
MCH: 31.8 pg (ref 26.0–34.0)
MCHC: 33.6 g/dL (ref 30.0–36.0)
MCV: 94.9 fL (ref 78.0–100.0)
Platelets: 210 10*3/uL (ref 150–400)
RBC: 4.68 MIL/uL (ref 3.87–5.11)
RDW: 12.3 % (ref 11.5–15.5)
WBC: 10.6 10*3/uL — ABNORMAL HIGH (ref 4.0–10.5)

## 2013-11-20 LAB — URINALYSIS, ROUTINE W REFLEX MICROSCOPIC
Bilirubin Urine: NEGATIVE
Glucose, UA: NEGATIVE mg/dL
HGB URINE DIPSTICK: NEGATIVE
Ketones, ur: NEGATIVE mg/dL
LEUKOCYTES UA: NEGATIVE
Nitrite: NEGATIVE
PH: 8 (ref 5.0–8.0)
Protein, ur: 30 mg/dL — AB
Specific Gravity, Urine: 1.014 (ref 1.005–1.030)
Urobilinogen, UA: 0.2 mg/dL (ref 0.0–1.0)

## 2013-11-20 LAB — BASIC METABOLIC PANEL
ANION GAP: 13 (ref 5–15)
BUN: 12 mg/dL (ref 6–23)
CO2: 29 mEq/L (ref 19–32)
Calcium: 9.6 mg/dL (ref 8.4–10.5)
Chloride: 94 mEq/L — ABNORMAL LOW (ref 96–112)
Creatinine, Ser: 0.46 mg/dL — ABNORMAL LOW (ref 0.50–1.10)
GFR calc non Af Amer: 88 mL/min — ABNORMAL LOW (ref >90)
Glucose, Bld: 138 mg/dL — ABNORMAL HIGH (ref 70–99)
POTASSIUM: 3.9 meq/L (ref 3.7–5.3)
Sodium: 136 mEq/L — ABNORMAL LOW (ref 137–147)

## 2013-11-20 LAB — URINE MICROSCOPIC-ADD ON

## 2013-11-20 MED ORDER — ONDANSETRON HCL 4 MG/2ML IJ SOLN
4.0000 mg | Freq: Once | INTRAMUSCULAR | Status: AC
  Administered 2013-11-20: 4 mg via INTRAVENOUS
  Filled 2013-11-20: qty 2

## 2013-11-20 MED ORDER — CARBIDOPA-LEVODOPA 25-100 MG PO TABS
1.5000 | ORAL_TABLET | Freq: Once | ORAL | Status: AC
  Administered 2013-11-20: 1.5 via ORAL
  Filled 2013-11-20: qty 1.5

## 2013-11-20 MED ORDER — MORPHINE SULFATE 2 MG/ML IJ SOLN
2.0000 mg | Freq: Once | INTRAMUSCULAR | Status: AC
  Administered 2013-11-20: 2 mg via INTRAVENOUS
  Filled 2013-11-20: qty 1

## 2013-11-20 NOTE — ED Notes (Addendum)
Assisted patient with use of bedpan. Back pain present when patient is turned, pt grimaces and says"oh".

## 2013-11-20 NOTE — ED Notes (Signed)
MD Wentz at bedside.  

## 2013-11-20 NOTE — ED Notes (Signed)
Pt from [facility] on [address] via [facility] c/o back pain that began during PT while practing getting in and out of car. Pt alert and oriented per baseline. HX of Parkinson's.   Daughter reports patient is not acting like self. Patient's face is red  In appearance and she has a red rash to lower extremities.

## 2013-11-20 NOTE — ED Notes (Signed)
Pt transported to CT ?

## 2013-11-20 NOTE — ED Notes (Signed)
Pt left knee appears red and swollen.

## 2013-11-20 NOTE — ED Notes (Signed)
Pt had episode of urinary incontinence.New linens applied, new gown provided and patient repositioned in bed. Daughter and personal caregiver remain present at bedside.

## 2013-11-20 NOTE — ED Notes (Signed)
Daughter concerned about pt missing her doses of senemet and BP meds.

## 2013-11-20 NOTE — ED Provider Notes (Signed)
CSN: [id number]     Arrival date & time 11/20/13  1807 History   First MD Initiated Contact with Patient 11/20/13 2132     Chief Complaint  Patient presents with  . Back Pain     (Consider location/radiation/quality/duration/timing/severity/associated sxs/prior Treatment) Patient is a 80 y.o. female presenting with back pain. The history is provided by the patient and a relative.  Back Pain  She presents for evaluation of back pain, that started several days ago, and is worsening. There's been no specific trauma. She has been working with PT recently on transferring from wheelchair to car. She is usually quite vigorous, and able to walk independently. In the last week. She has required a walker or wheelchair, but has severe pain with ambulation. She states that the pain is in the middle of her back and she denies abdominal pain. Her family members and caregiver are concerned that she has decreased stooling, and that her belly is swelling. Has been no known fever, chills, cough, shortness of breath, chest pain, weakness, or dizziness. She is DO NOT RESUSCITATE. She is apparently taking her medications as prescribed. She has 24-hour care at her assisted living facility. No other known modifying factors.   Past Medical History  Diagnosis Date  . Parkinson's disease   . Stroke   . Hypertension   . Arthritis     osteoarthritis  . Scoliosis   . Memory loss 06/09/2012  . Aortic valve disorders 04/11/2012  . Dementia in conditions classified elsewhere without behavioral disturbance 04/07/2012  . Hyperlipidemia   . Osteoarthrosis, unspecified whether generalized or localized, unspecified site   . Insomnia, unspecified 03/20/2012  . Fracture closed, humerus 03/20/2012  . Other closed fractures of distal end of radius (alone) 03/20/2012  . Transient ischemic attack (TIA), and cerebral infarction without residual deficits(V12.54)   . Unspecified constipation   . Debility, unspecified 03/18/2012  .  Anxiety   . Generalized anxiety disorder   . Urinary incontinence 07/05/2013    Re: PD   Past Surgical History  Procedure Laterality Date  . Breast surgery      benign lumpectomy  . Cholecystectomy     Family History  Problem Relation Age of Onset  . Heart disease Son     CAD  . Diabetes Mother   . Diabetes Sister   . Hypertension Sister    History  Substance Use Topics  . Smoking status: Never Smoker   . Smokeless tobacco: Never Used  . Alcohol Use: No   OB History   Grav Para Term Preterm Abortions TAB SAB Ect Mult Living                 Review of Systems  Musculoskeletal: Positive for back pain.  All other systems reviewed and are negative.     Allergies  Iodine and Iohexol  Home Medications   Prior to Admission medications   Medication Sig Start Date End Date Taking? Authorizing Provider  acetaminophen (TYLENOL) 325 MG tablet Take 650 mg by mouth. Take 2 three times daily as needed   Yes Historical Provider, MD  amLODipine (NORVASC) 2.5 MG tablet Take 2.5 mg by mouth daily.   Yes Historical Provider, MD  antiseptic oral rinse (BIOTENE) LIQD 15 mLs by Mouth Rinse route. Use 4 times daily before meals and bedtime   Yes Historical Provider, MD  aspirin 81 MG tablet Take 81 mg by mouth daily.   Yes Historical Provider, MD  carbidopa-levodopa (SINEMET CR) 50-200 MG per tablet  Take 1 tablet by mouth at bedtime.   Yes Historical Provider, MD  carbidopa-levodopa (SINEMET IR) 25-100 MG per tablet Take 1 1/2 tablet 4 times a day, at 6 am, 10 am, 2 pm, and 6 pm 11/07/13  Yes Star Age, MD  diclofenac sodium (VOLTAREN) 1 % GEL Apply 2 g topically. Use 2 grams to each shoulder three times daily to reduce pain   Yes Historical Provider, MD  escitalopram (LEXAPRO) 10 MG tablet Take 10 mg by mouth daily.   Yes Historical Provider, MD  loperamide (IMODIUM) 2 MG capsule Take 2 mg by mouth every 6 (six) hours as needed for diarrhea or loose stools.   Yes Historical Provider, MD   Magnesium Hydroxide (MILK OF MAGNESIA PO) Take 45 mLs by mouth once as needed (constipation).   Yes Historical Provider, MD  Melatonin 10 MG TABS Take 10 mg by mouth daily.   Yes Historical Provider, MD  metoprolol succinate (TOPROL-XL) 25 MG 24 hr tablet Take 12.5 mg by mouth at bedtime.   Yes Historical Provider, MD   BP 188/80  Pulse 89  Temp(Src) 99.4 F (37.4 C) (Rectal)  Resp 18  SpO2 90% Physical Exam  Nursing note and vitals reviewed. Constitutional: She appears well-developed.  Elderly, frail  HENT:  Head: Normocephalic and atraumatic.  Eyes: Conjunctivae and EOM are normal. Pupils are equal, round, and reactive to light.  Neck: Normal range of motion and phonation normal. Neck supple.  Cardiovascular: Normal rate, regular rhythm and intact distal pulses.   Pulmonary/Chest: Effort normal and breath sounds normal. She exhibits no tenderness.  Abdominal: Soft. She exhibits distension. There is tenderness (Mild diffuse tenderness evidenced by grimacing). There is no guarding.  Musculoskeletal: Normal range of motion.  Mild mid back, tenderness, without deformity or step-off.  Neurological: She is alert. No cranial nerve deficit. She exhibits normal muscle tone.  Skin: Skin is warm and dry.  Psychiatric: She has a normal mood and affect. Her behavior is normal.    ED Course  Procedures (including critical care time) Medications  morphine 2 MG/ML injection 2 mg (2 mg Intravenous Given 11/20/13 2258)  ondansetron (ZOFRAN) injection 4 mg (4 mg Intravenous Given 11/20/13 2258)  carbidopa-levodopa (SINEMET IR) 25-100 MG per tablet immediate release 1.5 tablet (1.5 tablets Oral Given 11/20/13 2320)    Patient Vitals for the past 24 hrs:  BP Temp Temp src Pulse Resp SpO2  11/20/13 2318 188/80 mmHg - - 89 18 90 %  11/20/13 2123 - 99.4 F (37.4 C) Rectal - - -  11/20/13 1930 188/81 mmHg - - 86 - 93 %  11/20/13 1919 198/94 mmHg - - - 16 93 %  11/20/13 1840 - - - - - 93 %   11/20/13 1819 197/99 mmHg 98.7 F (37.1 C) Oral 83 18 83 %    11:50 PM Reevaluation with update and discussion. After initial assessment and treatment, an updated evaluation reveals nursing orders EKG because the patient was tachycardic. It is abnormal. [place] L \  12:40 AM- Lopressor, heart rate gradually decreased to the 70s, but relatively quickly increased back to the 130s.  12:47 AM-Consult complete with hospitalist, Dr.  Roel Cluck  . Patient case explained and discussed. She agrees to admit patient for further evaluation and treatment. Call ended at 00:59   Date: 11/20/13- 23:42  Rate: 116  Rhythm: indeterminate- Atrial Flutter vs. Sinus Tachycardia with nonspecific arrhythmia   QRS Axis: normal  PR and QT Intervals: normal  ST/T Wave abnormalities:  nonspecific ST changes - diffuse  PR and QRS Conduction Disutrbances:none  Narrative Interpretation:   Old EKG Reviewed: changes noted   CRITICAL CARE Performed by: Daleen Bo L Total critical care time: 40 minutes Critical care time was exclusive of separately billable procedures and treating other patients. Critical care was necessary to treat or prevent imminent or life-threatening deterioration. Critical care was time spent personally by me on the following activities: development of treatment plan with patient and/or surrogate as well as nursing, discussions with consultants, evaluation of patient's response to treatment, examination of patient, obtaining history from patient or surrogate, ordering and performing treatments and interventions, ordering and review of laboratory studies, ordering and review of radiographic studies, pulse oximetry and re-evaluation of patient's condition.   Labs Review Labs Reviewed  BASIC METABOLIC PANEL - Abnormal; Notable for the following:    Sodium 136 (*)    Chloride 94 (*)    Glucose, Bld 138 (*)    Creatinine, Ser 0.46 (*)    GFR calc non Af Amer 88 (*)    All other components  within normal limits  CBC - Abnormal; Notable for the following:    WBC 10.6 (*)    All other components within normal limits  URINALYSIS, ROUTINE W REFLEX MICROSCOPIC - Abnormal; Notable for the following:    APPearance TURBID (*)    Protein, ur 30 (*)    All other components within normal limits  URINE MICROSCOPIC-ADD ON - Abnormal; Notable for the following:    Casts HYALINE CASTS (*)    Crystals TRIPLE PHOSPHATE CRYSTALS (*)    All other components within normal limits    Imaging Review No results found.   EKG Interpretation   Date/Time:  Monday November 20 2013 23:58:05 EDT Ventricular Rate:  151 PR Interval:    QRS Duration: 86 QT Interval:  262 QTC Calculation: 415 R Axis:   -18 Text Interpretation:  Atrial fibrillation with rapid V-rate Ventricular  premature complex Borderline left axis deviation Repolarization  abnormality, prob rate related Since last tracing of earlier today at  23:42- rate faster and now in Atrial Fibrillation Confirmed by [facility]  MD,  Jacksyn Beeks ([id number]) on 11/21/2013 12:05:08 AM      MDM   Final diagnoses:  Midline thoracic back pain  Thoracic compression fracture, closed, initial encounter  Atrial fibrillation with RVR  Generalized abdominal pain  Constipation, unspecified constipation type    Back pain with new thoracic compression fracture. Spontaneous atrial fibrillation with rapid to her response while in the department. This is apparently a new problem for her. She has improved pain-wise and blood pressure, with treatment here. She'll need to be admitted for further treatment and management   Nursing Notes Reviewed/ Care Coordinated Applicable Imaging Reviewed Interpretation of Laboratory Data incorporated into ED treatment    Richarda Blade, MD 11/21/13 0105

## 2013-11-20 NOTE — ED Notes (Signed)
Daughter reports patient is unable to walk due to pain and she normally walks with a walker.

## 2013-11-21 ENCOUNTER — Encounter (HOSPITAL_COMMUNITY): Payer: Self-pay | Admitting: Internal Medicine

## 2013-11-21 DIAGNOSIS — R5381 Other malaise: Secondary | ICD-10-CM | POA: Diagnosis present

## 2013-11-21 DIAGNOSIS — M545 Low back pain, unspecified: Secondary | ICD-10-CM | POA: Diagnosis not present

## 2013-11-21 DIAGNOSIS — I4891 Unspecified atrial fibrillation: Secondary | ICD-10-CM | POA: Diagnosis present

## 2013-11-21 DIAGNOSIS — I495 Sick sinus syndrome: Secondary | ICD-10-CM | POA: Diagnosis present

## 2013-11-21 DIAGNOSIS — I517 Cardiomegaly: Secondary | ICD-10-CM | POA: Diagnosis not present

## 2013-11-21 DIAGNOSIS — S22009A Unspecified fracture of unspecified thoracic vertebra, initial encounter for closed fracture: Secondary | ICD-10-CM | POA: Diagnosis not present

## 2013-11-21 DIAGNOSIS — I10 Essential (primary) hypertension: Secondary | ICD-10-CM

## 2013-11-21 DIAGNOSIS — K5900 Constipation, unspecified: Secondary | ICD-10-CM | POA: Diagnosis not present

## 2013-11-21 DIAGNOSIS — G20 Parkinson's disease: Secondary | ICD-10-CM

## 2013-11-21 DIAGNOSIS — I455 Other specified heart block: Secondary | ICD-10-CM | POA: Diagnosis not present

## 2013-11-21 DIAGNOSIS — Z7982 Long term (current) use of aspirin: Secondary | ICD-10-CM | POA: Diagnosis not present

## 2013-11-21 DIAGNOSIS — F0390 Unspecified dementia without behavioral disturbance: Secondary | ICD-10-CM | POA: Diagnosis present

## 2013-11-21 DIAGNOSIS — E785 Hyperlipidemia, unspecified: Secondary | ICD-10-CM | POA: Diagnosis present

## 2013-11-21 DIAGNOSIS — I359 Nonrheumatic aortic valve disorder, unspecified: Secondary | ICD-10-CM | POA: Diagnosis present

## 2013-11-21 DIAGNOSIS — Z79899 Other long term (current) drug therapy: Secondary | ICD-10-CM | POA: Diagnosis not present

## 2013-11-21 DIAGNOSIS — S22000A Wedge compression fracture of unspecified thoracic vertebra, initial encounter for closed fracture: Secondary | ICD-10-CM | POA: Diagnosis present

## 2013-11-21 DIAGNOSIS — F411 Generalized anxiety disorder: Secondary | ICD-10-CM | POA: Diagnosis present

## 2013-11-21 DIAGNOSIS — Z66 Do not resuscitate: Secondary | ICD-10-CM | POA: Diagnosis present

## 2013-11-21 DIAGNOSIS — E876 Hypokalemia: Secondary | ICD-10-CM | POA: Diagnosis present

## 2013-11-21 DIAGNOSIS — Z8673 Personal history of transient ischemic attack (TIA), and cerebral infarction without residual deficits: Secondary | ICD-10-CM | POA: Diagnosis not present

## 2013-11-21 DIAGNOSIS — M8448XA Pathological fracture, other site, initial encounter for fracture: Secondary | ICD-10-CM | POA: Diagnosis present

## 2013-11-21 DIAGNOSIS — M4120 Other idiopathic scoliosis, site unspecified: Secondary | ICD-10-CM | POA: Diagnosis present

## 2013-11-21 DIAGNOSIS — M1990 Unspecified osteoarthritis, unspecified site: Secondary | ICD-10-CM | POA: Diagnosis present

## 2013-11-21 LAB — COMPREHENSIVE METABOLIC PANEL
ALT: <5 U/L (ref 0–35)
ANION GAP: 12 (ref 5–15)
AST: 9 U/L (ref 0–37)
Albumin: 3.4 g/dL — ABNORMAL LOW (ref 3.5–5.2)
Alkaline Phosphatase: 55 U/L (ref 39–117)
BUN: 12 mg/dL (ref 6–23)
CALCIUM: 8.9 mg/dL (ref 8.4–10.5)
CO2: 27 mEq/L (ref 19–32)
Chloride: 99 mEq/L (ref 96–112)
Creatinine, Ser: 0.41 mg/dL — ABNORMAL LOW (ref 0.50–1.10)
GFR calc Af Amer: >90 mL/min (ref >90)
GFR calc non Af Amer: >90 mL/min (ref >90)
Glucose, Bld: 147 mg/dL — ABNORMAL HIGH (ref 70–99)
Potassium: 3.6 mEq/L — ABNORMAL LOW (ref 3.7–5.3)
SODIUM: 138 meq/L (ref 137–147)
TOTAL PROTEIN: 6.7 g/dL (ref 6.0–8.3)
Total Bilirubin: 1 mg/dL (ref 0.3–1.2)

## 2013-11-21 LAB — TROPONIN I: Troponin I: <0.3 ng/mL (ref <0.30)

## 2013-11-21 LAB — CBC
HCT: 43.5 % (ref 36.0–46.0)
HEMOGLOBIN: 14.4 g/dL (ref 12.0–15.0)
MCH: 31.9 pg (ref 26.0–34.0)
MCHC: 33.1 g/dL (ref 30.0–36.0)
MCV: 96.5 fL (ref 78.0–100.0)
Platelets: 212 10*3/uL (ref 150–400)
RBC: 4.51 MIL/uL (ref 3.87–5.11)
RDW: 12.3 % (ref 11.5–15.5)
WBC: 10.4 10*3/uL (ref 4.0–10.5)

## 2013-11-21 LAB — PHOSPHORUS: Phosphorus: 2.7 mg/dL (ref 2.3–4.6)

## 2013-11-21 LAB — TSH: TSH: 1.47 u[IU]/mL (ref 0.350–4.500)

## 2013-11-21 LAB — MAGNESIUM: MAGNESIUM: 2.5 mg/dL (ref 1.5–2.5)

## 2013-11-21 LAB — PRO B NATRIURETIC PEPTIDE: PRO B NATRI PEPTIDE: 1741 pg/mL — AB (ref 0–450)

## 2013-11-21 LAB — MRSA PCR SCREENING: MRSA by PCR: NEGATIVE

## 2013-11-21 LAB — I-STAT TROPONIN, ED: Troponin i, poc: 0.02 ng/mL (ref 0.00–0.08)

## 2013-11-21 LAB — LACTIC ACID, PLASMA: LACTIC ACID, VENOUS: 1.2 mmol/L (ref 0.5–2.2)

## 2013-11-21 MED ORDER — ASPIRIN EC 81 MG PO TBEC
81.0000 mg | DELAYED_RELEASE_TABLET | Freq: Every day | ORAL | Status: DC
  Administered 2013-11-21 – 2013-11-23 (×3): 81 mg via ORAL
  Filled 2013-11-21 (×3): qty 1

## 2013-11-21 MED ORDER — CETYLPYRIDINIUM CHLORIDE 0.05 % MT LIQD: 7.0000 mL | Freq: Two times a day (BID) | OROMUCOSAL | Status: DC

## 2013-11-21 MED ORDER — ONDANSETRON HCL 4 MG/2ML IJ SOLN
4.0000 mg | Freq: Four times a day (QID) | INTRAMUSCULAR | Status: DC | PRN
Start: 2013-11-21 — End: 2013-11-23

## 2013-11-21 MED ORDER — SODIUM CHLORIDE 0.9 % IV SOLN
INTRAVENOUS | Status: AC
  Administered 2013-11-21: 02:00:00 via INTRAVENOUS

## 2013-11-21 MED ORDER — ACETAMINOPHEN 650 MG RE SUPP: 650.0000 mg | Freq: Four times a day (QID) | RECTAL | Status: DC | PRN

## 2013-11-21 MED ORDER — PANTOPRAZOLE SODIUM 40 MG IV SOLR
40.0000 mg | Freq: Every day | INTRAVENOUS | Status: DC
  Administered 2013-11-21: 40 mg via INTRAVENOUS
  Filled 2013-11-21 (×2): qty 40

## 2013-11-21 MED ORDER — BISACODYL 5 MG PO TBEC
10.0000 mg | DELAYED_RELEASE_TABLET | Freq: Once | ORAL | Status: AC
  Administered 2013-11-21: 10 mg via ORAL
  Filled 2013-11-21: qty 2

## 2013-11-21 MED ORDER — ESCITALOPRAM OXALATE 10 MG PO TABS
10.0000 mg | ORAL_TABLET | Freq: Every day | ORAL | Status: DC
  Administered 2013-11-21 – 2013-11-23 (×3): 10 mg via ORAL
  Filled 2013-11-21 (×3): qty 1

## 2013-11-21 MED ORDER — PANTOPRAZOLE SODIUM 40 MG PO TBEC: 40.0000 mg | DELAYED_RELEASE_TABLET | Freq: Every day | ORAL | Status: DC

## 2013-11-21 MED ORDER — CARBIDOPA-LEVODOPA ER 50-200 MG PO TBCR
1.0000 | EXTENDED_RELEASE_TABLET | Freq: Once | ORAL | Status: AC
  Administered 2013-11-21: 1 via ORAL
  Filled 2013-11-21: qty 1

## 2013-11-21 MED ORDER — KETOROLAC TROMETHAMINE 15 MG/ML IJ SOLN
15.0000 mg | Freq: Three times a day (TID) | INTRAMUSCULAR | Status: DC
  Administered 2013-11-21 – 2013-11-23 (×5): 15 mg via INTRAVENOUS
  Filled 2013-11-21 (×7): qty 1

## 2013-11-21 MED ORDER — MORPHINE SULFATE 2 MG/ML IJ SOLN
1.0000 mg | INTRAMUSCULAR | Status: DC | PRN
  Administered 2013-11-21 – 2013-11-22 (×4): 1 mg via INTRAVENOUS
  Filled 2013-11-21 (×4): qty 1

## 2013-11-21 MED ORDER — DIGOXIN 125 MCG PO TABS
0.1250 mg | ORAL_TABLET | Freq: Once | ORAL | Status: AC
  Administered 2013-11-21: 0.125 mg via ORAL
  Filled 2013-11-21: qty 1

## 2013-11-21 MED ORDER — HYDRALAZINE HCL 20 MG/ML IJ SOLN
5.0000 mg | Freq: Four times a day (QID) | INTRAMUSCULAR | Status: DC | PRN
  Administered 2013-11-23: 5 mg via INTRAVENOUS
  Filled 2013-11-21: qty 1

## 2013-11-21 MED ORDER — ACETAMINOPHEN 325 MG PO TABS
650.0000 mg | ORAL_TABLET | Freq: Four times a day (QID) | ORAL | Status: DC | PRN
Start: 2013-11-21 — End: 2013-11-23

## 2013-11-21 MED ORDER — PANTOPRAZOLE SODIUM 40 MG IV SOLR
40.0000 mg | Freq: Every day | INTRAVENOUS | Status: DC
  Filled 2013-11-21: qty 40

## 2013-11-21 MED ORDER — DOCUSATE SODIUM 100 MG PO CAPS
100.0000 mg | ORAL_CAPSULE | Freq: Two times a day (BID) | ORAL | Status: DC
  Administered 2013-11-21 – 2013-11-23 (×5): 100 mg via ORAL
  Filled 2013-11-21 (×4): qty 1

## 2013-11-21 MED ORDER — CETYLPYRIDINIUM CHLORIDE 0.05 % MT LIQD
7.0000 mL | Freq: Two times a day (BID) | OROMUCOSAL | Status: DC
  Administered 2013-11-21 – 2013-11-23 (×4): 7 mL via OROMUCOSAL

## 2013-11-21 MED ORDER — SODIUM CHLORIDE 0.9 % IJ SOLN
3.0000 mL | Freq: Two times a day (BID) | INTRAMUSCULAR | Status: DC
  Administered 2013-11-22 – 2013-11-23 (×3): 3 mL via INTRAVENOUS

## 2013-11-21 MED ORDER — HYDROCODONE-ACETAMINOPHEN 5-325 MG PO TABS
1.0000 | ORAL_TABLET | ORAL | Status: DC | PRN
  Administered 2013-11-21 – 2013-11-23 (×3): 2 via ORAL
  Filled 2013-11-21 (×3): qty 2

## 2013-11-21 MED ORDER — CARBIDOPA-LEVODOPA 25-100 MG PO TABS
1.5000 | ORAL_TABLET | Freq: Three times a day (TID) | ORAL | Status: DC
  Administered 2013-11-22 (×3): 1.5 via ORAL
  Filled 2013-11-21 (×4): qty 1.5

## 2013-11-21 MED ORDER — SENNA 8.6 MG PO TABS
1.0000 | ORAL_TABLET | Freq: Two times a day (BID) | ORAL | Status: DC
  Administered 2013-11-21 – 2013-11-23 (×5): 8.6 mg via ORAL
  Filled 2013-11-21 (×6): qty 1

## 2013-11-21 MED ORDER — ONDANSETRON HCL 4 MG PO TABS: 4.0000 mg | ORAL_TABLET | Freq: Four times a day (QID) | ORAL | Status: DC | PRN

## 2013-11-21 MED ORDER — METOPROLOL TARTRATE 1 MG/ML IV SOLN
5.0000 mg | Freq: Once | INTRAVENOUS | Status: AC
  Administered 2013-11-21: 5 mg via INTRAVENOUS
  Filled 2013-11-21: qty 5

## 2013-11-21 MED ORDER — CARBIDOPA-LEVODOPA ER 50-200 MG PO TBCR
1.0000 | EXTENDED_RELEASE_TABLET | Freq: Every day | ORAL | Status: DC
  Administered 2013-11-21 – 2013-11-22 (×2): 1 via ORAL
  Filled 2013-11-21 (×3): qty 1

## 2013-11-21 MED ORDER — POLYETHYLENE GLYCOL 3350 17 G PO PACK
17.0000 g | PACK | Freq: Every day | ORAL | Status: DC | PRN
Start: 2013-11-21 — End: 2013-11-22
  Filled 2013-11-21: qty 1

## 2013-11-21 MED ORDER — DILTIAZEM HCL 100 MG IV SOLR
5.0000 mg/h | Freq: Once | INTRAVENOUS | Status: AC
  Administered 2013-11-21: 5 mg/h via INTRAVENOUS
  Filled 2013-11-21: qty 100

## 2013-11-21 MED ORDER — CARBIDOPA-LEVODOPA 25-100 MG PO TABS
1.5000 | ORAL_TABLET | Freq: Once | ORAL | Status: AC
  Administered 2013-11-21: 1.5 via ORAL
  Filled 2013-11-21: qty 1.5

## 2013-11-21 MED ORDER — POLYETHYLENE GLYCOL 3350 17 G PO PACK
17.0000 g | PACK | Freq: Every day | ORAL | Status: DC
  Administered 2013-11-21 – 2013-11-22 (×2): 17 g via ORAL
  Filled 2013-11-21 (×2): qty 1

## 2013-11-21 MED ORDER — SODIUM CHLORIDE 0.9 % IV BOLUS (SEPSIS)
250.0000 mL | Freq: Once | INTRAVENOUS | Status: AC
  Administered 2013-11-21: 250 mL via INTRAVENOUS

## 2013-11-21 MED ORDER — ENOXAPARIN SODIUM 40 MG/0.4ML ~~LOC~~ SOLN
40.0000 mg | SUBCUTANEOUS | Status: DC
Start: 2013-11-21 — End: 2013-11-23
  Administered 2013-11-21 – 2013-11-23 (×3): 40 mg via SUBCUTANEOUS
  Filled 2013-11-21 (×3): qty 0.4

## 2013-11-21 NOTE — Evaluation (Signed)
Clinical/Bedside Swallow Evaluation Patient Details  Name: Vanessa Mcbride MRN: [id number] Date of Birth: 04-Jul-1924  Today's Date: 11/21/2013 Time: 1240-1305 SLP Time Calculation (min): 25 min  Past Medical History:  Past Medical History  Diagnosis Date  . Parkinson's disease   . Hypertension   . Arthritis     osteoarthritis  . Scoliosis   . Memory loss 06/09/2012  . Aortic valve disorders 04/11/2012  . Dementia in conditions classified elsewhere without behavioral disturbance 04/07/2012  . Hyperlipidemia   . Osteoarthrosis, unspecified whether generalized or localized, unspecified site   . Insomnia, unspecified 03/20/2012  . Fracture closed, humerus 03/20/2012  . Other closed fractures of distal end of radius (alone) 03/20/2012  . Transient ischemic attack (TIA), and cerebral infarction without residual deficits(V12.54)   . Unspecified constipation   . Debility, unspecified 03/18/2012  . Anxiety   . Generalized anxiety disorder   . Urinary incontinence 07/05/2013    Re: PD   Past Surgical History:  Past Surgical History  Procedure Laterality Date  . Breast surgery      benign lumpectomy  . Cholecystectomy     HPI:  80 year old female, resident of [place], with history of Parkinson disease presents with severe constipation and back pain likely secondary to new compression fracture; was found to have atrial fibrillation with RVR treated with diltiazem and developed sinus pauses up to 6 seconds;  transferred to [facility] from [facility] for cardiology care. Per caregiver, pt has had mild swallowing deficits in the past, with coughing associated with straw use.  Pt was on a mechanical soft diet, thin liquids at [facility]; needed assistance with feeding.       Assessment / Plan / Recommendation Clinical Impression  Pt presents with s/s of a mild dysphagia with risk for aspiration.  Motor deficits are consistent with a Parkinson's etiology.  Tolerated sips of thin liquid from spoon and  purees adequately; HOB reclined due to back pain.  Demonstrated sufficient oral recognition, mastication of purees, oral holding of whole meds.  Executed consistent swallow response that appeared protective when limiting bolus size.  Use of straw elicited explosive cough.Recommend initiating a dysphagia 1 diet with thin liquids from spoon; will follow for readiness for diet advancement.  Son agrees with plan.      Aspiration Risk  Mild    Diet Recommendation Dysphagia 1 (Puree);Thin liquid   Liquid Administration via: Spoon;No straw Medication Administration: Whole meds with puree Supervision: Staff to assist with self feeding Compensations: Slow rate;Small sips/bites Postural Changes and/or Swallow Maneuvers:  (HOB as upright as pt can tolerate)    Other  Recommendations Oral Care Recommendations: Oral care BID   Follow Up Recommendations   (tba)    Frequency and Duration min 2x/week  2 weeks   Swallow Study Prior Functional Status       General Date of Onset: 11/20/13 HPI: 80 year old female with history of Parkinson disease presents with severe constipation and back pain likely secondary to new compression fracture; was found to have atrial fibrillation with RVR treated with diltiazem and developed sinus pauses up to 6 seconds;  transferred to [facility] for cardiology care. Per caregiver, pt has had mild swallowing deficits in the past, with coughing associated with straw use.  Pt was on a mechanical soft diet, thin liquids at [facility]; needed assistance with feeding.     Type of Study: Bedside swallow evaluation Previous Swallow Assessment: none per records Diet Prior to this Study: NPO Temperature Spikes Noted: No Respiratory  Status: Nasal cannula History of Recent Intubation: No Behavior/Cognition: Alert Oral Cavity - Dentition: Adequate natural dentition Self-Feeding Abilities: Needs assist Patient Positioning: Partially reclined (due to back pain) Baseline Vocal  Quality: Low vocal intensity;Breathy Volitional Cough: Weak Volitional Swallow: Unable to elicit    Oral/Motor/Sensory Function Overall Oral Motor/Sensory Function:  (limited ROM of articulators)   Ice Chips Ice chips: Not tested   Thin Liquid Thin Liquid: Impaired Presentation: Spoon;Straw Pharyngeal  Phase Impairments: Multiple swallows;Cough - Immediate (cough with straw use; no cough when using spoon)    Nectar Thick Nectar Thick Liquid: Not tested   Honey Thick Honey Thick Liquid: Not tested   Puree Puree: Within functional limits Presentation: [place]. [place], [place] CCC/SLP Pager [phone]     Solid: Not tested       Juan Quam Laurice 11/21/2013,1:53 PM

## 2013-11-21 NOTE — Clinical Social Work Psychosocial (Signed)
Clinical Social Work Department BRIEF PSYCHOSOCIAL ASSESSMENT 11/21/2013  Patient:  Vanessa Mcbride, Vanessa Mcbride     Account Number:  [id number]     Admit date:  11/20/2013  Clinical Social Worker:  Delrae Sawyers  Date/Time:  11/21/2013 03:41 PM  Referred by:  Physician  Date Referred:  11/21/2013 Referred for  SNF Placement   Other Referral:   none.   Interview type:  Family Other interview type:   CSW spoke with pt's daughter, Jessamine Barcia [phone]), regarding discharge disposition.    PSYCHOSOCIAL DATA Living Status:  FACILITY Admitted from facility:  [facility] Level of care:  Assisted Living Primary support name:  Yeni Jiggetts Primary support relationship to patient:  CHILD, ADULT Degree of support available:   Strong support system.    CURRENT CONCERNS Current Concerns  Post-Acute Placement   Other Concerns:   none.    SOCIAL WORK ASSESSMENT / PLAN CSW received referral regarding pt admitted from facility ([facility]). CSW spoke with pt's daughter, [name], regarding discharge planning. Pt's daughter stated pt will be discharged back to [facility] facility once medically stable for discharge. Pt's daughter granted CSW access to contact [facility] regarding pt's discharge.    CSW to continue to follow and assist with discharge planning as pt becomes medically stable for discharge.   Assessment/plan status:  Psychosocial Support/Ongoing Assessment of Needs Other assessment/ plan:   none.   Information/referral to community resources:   Pt to return to [facility], possibly at a higher level of care.    PATIENT'S/FAMILY'S RESPONSE TO PLAN OF CARE: Pt's daughter understanding and agreeable to CSW plan of care. Pt's daughter expressed no further questions or concerns at this time.       Lubertha Sayres, MSW, [facility] Licensed Clinical Social Worker [phone] and [phone]

## 2013-11-21 NOTE — ED Notes (Signed)
Report given to [facility] [id number]

## 2013-11-21 NOTE — H&P (Signed)
YYT:KPTWS, Viviann Spare, MD    Chief Complaint:  Back pain  HPI: Vanessa Mcbride is a 80 y.o. female   has a past medical history of Parkinson's disease; Hypertension; Arthritis; Scoliosis; Memory loss (06/09/2012); Aortic valve disorders (04/11/2012); Dementia in conditions classified elsewhere without behavioral disturbance (04/07/2012); Hyperlipidemia; Osteoarthrosis, unspecified whether generalized or localized, unspecified site; Insomnia, unspecified (03/20/2012); Fracture closed, humerus (03/20/2012); Other closed fractures of distal end of radius (alone) (03/20/2012); Transient ischemic attack (TIA), and cerebral infarction without residual deficits(V12.54); Unspecified constipation; Debility, unspecified (03/18/2012); Anxiety; Generalized anxiety disorder; and Urinary incontinence (07/05/2013).   Presented with  Patient was doing well up until Sunday morning August 30 when she developed back discomfort. Patient refused to go to church. Today her daughter who is a retired physician not dictated increased complaints of back pain. She was brought to emergency department at [facility] along. Patient had a CT scan of her abdomen and chest showing T. 10 compression fracture new since CT scan in 2014. She will also noted to have heavy stool burden as well. While being observed in the emergency department patient developed new onset of atrial fibrillation with RVR with heart rates as high as 170s. Patient was given 5 mg of metoprolol IV. Her previously elevated systolic blood pressure up to 190s had decreased to 105.Marland Kitchen She was given IV fluids and switch to diltiazem drip. Diltiazem drip was going at 5 when patient developed sinus bradycardia and pauses up to 6 seconds. She continued to have intermittent shorter pauses between 2-3 seconds despite diltiazem being discontinued. Patient went back into A. fib with RVR with heart rates up to 140s Spoke to cardiology who recommends transfer to [facility] for further  monitoring.  Of note  Family patient has been severely constipated with no bowel movements for the past 3-4 days. Recently has Sinemet has been increased due to her rigidity. Her neurologist warned of constipation is likely side effect. Hospitalist was called for admission for a.fib w RVR  Review of Systems:    Pertinent positives include: constipations, back pain  Constitutional:  No weight loss, night sweats, Fevers, chills, fatigue, weight loss  HEENT:  No headaches, Difficulty swallowing,Tooth/dental problems,Sore throat,  No sneezing, itching, ear ache, nasal congestion, post nasal drip,  Cardio-vascular:  No chest pain, Orthopnea, PND, anasarca, dizziness, palpitations.no Bilateral lower extremity swelling  GI:  No heartburn, indigestion, abdominal pain, nausea, vomiting, diarrhea, change in bowel habits, loss of appetite, melena, blood in stool, hematemesis Resp:  no shortness of breath at rest. No dyspnea on exertion, No excess mucus, no productive cough, No non-productive cough, No coughing up of blood.No change in color of mucus.No wheezing. Skin:  no rash or lesions. No jaundice GU:  no dysuria, change in color of urine, no urgency or frequency. No straining to urinate.  No flank pain.  Musculoskeletal:  No joint pain or no joint swelling. No decreased range of motion. No back pain.  Psych:  No change in mood or affect. No depression or anxiety. No memory loss.  Neuro: no localizing neurological complaints, no tingling, no weakness, no double vision, no gait abnormality, no slurred speech, no confusion  Otherwise ROS are negative except for above, 10 systems were reviewed  Past Medical History: Past Medical History  Diagnosis Date  . Parkinson's disease   . Hypertension   . Arthritis     osteoarthritis  . Scoliosis   . Memory loss 06/09/2012  . Aortic valve disorders 04/11/2012  . Dementia in  conditions classified elsewhere without behavioral disturbance  04/07/2012  . Hyperlipidemia   . Osteoarthrosis, unspecified whether generalized or localized, unspecified site   . Insomnia, unspecified 03/20/2012  . Fracture closed, humerus 03/20/2012  . Other closed fractures of distal end of radius (alone) 03/20/2012  . Transient ischemic attack (TIA), and cerebral infarction without residual deficits(V12.54)   . Unspecified constipation   . Debility, unspecified 03/18/2012  . Anxiety   . Generalized anxiety disorder   . Urinary incontinence 07/05/2013    Re: PD   Past Surgical History  Procedure Laterality Date  . Breast surgery      benign lumpectomy  . Cholecystectomy       Medications: Prior to Admission medications   Medication Sig Start Date End Date Taking? Authorizing Provider  acetaminophen (TYLENOL) 325 MG tablet Take 650 mg by mouth. Take 2 three times daily as needed   Yes Historical Provider, MD  amLODipine (NORVASC) 2.5 MG tablet Take 2.5 mg by mouth daily.   Yes Historical Provider, MD  antiseptic oral rinse (BIOTENE) LIQD 15 mLs by Mouth Rinse route. Use 4 times daily before meals and bedtime   Yes Historical Provider, MD  aspirin 81 MG tablet Take 81 mg by mouth daily.   Yes Historical Provider, MD  carbidopa-levodopa (SINEMET CR) 50-200 MG per tablet Take 1 tablet by mouth at bedtime.   Yes Historical Provider, MD  carbidopa-levodopa (SINEMET IR) 25-100 MG per tablet Take 1 1/2 tablet 4 times a day, at 6 am, 10 am, 2 pm, and 6 pm 11/07/13  Yes Star Age, MD  diclofenac sodium (VOLTAREN) 1 % GEL Apply 2 g topically. Use 2 grams to each shoulder three times daily to reduce pain   Yes Historical Provider, MD  escitalopram (LEXAPRO) 10 MG tablet Take 10 mg by mouth daily.   Yes Historical Provider, MD  loperamide (IMODIUM) 2 MG capsule Take 2 mg by mouth every 6 (six) hours as needed for diarrhea or loose stools.   Yes Historical Provider, MD  Magnesium Hydroxide (MILK OF MAGNESIA PO) Take 45 mLs by mouth once as needed  (constipation).   Yes Historical Provider, MD  Melatonin 10 MG TABS Take 10 mg by mouth daily.   Yes Historical Provider, MD  metoprolol succinate (TOPROL-XL) 25 MG 24 hr tablet Take 12.5 mg by mouth at bedtime.   Yes Historical Provider, MD    Allergies:   Allergies  Allergen Reactions  . Iodine Anaphylaxis  . Iohexol      Desc: pt has had contrast in the past (years ago) and it causea anaphylaxis- per pt's daughter.  stephanie davis,rtrct, Onset Date: [id number]     Social History:  Ambulatory    walker    From facility [facility]   reports that she has never smoked. She has never used smokeless tobacco. She reports that she does not drink alcohol or use illicit drugs.    Family History: family history includes Diabetes in her mother and sister; Heart disease in her son; Hypertension in her sister.    Physical Exam: Patient Vitals for the past 24 hrs:  BP Temp Temp src Pulse Resp SpO2  11/21/13 0110 - - - 135 18 99 %  11/21/13 0100 - - - 130 20 99 %  11/21/13 0055 110/63 mmHg - - 151 20 98 %  11/21/13 0050 110/79 mmHg - - 138 18 99 %  11/21/13 0045 110/69 mmHg - - 137 19 99 %  11/21/13 0040 122/69  mmHg - - 123 21 99 %  11/21/13 0035 115/95 mmHg - - 136 19 99 %  11/21/13 0030 126/78 mmHg - - 132 20 98 %  11/21/13 0025 130/81 mmHg - - 96 25 99 %  11/21/13 0020 148/80 mmHg - - 89 19 99 %  11/21/13 0015 148/84 mmHg - - 93 20 99 %  11/21/13 0010 - - - 162 19 98 %  11/21/13 0000 159/82 mmHg - - 144 20 99 %  11/20/13 2345 - - - 151 19 99 %  11/20/13 2330 175/103 mmHg - - 74 - 99 %  11/20/13 2325 - - - 153 - 98 %  11/20/13 2318 188/80 mmHg - - 89 18 90 %  11/20/13 2123 - 99.4 F (37.4 C) Rectal - - -  11/20/13 1930 188/81 mmHg - - 86 - 93 %  11/20/13 1919 198/94 mmHg - - - 16 93 %  11/20/13 1840 - - - - - 93 %  11/20/13 1819 197/99 mmHg 98.7 F (37.1 C) Oral 83 18 83 %    1. General:  in No Acute distress 2. Psychological: Alert and Oriented 3.  Head/ENT:   Dry Mucous Membranes                          Head Non traumatic, neck supple                          Normal Dentition 4. SKIN: normal Skin turgor,  Skin clean Dry and intact no rash 5. Heart: Irregular rapid rate and rhythm no Murmur, Rub or gallop 6. Lungs: Clear to auscultation bilaterally, no wheezes or crackles   7. Abdomen: Soft, non-tender, somewhat distended 8. Lower extremities: no clubbing, cyanosis, or edema 9. Neurologically Grossly intact, moving all 4 extremities equally 10. MSK: Normal range of motion  body mass index is unknown because there is no weight on file.   Labs on Admission:   Recent Labs  11/20/13 2001  NA 136*  K 3.9  CL 94*  CO2 29  GLUCOSE 138*  BUN 12  CREATININE 0.46*  CALCIUM 9.6   No results found for this basename: AST, ALT, ALKPHOS, BILITOT, PROT, ALBUMIN,  in the last 72 hours No results found for this basename: LIPASE, AMYLASE,  in the last 72 hours  Recent Labs  11/20/13 2001  WBC 10.6*  HGB 14.9  HCT 44.4  MCV 94.9  PLT 210   No results found for this basename: CKTOTAL, CKMB, CKMBINDEX, TROPONINI,  in the last 72 hours No results found for this basename: TSH, T4TOTAL, FREET3, T3FREE, THYROIDAB,  in the last 72 hours No results found for this basename: VITAMINB12, FOLATE, FERRITIN, TIBC, IRON, RETICCTPCT,  in the last 72 hours No results found for this basename: HGBA1C    The CrCl is unknown because both a height and weight (above a minimum accepted value) are required for this calculation. ABG    Component Value Date/Time   TCO2 24 08/08/2008 1047     No results found for this basename: DDIMER     Other results:  I have pearsonaly reviewed this: ECG REPORT  Rate: 151  Rhythm: Atrial fibrillation with RVR ST&T Change: No ischemic changes   UA no evidence of UTI  BNP (last 3 results) No results found for this basename: PROBNP,  in the last 8760 hours  There were no vitals filed for  this  visit.   Cultures: No results found for this basename: sdes, specrequest, cult, reptstatus    Radiological Exams on Admission: Ct Abdomen Pelvis Wo Contrast  11/21/2013   CLINICAL DATA:  pain  EXAM: CT CHEST, ABDOMEN AND PELVIS WITHOUT CONTRAST  TECHNIQUE: Multidetector CT imaging of the chest, abdomen and pelvis was performed following the standard protocol without IV contrast.  COMPARISON:  None.  FINDINGS: CT CHEST FINDINGS  Tiny bilateral pleural effusions. Coarse aortic calcifications without aneurysm. No mediastinal hematoma. Dependent atelectasis posteriorly in both lower lobes. Lungs are otherwise clear. Compression fracture deformity of T10 with approximately 20% loss of height, no retropulsion, which was not evident on prior films of 02/26/2013. Small spurs at multiple levels throughout the thoracic spine. Sternum intact.  CT ABDOMEN AND PELVIS FINDINGS  Several well demarcated low-attenuation liver lesions. The larger ones in hepatic segment 3 measure fluid attenuation and probably represent cysts. The smaller ones are nonspecific.  Probable cyst in the upper pole left kidney. No hydronephrosis. Spleen, adrenal glands, and pancreas unremarkable. Unenhanced CT was performed per clinician order. Lack of IV contrast limits sensitivity and specificity, especially for evaluation of abdominal/pelvic solid viscera. Patchy aortic calcifications without aneurysm. Stomach, small bowel, nondilated. There is mild gaseous distention of the proximal colon, decompressed distally without discrete transition zone. Scattered sigmoid diverticula without adjacent inflammatory/edematous change. There is moderate distention of the urinary bladder. Bilateral pelvic phleboliths. Pelvis and lumbar spine intact.  IMPRESSION: 1. Mild T10 vertebral compression fracture deformity, new since 02/26/2013. 2. Small bilateral pleural effusions with dependent atelectasis. 3. Probable hepatic and left renal cysts. 4. Sigmoid  diverticulosis.   Electronically Signed   By: Arne Cleveland M.D.   On: 11/21/2013 00:29   Ct Chest Wo Contrast  11/21/2013   CLINICAL DATA:  pain  EXAM: CT CHEST, ABDOMEN AND PELVIS WITHOUT CONTRAST  TECHNIQUE: Multidetector CT imaging of the chest, abdomen and pelvis was performed following the standard protocol without IV contrast.  COMPARISON:  None.  FINDINGS: CT CHEST FINDINGS  Tiny bilateral pleural effusions. Coarse aortic calcifications without aneurysm. No mediastinal hematoma. Dependent atelectasis posteriorly in both lower lobes. Lungs are otherwise clear. Compression fracture deformity of T10 with approximately 20% loss of height, no retropulsion, which was not evident on prior films of 02/26/2013. Small spurs at multiple levels throughout the thoracic spine. Sternum intact.  CT ABDOMEN AND PELVIS FINDINGS  Several well demarcated low-attenuation liver lesions. The larger ones in hepatic segment 3 measure fluid attenuation and probably represent cysts. The smaller ones are nonspecific.  Probable cyst in the upper pole left kidney. No hydronephrosis. Spleen, adrenal glands, and pancreas unremarkable. Unenhanced CT was performed per clinician order. Lack of IV contrast limits sensitivity and specificity, especially for evaluation of abdominal/pelvic solid viscera. Patchy aortic calcifications without aneurysm. Stomach, small bowel, nondilated. There is mild gaseous distention of the proximal colon, decompressed distally without discrete transition zone. Scattered sigmoid diverticula without adjacent inflammatory/edematous change. There is moderate distention of the urinary bladder. Bilateral pelvic phleboliths. Pelvis and lumbar spine intact.  IMPRESSION: 1. Mild T10 vertebral compression fracture deformity, new since 02/26/2013. 2. Small bilateral pleural effusions with dependent atelectasis. 3. Probable hepatic and left renal cysts. 4. Sigmoid diverticulosis.   Electronically Signed   By: Arne Cleveland M.D.   On: 11/21/2013 00:29    Chart has been reviewed  Assessment/Plan  80 year old female with history of Parkinson disease presents with severe constipation and back pain likely secondary to new compression fracture  was found to have atrial fibrillation with RVR treated with diltiazem and developed sinus pauses up to 6 seconds transfer to [facility] for cardiology care  Present on Admission:  . Atrial fibrillation with RVR - discontinue Cardizem and Toprol given pauses. Check TSH check echo cycle cardiac enzymes cardiology consultant is aware to be paged on [facility]  . Parkinson disease -continue home dose of Sinemet  . HTN (hypertension) - given several blood pressures hold Norvasc and Toprol  . Compression fracture of thoracic vertebra - circuit management for now  . Obstipation - gentle bowel regimen   Prophylaxis:  Lovenox  CODE STATUS: DNR/DNI  Other plan as per orders.  I have spent a total of 75 min on this admission extra time was taken to discuss care with cardiology and family extra time spend stabilizing patient  Thermal 11/21/2013, 1:26 AM  [facility]  Pager [phone]   If 7AM-7PM, please contact the day team taking care of the patient  [email or web address]  Password [email or web address]

## 2013-11-21 NOTE — Progress Notes (Signed)
Patient having 3-4.75 sec pauses.  Cardiology notified and will see patient.  BP 124/86, resting in bed eyes closed.  No complaints, continue to watch.

## 2013-11-21 NOTE — ED Notes (Signed)
Assisted patient onto bedpain.

## 2013-11-21 NOTE — ED Notes (Signed)
Pt sleeping.Daughter and personal caregiver remain at bedside.

## 2013-11-21 NOTE — ED Notes (Signed)
Hospitalist Doutouva at bedside and present when patient experienced a 6 second pause in cardiac rhythm then proceeded back to A-fib.

## 2013-11-21 NOTE — Progress Notes (Signed)
Patient arrived from [facility] ED, HR 130s a-fib, BP 155/85. Patient only oriented to self. Right side noted to be markedly weaker than left. Speech delayed and garbled. Pupils equal, round and, reactive. Daughter feels that Parkinson's is the culprit. Patient has aggressively been declining within the last week, per daughter's report. NP notified. Asked RN to have second RN assess. Second assessment completed by Corie Chiquito, RN. NP notified of his assessment. No new orders received. Will continue to monitor.   Lum Babe, RN

## 2013-11-21 NOTE — Consult Note (Signed)
Reason for Consult: atrial fibrillation with RVR Referring Physician: Dr. [name] is an 80 y.o. female.  HPI: Vanessa Mcbride is an 80 yo woman with PMH of hypertension, dyslipidemia, TIA, dementia, Parkinson's disease who is DNR and came in for back pain that has been progressive. She's normally very mobile but has required assistance with walker or wheelchair.  She is being treated for pain given likely thoracic vertebral fractures and she went into atrial fibrillation + RVR. Initially she was given IV metoprolol but it dropped her blood pressure. Subsequently, she was given IV diltiazem and she had a 6 second pause reportedly leading to Cardiology consultation.  She is also noted to be constipated since Thursday or Friday. She is comfortable during my history and exam. She is accompanied by her daughter. We had a long discussion. She is tolerating the atrial fibrillation quite well. No palpitations. She has had some improvement in her pain.    Past Medical History  Diagnosis Date  . Parkinson's disease   . Hypertension   . Arthritis     osteoarthritis  . Scoliosis   . Memory loss 06/09/2012  . Aortic valve disorders 04/11/2012  . Dementia in conditions classified elsewhere without behavioral disturbance 04/07/2012  . Hyperlipidemia   . Osteoarthrosis, unspecified whether generalized or localized, unspecified site   . Insomnia, unspecified 03/20/2012  . Fracture closed, humerus 03/20/2012  . Other closed fractures of distal end of radius (alone) 03/20/2012  . Transient ischemic attack (TIA), and cerebral infarction without residual deficits(V12.54)   . Unspecified constipation   . Debility, unspecified 03/18/2012  . Anxiety   . Generalized anxiety disorder   . Urinary incontinence 07/05/2013    Re: PD    Past Surgical History  Procedure Laterality Date  . Breast surgery      benign lumpectomy  . Cholecystectomy      Family History  Problem Relation Age of Onset  .  Heart disease Son     CAD  . Diabetes Mother   . Diabetes Sister   . Hypertension Sister     Social History:  reports that she has never smoked. She has never used smokeless tobacco. She reports that she does not drink alcohol or use illicit drugs.  Allergies:  Allergies  Allergen Reactions  . Iodine Anaphylaxis  . Iohexol      Desc: pt has had contrast in the past (years ago) and it causea anaphylaxis- per pt's daughter.  stephanie davis,rtrct, Onset Date: [id number]     Medications:  I have reviewed the patient's current medications. Prior to Admission:  (Not in a hospital admission) Scheduled:  Continuous: . sodium chloride 250 mL (11/21/13 0112)    Results for orders placed during the hospital encounter of 11/20/13 (from the past 48 hour(s))  BASIC METABOLIC PANEL     Status: Abnormal   Collection Time    11/20/13  8:01 PM      Result Value Ref Range   Sodium 136 (*) 137 - 147 mEq/L   Potassium 3.9  3.7 - 5.3 mEq/L   Chloride 94 (*) 96 - 112 mEq/L   CO2 29  19 - 32 mEq/L   Glucose, Bld 138 (*) 70 - 99 mg/dL   BUN 12  6 - 23 mg/dL   Creatinine, Ser 0.46 (*) 0.50 - 1.10 mg/dL   Calcium 9.6  8.4 - 10.5 mg/dL   GFR calc non Af Amer 88 (*) >90 mL/min   GFR  calc Af Amer >90  >90 mL/min   Comment: (NOTE)     The eGFR has been calculated using the CKD EPI equation.     This calculation has not been validated in all clinical situations.     eGFR's persistently <90 mL/min signify possible Chronic Kidney     Disease.   Anion gap 13  5 - 15  CBC     Status: Abnormal   Collection Time    11/20/13  8:01 PM      Result Value Ref Range   WBC 10.6 (*) 4.0 - 10.5 K/uL   RBC 4.68  3.87 - 5.11 MIL/uL   Hemoglobin 14.9  12.0 - 15.0 g/dL   HCT 44.4  36.0 - 46.0 %   MCV 94.9  78.0 - 100.0 fL   MCH 31.8  26.0 - 34.0 pg   MCHC 33.6  30.0 - 36.0 g/dL   RDW 12.3  11.5 - 15.5 %   Platelets 210  150 - 400 K/uL  URINALYSIS, ROUTINE W REFLEX MICROSCOPIC     Status: Abnormal    Collection Time    11/20/13  9:23 PM      Result Value Ref Range   Color, Urine YELLOW  YELLOW   APPearance TURBID (*) CLEAR   Specific Gravity, Urine 1.014  1.005 - 1.030   pH 8.0  5.0 - 8.0   Glucose, UA NEGATIVE  NEGATIVE mg/dL   Hgb urine dipstick NEGATIVE  NEGATIVE   Bilirubin Urine NEGATIVE  NEGATIVE   Ketones, ur NEGATIVE  NEGATIVE mg/dL   Protein, ur 30 (*) NEGATIVE mg/dL   Urobilinogen, UA 0.2  0.0 - 1.0 mg/dL   Nitrite NEGATIVE  NEGATIVE   Leukocytes, UA NEGATIVE  NEGATIVE  URINE MICROSCOPIC-ADD ON     Status: Abnormal   Collection Time    11/20/13  9:23 PM      Result Value Ref Range   Squamous Epithelial / LPF RARE  RARE   WBC, UA 0-2  <3 WBC/hpf   Bacteria, UA RARE  RARE   Casts HYALINE CASTS (*) NEGATIVE   Crystals TRIPLE PHOSPHATE CRYSTALS (*) NEGATIVE   Urine-Other AMORPHOUS URATES/PHOSPHATES    I-STAT TROPOININ, ED     Status: None   Collection Time    11/21/13 12:14 AM      Result Value Ref Range   Troponin i, poc 0.02  0.00 - 0.08 ng/mL   Comment 3            Comment: Due to the release kinetics of cTnI,     a negative result within the first hours     of the onset of symptoms does not rule out     myocardial infarction with certainty.     If myocardial infarction is still suspected,     repeat the test at appropriate intervals.    Ct Abdomen Pelvis Wo Contrast  11/21/2013   CLINICAL DATA:  pain  EXAM: CT CHEST, ABDOMEN AND PELVIS WITHOUT CONTRAST  TECHNIQUE: Multidetector CT imaging of the chest, abdomen and pelvis was performed following the standard protocol without IV contrast.  COMPARISON:  None.  FINDINGS: CT CHEST FINDINGS  Tiny bilateral pleural effusions. Coarse aortic calcifications without aneurysm. No mediastinal hematoma. Dependent atelectasis posteriorly in both lower lobes. Lungs are otherwise clear. Compression fracture deformity of T10 with approximately 20% loss of height, no retropulsion, which was not evident on prior films of 02/26/2013.  Small spurs at multiple levels throughout the thoracic  spine. Sternum intact.  CT ABDOMEN AND PELVIS FINDINGS  Several well demarcated low-attenuation liver lesions. The larger ones in hepatic segment 3 measure fluid attenuation and probably represent cysts. The smaller ones are nonspecific.  Probable cyst in the upper pole left kidney. No hydronephrosis. Spleen, adrenal glands, and pancreas unremarkable. Unenhanced CT was performed per clinician order. Lack of IV contrast limits sensitivity and specificity, especially for evaluation of abdominal/pelvic solid viscera. Patchy aortic calcifications without aneurysm. Stomach, small bowel, nondilated. There is mild gaseous distention of the proximal colon, decompressed distally without discrete transition zone. Scattered sigmoid diverticula without adjacent inflammatory/edematous change. There is moderate distention of the urinary bladder. Bilateral pelvic phleboliths. Pelvis and lumbar spine intact.  IMPRESSION: 1. Mild T10 vertebral compression fracture deformity, new since 02/26/2013. 2. Small bilateral pleural effusions with dependent atelectasis. 3. Probable hepatic and left renal cysts. 4. Sigmoid diverticulosis.   Electronically Signed   By: Arne Cleveland M.D.   On: 11/21/2013 00:29   Ct Chest Wo Contrast  11/21/2013   CLINICAL DATA:  pain  EXAM: CT CHEST, ABDOMEN AND PELVIS WITHOUT CONTRAST  TECHNIQUE: Multidetector CT imaging of the chest, abdomen and pelvis was performed following the standard protocol without IV contrast.  COMPARISON:  None.  FINDINGS: CT CHEST FINDINGS  Tiny bilateral pleural effusions. Coarse aortic calcifications without aneurysm. No mediastinal hematoma. Dependent atelectasis posteriorly in both lower lobes. Lungs are otherwise clear. Compression fracture deformity of T10 with approximately 20% loss of height, no retropulsion, which was not evident on prior films of 02/26/2013. Small spurs at multiple levels throughout the thoracic  spine. Sternum intact.  CT ABDOMEN AND PELVIS FINDINGS  Several well demarcated low-attenuation liver lesions. The larger ones in hepatic segment 3 measure fluid attenuation and probably represent cysts. The smaller ones are nonspecific.  Probable cyst in the upper pole left kidney. No hydronephrosis. Spleen, adrenal glands, and pancreas unremarkable. Unenhanced CT was performed per clinician order. Lack of IV contrast limits sensitivity and specificity, especially for evaluation of abdominal/pelvic solid viscera. Patchy aortic calcifications without aneurysm. Stomach, small bowel, nondilated. There is mild gaseous distention of the proximal colon, decompressed distally without discrete transition zone. Scattered sigmoid diverticula without adjacent inflammatory/edematous change. There is moderate distention of the urinary bladder. Bilateral pelvic phleboliths. Pelvis and lumbar spine intact.  IMPRESSION: 1. Mild T10 vertebral compression fracture deformity, new since 02/26/2013. 2. Small bilateral pleural effusions with dependent atelectasis. 3. Probable hepatic and left renal cysts. 4. Sigmoid diverticulosis.   Electronically Signed   By: Arne Cleveland M.D.   On: 11/21/2013 00:29    Review of Systems  Constitutional: Positive for malaise/fatigue. Negative for fever and chills.  HENT: Negative for ear pain.   Eyes: Negative for double vision and photophobia.  Respiratory: Positive for shortness of breath. Negative for cough.   Cardiovascular: Positive for leg swelling. Negative for chest pain and palpitations.  Gastrointestinal: Positive for constipation. Negative for nausea and vomiting.  Genitourinary: Negative for dysuria and hematuria.  Musculoskeletal: Positive for back pain. Negative for myalgias.  Skin: Negative for rash.  Neurological: Positive for weakness. Negative for tingling, sensory change and headaches.  Endo/Heme/Allergies: Negative for polydipsia. Bruises/bleeds easily.    Psychiatric/Behavioral: Negative for suicidal ideas, hallucinations and substance abuse.   Blood pressure 160/108, pulse 116, temperature 99.4 F (37.4 C), temperature source Rectal, resp. rate 23, SpO2 99.00%. Physical Exam  Nursing note and vitals reviewed. Constitutional: She is oriented to person, place, and time. She appears well-developed and well-nourished. She  appears distressed.  Mildly uncomfortable   HENT:  Head: Normocephalic and atraumatic.  Nose: Nose normal.  Mouth/Throat: Oropharynx is clear and moist. No oropharyngeal exudate.  Eyes: Conjunctivae and EOM are normal. Pupils are equal, round, and reactive to light. No scleral icterus.  Neck: Normal range of motion. Neck supple. No JVD present. No tracheal deviation present. No thyromegaly present.  Cardiovascular: Normal heart sounds and intact distal pulses.  Exam reveals no gallop.   No murmur heard. Irregularly irregular  Respiratory: Effort normal and breath sounds normal. No respiratory distress. She has no wheezes. She has no rales.  GI: Soft. Bowel sounds are normal. She exhibits distension. There is no tenderness. There is no rebound.  Musculoskeletal: Normal range of motion. She exhibits edema.  Trace edema   Neurological: She is alert and oriented to person, place, and time. No cranial nerve deficit.  Skin: Skin is warm and dry. No rash noted. She is not diaphoretic. No erythema.  Psychiatric:  Flat affect    Labs reviewed; h/h 14.9/44, plt 210, na 136, K 3.9, bun/cr 12/0.46, glucose 138 ECGs reviewed; several look like sinus tachycardia and most recent looks like atrial fibrillation with RVR  Assessment/Plan: Ms. Bridney Guadarrama is an 80 yo woman with dyslipidemia, Parkinson's disease, hypertension, dementia who presented with back pain thought 2/2 vertebral fractures and went intro atrial fibrillation with RVR. Given blood pressure drop with metoprolol and pauses with diltiazem, I favor shying away from AV  nodal blockers. For now, will trial digoxin for rate control. If she becomes unstable hemodynamically, will need to consider DCCV. I discussed anticoagulation with her daughter and at this time she prefers to defer but she will speak with her brother as well. 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 Currently, the best strategy is improving her pain and moving her bowels. I hope that these triggers will decrease her atrial fibrillation burden. If we do pursue a rhythm control strategy later then she will need to be on anticoagulation for 4 weeks. Would use low dose apixaban for lower bleed risk in that case. 1. Atrial fibrillation with RVR - stepdown, telemetry - digoxin 0.125 mg x1 now given; no anticoagulation for now given high bleed/fall risk after discussion with daughter - treat underlying pain and drivers - update TSH, BNP, trend cardiac markers 2. Hypertension: volatile, watch closely, ok now 3. Constipation: per primary team 4. Thoracic vertebral fractures: pain control 5. Parkinson's: per primary team; consult neurology likely   Vanessa Mcbride 11/21/2013, 2:07 AM

## 2013-11-21 NOTE — Progress Notes (Signed)
[place] TEAM 1 - Stepdown/ICU TEAM Progress Note  Vanessa Mcbride IDP:[id number] DOB: 03/03/1925 DOA: 11/20/2013 PCP: Estill Dooms, MD  Admit HPI / Brief Narrative: Vanessa Mcbride is a 80 y.o. female  has a past medical history of Parkinson's disease; Hypertension; Arthritis; Scoliosis; Memory loss (06/09/2012); Aortic valve disorders (04/11/2012); Dementia in conditions classified elsewhere without behavioral disturbance (04/07/2012); Hyperlipidemia; Osteoarthrosis, unspecified whether generalized or localized, unspecified site; Insomnia, unspecified (03/20/2012); Fracture closed, humerus (03/20/2012); Other closed fractures of distal end of radius (alone) (03/20/2012); Transient ischemic attack (TIA), and cerebral infarction without residual deficits(V12.54); Unspecified constipation; Debility, unspecified (03/18/2012); Anxiety; Generalized anxiety disorder; and Urinary incontinence (07/05/2013).  Presented with  Patient was doing well up until Sunday morning August 30 when she developed back discomfort. Patient refused to go to church. Today her daughter who is a retired physician not dictated increased complaints of back pain. She was brought to emergency department at [facility] Patient had a CT scan of her abdomen and chest showing T. 10 compression fracture new since CT scan in 2014. She will also noted to have heavy stool burden as well. While being observed in the emergency department patient developed new onset of atrial fibrillation with RVR with heart rates as high as 170s. Patient was given 5 mg of metoprolol IV. Her previously elevated systolic blood pressure up to 190s had decreased to 105.Marland Kitchen She was given IV fluids and switch to diltiazem drip. Diltiazem drip was going at 5 when patient developed sinus bradycardia and pauses up to 6 seconds. She continued to have intermittent shorter pauses between 2-3 seconds despite diltiazem being discontinued. Patient went back into A. fib with RVR with heart  rates up to 140s Spoke to cardiology who recommends transfer to [facility] for further monitoring.  Of note  Family patient has been severely constipated with no bowel movements for the past 3-4 days. Recently has Sinemet has been increased due to her rigidity. Her neurologist warned of constipation is likely side effect.  Hospitalist was called for admission for a.fib w RVR   HPI/Subjective: 9/1 patient alert, follows commands, but movements and conversation consistent with advanced Parkinson's disease.   Assessment/Plan: Atrial fibrillation with RVR  -digoxin 0.125 mg x1 now given; no anticoagulation for now given high bleed/fall risk after discussion with daughter  - treat underlying pain and drivers  - TSH= within normal limit,  -BNP pending  -Troponins x3 negative   -Per family i.e. sister (retired physician) and brother do not desire anticoagulation  Sinus pause (Multiple pauses cardiac function) -All AV nodal blocking agents to include Cardizem, metoprolol DC'd. Patient continues to have4-6 second pauses (tachybradycardia syndrome?). -Spoke with cardiology and if patient continues to have pauses overnight will need to consider placement of a pacemaker. -Has received one dose of digoxin 0.125 mg x1. Cardiology has [place] any further digoxin  Hypertension:  -volatile, watch closely; patient on no medication to control BP at this time -Start hydralazine 5 mg PRN for SBP> 180 over DBP> 105  Constipation: -bisacodyl 5 mg BID   Thoracic vertebral fractures: (T 10) -pain control;  Toradol 15 mg TID -Limit narcotics as these can exacerbate patient's cognitive problems, and constipation -Neurosurgery has seen patient and recommends no aggressive intervention.   Advanced Parkinson's disease - per daughter neurologist (seen by Dr. Star Age, [facility]) -Patient's Sinemet was just increased per family; 50-200 QHS; 25-100 1 1/2 tab at 0600, 1000, 1400,  1800 -PT/OT consult   Constipation -MiraLAX  x1 -Bisacodyl 10 mg x1 -Bisacodyl 5 mg BID    Code Status: FULL Family Communication: Son and daughter present Disposition Plan: Per cardiology/neurosurgery   Consultants: Dr. Kristeen Miss (neurosurgery)   Procedure/Significant Events: 8./31 CT pelvis without contrast;Mild T10 vertebral compression fracture  - Small bilateral pleural effusions with dependent atelectasis.  -Probable hepatic and left renal cysts.  -Sigmoid diverticulosis.   Culture NA  Antibiotics: NA  DVT prophylaxis: Lovenox    LINES / TUBES:      Continuous Infusions:   Objective: VITAL SIGNS: Temp: 99.6 F (37.6 C) (09/01 0739) Temp src: Axillary (09/01 0739) BP: 113/80 mmHg (09/01 0700) Pulse Rate: 138 (09/01 0739) SPO2; FIO2:   Intake/Output Summary (Last 24 hours) at 11/21/13 0840 Last data filed at 11/21/13 0700  Gross per 24 hour  Intake    400 ml  Output      0 ml  Net    400 ml     Exam: General: A./O. x4, NAD, No acute respiratory distress Lungs: Clear to auscultation bilaterally without wheezes or crackles Cardiovascular: Regular rate and rhythm without murmur gallop or rub normal S1 and S2 Abdomen: Nontender, nondistended, soft, bowel sounds positive, no rebound, no ascites, no appreciable mass Extremities: No significant cyanosis, clubbing, or edema bilateral lower extremities Neurologic; cranial II-XII intact, delayed response to all commands for movement secondary to Parkinson's. Right paresthesia, left upper extremity strength 3/5, left lower extremity strength 4/5, sensation intact  Data Reviewed: Basic Metabolic Panel:  Recent Labs Lab 11/20/13 2001 11/21/13 0605  NA 136* 138  K 3.9 3.6*  CL 94* 99  CO2 29 27  GLUCOSE 138* 147*  BUN 12 12  CREATININE 0.46* 0.41*  CALCIUM 9.6 8.9  MG  --  2.5  PHOS  --  2.7   Liver Function Tests:  Recent Labs Lab 11/21/13 0605  AST 9  ALT <5  ALKPHOS 55   BILITOT 1.0  PROT 6.7  ALBUMIN 3.4*   No results found for this basename: LIPASE, AMYLASE,  in the last 168 hours No results found for this basename: AMMONIA,  in the last 168 hours CBC:  Recent Labs Lab 11/20/13 2001 11/21/13 0605  WBC 10.6* 10.4  HGB 14.9 14.4  HCT 44.4 43.5  MCV 94.9 96.5  PLT 210 212   Cardiac Enzymes:  Recent Labs Lab 11/21/13 0605  TROPONINI <0.30   BNP (last 3 results) No results found for this basename: PROBNP,  in the last 8760 hours CBG: No results found for this basename: GLUCAP,  in the last 168 hours  No results found for this or any previous visit (from the past 240 hour(s)).   Studies:  Recent x-ray studies have been reviewed in detail by the Attending Physician  Scheduled Meds:  Scheduled Meds: . sodium chloride   Intravenous STAT  . antiseptic oral rinse  7 mL Mouth Rinse BID  . aspirin EC  81 mg Oral Daily  . carbidopa-levodopa  1 tablet Oral QHS  . docusate sodium  100 mg Oral BID  . enoxaparin (LOVENOX) injection  40 mg Subcutaneous Q24H  . escitalopram  10 mg Oral Daily  . pantoprazole (PROTONIX) IV  40 mg Intravenous QHS   Or  . pantoprazole  40 mg Oral QHS  . senna  1 tablet Oral BID  . sodium chloride  3 mL Intravenous Q12H    Time spent on care of this patient: 40 mins   Naimah Yingst, J , MD   [facility] Office  [phone] Pager - [phone]  On-Call/Text Page:      [email or web address]      password [email or web address]  If 7PM-7AM, please contact night-coverage [email or web address] Password [email or web address] 11/21/2013, 8:40 AM   LOS: 1 day

## 2013-11-21 NOTE — Progress Notes (Signed)
Patient Name: Vanessa Mcbride Date of Encounter: 11/21/2013     Active Problems:   HTN (hypertension)   Parkinson disease   Atrial fibrillation with RVR   Compression fracture of thoracic vertebra   Obstipation    SUBJECTIVE  Does not speak much due to dementia. However does communicate with daughter, denies any CP  CURRENT MEDS . sodium chloride   Intravenous STAT  . antiseptic oral rinse  7 mL Mouth Rinse BID  . aspirin EC  81 mg Oral Daily  . carbidopa-levodopa  1 tablet Oral QHS  . docusate sodium  100 mg Oral BID  . enoxaparin (LOVENOX) injection  40 mg Subcutaneous Q24H  . escitalopram  10 mg Oral Daily  . pantoprazole (PROTONIX) IV  40 mg Intravenous QHS   Or  . pantoprazole  40 mg Oral QHS  . senna  1 tablet Oral BID  . sodium chloride  3 mL Intravenous Q12H    OBJECTIVE  Filed Vitals:   11/21/13 0600 11/21/13 0643 11/21/13 0700 11/21/13 0739  BP: 119/88  113/80   Pulse: 144 144 123 138  Temp:    99.6 F (37.6 C)  TempSrc:    Axillary  Resp: 19  19 17   Height:      Weight:      SpO2: 95%  97% 96%    Intake/Output Summary (Last 24 hours) at 11/21/13 0915 Last data filed at 11/21/13 0700  Gross per 24 hour  Intake    400 ml  Output      0 ml  Net    400 ml   Filed Weights   11/21/13 0427  Weight: 149 lb 0.5 oz (67.6 kg)    PHYSICAL EXAM  General: Pleasant, NAD. Neuro: Alert and oriented X 3. Moves all extremities spontaneously. Psych: Normal affect. HEENT:  Normal  Neck: Supple without bruits or JVD. Lungs:  Resp regular and unlabored, CTA. Heart: RRR no s3, s4, or murmurs. Abdomen: Soft, non-tender, non-distended, BS + x 4.  Extremities: No clubbing, cyanosis or edema. DP/PT/Radials 2+ and equal bilaterally.  Accessory Clinical Findings  CBC  Recent Labs  11/20/13 2001 11/21/13 0605  WBC 10.6* 10.4  HGB 14.9 14.4  HCT 44.4 43.5  MCV 94.9 96.5  PLT 210 400   Basic Metabolic Panel  Recent Labs  11/20/13 2001 11/21/13 0605    NA 136* 138  K 3.9 3.6*  CL 94* 99  CO2 29 27  GLUCOSE 138* 147*  BUN 12 12  CREATININE 0.46* 0.41*  CALCIUM 9.6 8.9  MG  --  2.5  PHOS  --  2.7   Liver Function Tests  Recent Labs  11/21/13 0605  AST 9  ALT <5  ALKPHOS 55  BILITOT 1.0  PROT 6.7  ALBUMIN 3.4*   Cardiac Enzymes  Recent Labs  11/21/13 0605  TROPONINI <0.30   Thyroid Function Tests  Recent Labs  11/21/13 0605  TSH 1.470    TELE A-fib with RVR HR 140s, several pauses ranging 3.8 sec to 4.0 sec. Spontaneous converted to NSR this am     ECG  A-fib with RVR with HR 150s    Radiology/Studies  Ct Abdomen Pelvis Wo Contrast  11/21/2013   CLINICAL DATA:  pain  EXAM: CT CHEST, ABDOMEN AND PELVIS WITHOUT CONTRAST  TECHNIQUE: Multidetector CT imaging of the chest, abdomen and pelvis was performed following the standard protocol without IV contrast.  COMPARISON:  None.  FINDINGS: CT CHEST FINDINGS  Tiny  bilateral pleural effusions. Coarse aortic calcifications without aneurysm. No mediastinal hematoma. Dependent atelectasis posteriorly in both lower lobes. Lungs are otherwise clear. Compression fracture deformity of T10 with approximately 20% loss of height, no retropulsion, which was not evident on prior films of 02/26/2013. Small spurs at multiple levels throughout the thoracic spine. Sternum intact.  CT ABDOMEN AND PELVIS FINDINGS  Several well demarcated low-attenuation liver lesions. The larger ones in hepatic segment 3 measure fluid attenuation and probably represent cysts. The smaller ones are nonspecific.  Probable cyst in the upper pole left kidney. No hydronephrosis. Spleen, adrenal glands, and pancreas unremarkable. Unenhanced CT was performed per clinician order. Lack of IV contrast limits sensitivity and specificity, especially for evaluation of abdominal/pelvic solid viscera. Patchy aortic calcifications without aneurysm. Stomach, small bowel, nondilated. There is mild gaseous distention of the  proximal colon, decompressed distally without discrete transition zone. Scattered sigmoid diverticula without adjacent inflammatory/edematous change. There is moderate distention of the urinary bladder. Bilateral pelvic phleboliths. Pelvis and lumbar spine intact.  IMPRESSION: 1. Mild T10 vertebral compression fracture deformity, new since 02/26/2013. 2. Small bilateral pleural effusions with dependent atelectasis. 3. Probable hepatic and left renal cysts. 4. Sigmoid diverticulosis.   Electronically Signed   By: Arne Cleveland M.D.   On: 11/21/2013 00:29   Ct Chest Wo Contrast  11/21/2013   CLINICAL DATA:  pain  EXAM: CT CHEST, ABDOMEN AND PELVIS WITHOUT CONTRAST  TECHNIQUE: Multidetector CT imaging of the chest, abdomen and pelvis was performed following the standard protocol without IV contrast.  COMPARISON:  None.  FINDINGS: CT CHEST FINDINGS  Tiny bilateral pleural effusions. Coarse aortic calcifications without aneurysm. No mediastinal hematoma. Dependent atelectasis posteriorly in both lower lobes. Lungs are otherwise clear. Compression fracture deformity of T10 with approximately 20% loss of height, no retropulsion, which was not evident on prior films of 02/26/2013. Small spurs at multiple levels throughout the thoracic spine. Sternum intact.  CT ABDOMEN AND PELVIS FINDINGS  Several well demarcated low-attenuation liver lesions. The larger ones in hepatic segment 3 measure fluid attenuation and probably represent cysts. The smaller ones are nonspecific.  Probable cyst in the upper pole left kidney. No hydronephrosis. Spleen, adrenal glands, and pancreas unremarkable. Unenhanced CT was performed per clinician order. Lack of IV contrast limits sensitivity and specificity, especially for evaluation of abdominal/pelvic solid viscera. Patchy aortic calcifications without aneurysm. Stomach, small bowel, nondilated. There is mild gaseous distention of the proximal colon, decompressed distally without discrete  transition zone. Scattered sigmoid diverticula without adjacent inflammatory/edematous change. There is moderate distention of the urinary bladder. Bilateral pelvic phleboliths. Pelvis and lumbar spine intact.  IMPRESSION: 1. Mild T10 vertebral compression fracture deformity, new since 02/26/2013. 2. Small bilateral pleural effusions with dependent atelectasis. 3. Probable hepatic and left renal cysts. 4. Sigmoid diverticulosis.   Electronically Signed   By: Arne Cleveland M.D.   On: 11/21/2013 00:29    ASSESSMENT AND PLAN  1. A-fib with RVR, spontaneous converted this morning   - received dilt gtt in ED, stopped due to 6 sec pause  - received single dose of digoxin 3 am today  - no AV blocking drug (definitely no diltiazem due to sinus pauses) any time soon given pauses, will start home 12.5mg  metoprolol tonight or tomorrow if no more sinus pauses (if continue to have pauses after metoprolol may need pacemaker placement  - may need high dose Lovenox  - ?if need systemic anticoagulation in this patient with severe parkinson's disease as risk of fall is  high  2. Sinus pauses: will allow washout period for Diltiazem and digoxin, has been on metoprolol 12.5mg  at home without any dizziness or presyncopal sx. Will hold off restart metoprolol until tonight or tomorrow am assume no more pauses, otherwise may need pacemaker.   3. New back pain: per IM team: discussed with quality of life with daughter and son this am 4. Hypertension 5. Dyslipidemia 6. TIA 7. Dementia 8. Parkinson's disease    Signed, Almyra Deforest PA-C Pager: [phone] As above, patient seen and examined. Patient admitted with back pain and noted to have paroxysmal atrial fibrillation. 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 When she is in atrial fibrillation her heart rate increases to as high as 160. Long discussion with her son today. Given her age, Parkinson's and dementia he would like to be  conservative if possible and I think this is appropriate. He would like to avoid pacemaker. We will avoid AV nodal blocking agents at this point. I suggested the possibility of amiodarone to help maintain sinus rhythm and thereby decrease the incidence of post termination pauses. This could certainly exacerbate bradycardia and she would need to be followed closely. He will discuss this with his sister and make a final decision. They do not want anticoagulation at this point. Continue aspirin Kirk Ruths

## 2013-11-21 NOTE — ED Notes (Signed)
[facility] arrived to transport patient.  

## 2013-11-21 NOTE — Consult Note (Signed)
Reason for Consult: T10 compression fracture Referring Physician: Dr. Duffy Bruce is an 80 y.o. female.  HPI: Patient is an 80 year old individual who noted the onset of back pain several days ago. She was found to have a T10 compression fracture that was not noted on previous x-rays performed last year. A CT scan of the abdomen and pelvis revealed this. The fracture shows no significant retropulsion of bone. It is approximately 20% compression of the vertebral body height. Her motor function has been intact she is complaining of severe pain. She has had severe constipation also because of some apparent adjustments made to her parkinsonian medications. She is seen for consultation regarding what to do with his fracture.  Past Medical History  Diagnosis Date  . Parkinson's disease   . Hypertension   . Arthritis     osteoarthritis  . Scoliosis   . Memory loss 06/09/2012  . Aortic valve disorders 04/11/2012  . Dementia in conditions classified elsewhere without behavioral disturbance 04/07/2012  . Hyperlipidemia   . Osteoarthrosis, unspecified whether generalized or localized, unspecified site   . Insomnia, unspecified 03/20/2012  . Fracture closed, humerus 03/20/2012  . Other closed fractures of distal end of radius (alone) 03/20/2012  . Transient ischemic attack (TIA), and cerebral infarction without residual deficits(V12.54)   . Unspecified constipation   . Debility, unspecified 03/18/2012  . Anxiety   . Generalized anxiety disorder   . Urinary incontinence 07/05/2013    Re: PD    Past Surgical History  Procedure Laterality Date  . Breast surgery      benign lumpectomy  . Cholecystectomy      Family History  Problem Relation Age of Onset  . Heart disease Son     CAD  . Diabetes Mother   . Diabetes Sister   . Hypertension Sister     Social History:  reports that she has never smoked. She has never used smokeless tobacco. She reports that she does not drink alcohol or  use illicit drugs.  Allergies:  Allergies  Allergen Reactions  . Iodine Anaphylaxis  . Iohexol      Desc: pt has had contrast in the past (years ago) and it causea anaphylaxis- per pt's daughter.  stephanie davis,rtrct, Onset Date: [id number]     Medications: Have not reviewed the patient's current medications  Results for orders placed during the hospital encounter of 11/20/13 (from the past 48 hour(s))  BASIC METABOLIC PANEL     Status: Abnormal   Collection Time    11/20/13  8:01 PM      Result Value Ref Range   Sodium 136 (*) 137 - 147 mEq/L   Potassium 3.9  3.7 - 5.3 mEq/L   Chloride 94 (*) 96 - 112 mEq/L   CO2 29  19 - 32 mEq/L   Glucose, Bld 138 (*) 70 - 99 mg/dL   BUN 12  6 - 23 mg/dL   Creatinine, Ser 0.46 (*) 0.50 - 1.10 mg/dL   Calcium 9.6  8.4 - 10.5 mg/dL   GFR calc non Af Amer 88 (*) >90 mL/min   GFR calc Af Amer >90  >90 mL/min   Comment: (NOTE)     The eGFR has been calculated using the CKD EPI equation.     This calculation has not been validated in all clinical situations.     eGFR's persistently <90 mL/min signify possible Chronic Kidney     Disease.   Anion gap 13  5 - 15  CBC     Status: Abnormal   Collection Time    11/20/13  8:01 PM      Result Value Ref Range   WBC 10.6 (*) 4.0 - 10.5 K/uL   RBC 4.68  3.87 - 5.11 MIL/uL   Hemoglobin 14.9  12.0 - 15.0 g/dL   HCT 44.4  36.0 - 46.0 %   MCV 94.9  78.0 - 100.0 fL   MCH 31.8  26.0 - 34.0 pg   MCHC 33.6  30.0 - 36.0 g/dL   RDW 12.3  11.5 - 15.5 %   Platelets 210  150 - 400 K/uL  URINALYSIS, ROUTINE W REFLEX MICROSCOPIC     Status: Abnormal   Collection Time    11/20/13  9:23 PM      Result Value Ref Range   Color, Urine YELLOW  YELLOW   APPearance TURBID (*) CLEAR   Specific Gravity, Urine 1.014  1.005 - 1.030   pH 8.0  5.0 - 8.0   Glucose, UA NEGATIVE  NEGATIVE mg/dL   Hgb urine dipstick NEGATIVE  NEGATIVE   Bilirubin Urine NEGATIVE  NEGATIVE   Ketones, ur NEGATIVE  NEGATIVE mg/dL   Protein,  ur 30 (*) NEGATIVE mg/dL   Urobilinogen, UA 0.2  0.0 - 1.0 mg/dL   Nitrite NEGATIVE  NEGATIVE   Leukocytes, UA NEGATIVE  NEGATIVE  URINE MICROSCOPIC-ADD ON     Status: Abnormal   Collection Time    11/20/13  9:23 PM      Result Value Ref Range   Squamous Epithelial / LPF RARE  RARE   WBC, UA 0-2  <3 WBC/hpf   Bacteria, UA RARE  RARE   Casts HYALINE CASTS (*) NEGATIVE   Crystals TRIPLE PHOSPHATE CRYSTALS (*) NEGATIVE   Urine-Other AMORPHOUS URATES/PHOSPHATES    I-STAT TROPOININ, ED     Status: None   Collection Time    11/21/13 12:14 AM      Result Value Ref Range   Troponin i, poc 0.02  0.00 - 0.08 ng/mL   Comment 3            Comment: Due to the release kinetics of cTnI,     a negative result within the first hours     of the onset of symptoms does not rule out     myocardial infarction with certainty.     If myocardial infarction is still suspected,     repeat the test at appropriate intervals.  LACTIC ACID, PLASMA     Status: None   Collection Time    11/21/13  2:45 AM      Result Value Ref Range   Lactic Acid, Venous 1.2  0.5 - 2.2 mmol/L  MAGNESIUM     Status: None   Collection Time    11/21/13  6:05 AM      Result Value Ref Range   Magnesium 2.5  1.5 - 2.5 mg/dL  PHOSPHORUS     Status: None   Collection Time    11/21/13  6:05 AM      Result Value Ref Range   Phosphorus 2.7  2.3 - 4.6 mg/dL  TSH     Status: None   Collection Time    11/21/13  6:05 AM      Result Value Ref Range   TSH 1.470  0.350 - 4.500 uIU/mL  COMPREHENSIVE METABOLIC PANEL     Status: Abnormal   Collection Time    11/21/13  6:05 AM  Result Value Ref Range   Sodium 138  137 - 147 mEq/L   Potassium 3.6 (*) 3.7 - 5.3 mEq/L   Chloride 99  96 - 112 mEq/L   CO2 27  19 - 32 mEq/L   Glucose, Bld 147 (*) 70 - 99 mg/dL   BUN 12  6 - 23 mg/dL   Creatinine, Ser 0.41 (*) 0.50 - 1.10 mg/dL   Calcium 8.9  8.4 - 10.5 mg/dL   Total Protein 6.7  6.0 - 8.3 g/dL   Albumin 3.4 (*) 3.5 - 5.2 g/dL    AST 9  0 - 37 U/L   ALT <5  0 - 35 U/L   Alkaline Phosphatase 55  39 - 117 U/L   Total Bilirubin 1.0  0.3 - 1.2 mg/dL   GFR calc non Af Amer >90  >90 mL/min   GFR calc Af Amer >90  >90 mL/min   Comment: (NOTE)     The eGFR has been calculated using the CKD EPI equation.     This calculation has not been validated in all clinical situations.     eGFR's persistently <90 mL/min signify possible Chronic Kidney     Disease.   Anion gap 12  5 - 15  CBC     Status: None   Collection Time    11/21/13  6:05 AM      Result Value Ref Range   WBC 10.4  4.0 - 10.5 K/uL   RBC 4.51  3.87 - 5.11 MIL/uL   Hemoglobin 14.4  12.0 - 15.0 g/dL   HCT 43.5  36.0 - 46.0 %   MCV 96.5  78.0 - 100.0 fL   MCH 31.9  26.0 - 34.0 pg   MCHC 33.1  30.0 - 36.0 g/dL   RDW 12.3  11.5 - 15.5 %   Platelets 212  150 - 400 K/uL  TROPONIN I     Status: None   Collection Time    11/21/13  6:05 AM      Result Value Ref Range   Troponin I <0.30  <0.30 ng/mL   Comment:            Due to the release kinetics of cTnI,     a negative result within the first hours     of the onset of symptoms does not rule out     myocardial infarction with certainty.     If myocardial infarction is still suspected,     repeat the test at appropriate intervals.  MRSA PCR SCREENING     Status: None   Collection Time    11/21/13  8:00 AM      Result Value Ref Range   MRSA by PCR NEGATIVE  NEGATIVE   Comment:            The GeneXpert MRSA Assay (FDA     approved for NASAL specimens     only), is one component of a     comprehensive MRSA colonization     surveillance program. It is not     intended to diagnose MRSA     infection nor to guide or     monitor treatment for     MRSA infections.  TROPONIN I     Status: None   Collection Time    11/21/13 11:45 AM      Result Value Ref Range   Troponin I <0.30  <0.30 ng/mL   Comment:  Due to the release kinetics of cTnI,     a negative result within the first hours     of  the onset of symptoms does not rule out     myocardial infarction with certainty.     If myocardial infarction is still suspected,     repeat the test at appropriate intervals.    Ct Abdomen Pelvis Wo Contrast  11/21/2013   CLINICAL DATA:  pain  EXAM: CT CHEST, ABDOMEN AND PELVIS WITHOUT CONTRAST  TECHNIQUE: Multidetector CT imaging of the chest, abdomen and pelvis was performed following the standard protocol without IV contrast.  COMPARISON:  None.  FINDINGS: CT CHEST FINDINGS  Tiny bilateral pleural effusions. Coarse aortic calcifications without aneurysm. No mediastinal hematoma. Dependent atelectasis posteriorly in both lower lobes. Lungs are otherwise clear. Compression fracture deformity of T10 with approximately 20% loss of height, no retropulsion, which was not evident on prior films of 02/26/2013. Small spurs at multiple levels throughout the thoracic spine. Sternum intact.  CT ABDOMEN AND PELVIS FINDINGS  Several well demarcated low-attenuation liver lesions. The larger ones in hepatic segment 3 measure fluid attenuation and probably represent cysts. The smaller ones are nonspecific.  Probable cyst in the upper pole left kidney. No hydronephrosis. Spleen, adrenal glands, and pancreas unremarkable. Unenhanced CT was performed per clinician order. Lack of IV contrast limits sensitivity and specificity, especially for evaluation of abdominal/pelvic solid viscera. Patchy aortic calcifications without aneurysm. Stomach, small bowel, nondilated. There is mild gaseous distention of the proximal colon, decompressed distally without discrete transition zone. Scattered sigmoid diverticula without adjacent inflammatory/edematous change. There is moderate distention of the urinary bladder. Bilateral pelvic phleboliths. Pelvis and lumbar spine intact.  IMPRESSION: 1. Mild T10 vertebral compression fracture deformity, new since 02/26/2013. 2. Small bilateral pleural effusions with dependent atelectasis. 3.  Probable hepatic and left renal cysts. 4. Sigmoid diverticulosis.   Electronically Signed   By: Arne Cleveland M.D.   On: 11/21/2013 00:29   Ct Chest Wo Contrast  11/21/2013   CLINICAL DATA:  pain  EXAM: CT CHEST, ABDOMEN AND PELVIS WITHOUT CONTRAST  TECHNIQUE: Multidetector CT imaging of the chest, abdomen and pelvis was performed following the standard protocol without IV contrast.  COMPARISON:  None.  FINDINGS: CT CHEST FINDINGS  Tiny bilateral pleural effusions. Coarse aortic calcifications without aneurysm. No mediastinal hematoma. Dependent atelectasis posteriorly in both lower lobes. Lungs are otherwise clear. Compression fracture deformity of T10 with approximately 20% loss of height, no retropulsion, which was not evident on prior films of 02/26/2013. Small spurs at multiple levels throughout the thoracic spine. Sternum intact.  CT ABDOMEN AND PELVIS FINDINGS  Several well demarcated low-attenuation liver lesions. The larger ones in hepatic segment 3 measure fluid attenuation and probably represent cysts. The smaller ones are nonspecific.  Probable cyst in the upper pole left kidney. No hydronephrosis. Spleen, adrenal glands, and pancreas unremarkable. Unenhanced CT was performed per clinician order. Lack of IV contrast limits sensitivity and specificity, especially for evaluation of abdominal/pelvic solid viscera. Patchy aortic calcifications without aneurysm. Stomach, small bowel, nondilated. There is mild gaseous distention of the proximal colon, decompressed distally without discrete transition zone. Scattered sigmoid diverticula without adjacent inflammatory/edematous change. There is moderate distention of the urinary bladder. Bilateral pelvic phleboliths. Pelvis and lumbar spine intact.  IMPRESSION: 1. Mild T10 vertebral compression fracture deformity, new since 02/26/2013. 2. Small bilateral pleural effusions with dependent atelectasis. 3. Probable hepatic and left renal cysts. 4. Sigmoid  diverticulosis.   Electronically Signed  By: Arne Cleveland M.D.   On: 11/21/2013 00:29    Review of Systems  Constitutional:       Advanced Parkinsonism with dementia  HENT: Negative.   Respiratory: Negative.   Cardiovascular:       Recent afib with rvr  Gastrointestinal:       Severe constipation  Genitourinary: Negative.   Musculoskeletal: Positive for back pain.  Skin: Negative.   Neurological: Negative.   Endo/Heme/Allergies: Negative.   Psychiatric/Behavioral: Negative.    Blood pressure 155/73, pulse 80, temperature 99.2 F (37.3 C), temperature source Axillary, resp. rate 17, height 5' 6"  (1.676 m), weight 67.6 kg (149 lb 0.5 oz), SpO2 96.00%. Physical Exam  Constitutional:  Elderly somnolent lethargic lady in no distress  HENT:  Head: Normocephalic and atraumatic.  Eyes: Conjunctivae and EOM are normal. Pupils are equal, round, and reactive to light.  Neck: Normal range of motion. Neck supple.  Musculoskeletal:  Rigidity of all 4 extremities  Neurological:  Arouses to voice follows some simple commands.  Skin: Skin is warm and dry.  Psychiatric: She has a normal mood and affect. Her behavior is normal. Judgment and thought content normal.    Assessment/Plan: T10 compression fracture of about 20% of the vertebral body. The canal is widely patent. The patient has comorbidities of severe constipation which is made worse by treatment for parkinsonism. The patient also has dementia.  Because of her generally poor performance status on the team none surgical intervention for this individual. I do note that treatment with the narcotic analgesics is only to worsen her constipation. Oh suggest a trial of Toradol for pain medication to see if this helps to limit any narcotic medication and she may require. I do not feel that bracing is likely to help with management of this patient and her fracture. She may be mobilized as best tolerated. 1 give some consideration to a thin  acrylic balloon kyphoplasty for pain management purposes but I believe the first attempt to be most conservative with limitation of narcotic medications alteration of some of her antiparkinsonian medications and some simple watchful treatment.  Javana Schey J 11/21/2013, 3:15 PM

## 2013-11-21 NOTE — Progress Notes (Signed)
Echocardiogram 2D Echocardiogram has been performed.  Joelene Millin 11/21/2013, 9:39 AM

## 2013-11-21 NOTE — ED Notes (Signed)
MD Eulis Foster made aware of patient BP. He reports to give a 250NS Bolus and monitor BP. If BP drops any lower to stop BP.

## 2013-11-21 NOTE — ED Notes (Signed)
Hospitalist at bedside 

## 2013-11-21 NOTE — ED Notes (Signed)
Pt alert and denies pain at this time.

## 2013-11-22 ENCOUNTER — Telehealth: Payer: Self-pay | Admitting: Neurology

## 2013-11-22 MED ORDER — CARBIDOPA-LEVODOPA 25-100 MG PO TABS
1.5000 | ORAL_TABLET | Freq: Four times a day (QID) | ORAL | Status: DC
  Administered 2013-11-23 (×3): 1.5 via ORAL
  Filled 2013-11-22 (×5): qty 1.5

## 2013-11-22 MED ORDER — POLYETHYLENE GLYCOL 3350 17 G PO PACK
17.0000 g | PACK | Freq: Two times a day (BID) | ORAL | Status: DC
  Administered 2013-11-22 – 2013-11-23 (×2): 17 g via ORAL
  Filled 2013-11-22 (×3): qty 1

## 2013-11-22 MED ORDER — SORBITOL 70 % SOLN
960.0000 mL | TOPICAL_OIL | Freq: Once | ORAL | Status: AC | PRN
  Filled 2013-11-22: qty 240

## 2013-11-22 MED ORDER — CARBIDOPA-LEVODOPA 25-100 MG PO TABS
1.5000 | ORAL_TABLET | Freq: Once | ORAL | Status: AC
  Administered 2013-11-22: 1.5 via ORAL
  Filled 2013-11-22: qty 1.5

## 2013-11-22 MED ORDER — POTASSIUM CHLORIDE CRYS ER 20 MEQ PO TBCR
40.0000 meq | EXTENDED_RELEASE_TABLET | Freq: Once | ORAL | Status: AC
  Administered 2013-11-22: 40 meq via ORAL
  Filled 2013-11-22: qty 2

## 2013-11-22 MED ORDER — FLEET ENEMA 7-19 GM/118ML RE ENEM
1.0000 | ENEMA | Freq: Once | RECTAL | Status: AC
  Administered 2013-11-22: 1 via RECTAL
  Filled 2013-11-22: qty 1

## 2013-11-22 MED ORDER — CALCITONIN (SALMON) 200 UNIT/ACT NA SOLN
1.0000 | Freq: Every day | NASAL | Status: DC
  Filled 2013-11-22: qty 3.7

## 2013-11-22 NOTE — Progress Notes (Signed)
OT Cancellation Note  Patient Details Name: Vanessa Mcbride MRN: [id number] DOB: 06-Nov-1924   Cancelled Treatment:    Reason Eval/Treat Not Completed: OT screened.  Pt current D/C plan is SNF. No apparent immediate acute care OT needs, therefore will defer OT to SNF. If OT eval is needed please call Acute Rehab Dept. at [phone] or text page OT at [phone].    Benito Mccreedy OTR/L [phone] 11/22/2013, 6:30 PM

## 2013-11-22 NOTE — Progress Notes (Signed)
Speech Language Pathology Treatment: Dysphagia  Patient Details Name: Vanessa Mcbride MRN: [id number] DOB: 09-22-1924 Today's Date: 11/22/2013 Time: 5364-6803 SLP Time Calculation (min): 10 min  Assessment / Plan / Recommendation Clinical Impression  Pt still in discomfort today due to constipation, compression fx.  Daughter present; assisted with breakfast.  Pt must eat with bed in reclined position due to back pain.  Continues to present with mild s/s of aspiration - coughing with liquids unless quantities are small. Max assist provided. Noted baseline mild dysphagia secondary to Parkinson's, now likely decompensated.  Recommend continue Dysphagia 1 diet, sips of thin liquid from spoon to maximize safety with POs pending return to baseline.     HPI HPI: 80 year old female with history of Parkinson disease presents with severe constipation and back pain likely secondary to new compression fracture; was found to have atrial fibrillation with RVR treated with diltiazem and developed sinus pauses up to 6 seconds;  transferred to [facility] for [facility] care. Per caregiver, pt has had mild swallowing deficits in the past, with coughing associated with straw use.  Pt was on a mechanical soft diet, thin liquids at [facility]; needed assistance with feeding.          SLP Plan  Continue with current plan of care    Recommendations Diet recommendations: Thin liquid;Dysphagia 1 (puree) Liquids provided via: Teaspoon Medication Administration: Whole meds with puree Supervision: Staff to assist with self feeding Compensations: Slow rate;Small sips/bites              Oral Care Recommendations: Oral care BID Follow up Recommendations:  (tba) Plan: Continue with current plan of care    Imoni Kohen L. Tivis Ringer, [place] CCC/SLP Pager [phone]      Juan Quam Laurice 11/22/2013, 4:15 PM

## 2013-11-22 NOTE — Progress Notes (Signed)
Nutrition Brief Note  Patient identified on the [place] Report.  Wt Readings from Last 15 Encounters:  11/21/13 149 lb 0.5 oz (67.6 kg)  11/07/13 151 lb (68.493 kg)  10/02/13 148 lb (67.132 kg)  07/20/13 143 lb (64.864 kg)  07/05/13 142 lb (64.411 kg)  04/25/13 134 lb (60.782 kg)  04/12/13 135 lb (61.236 kg)  04/03/13 133 lb (60.328 kg)  01/04/13 128 lb (58.06 kg)  12/23/12 127 lb (57.607 kg)  10/17/12 123 lb (55.792 kg)  09/21/12 122 lb (55.339 kg)  09/07/12 122 lb (55.339 kg)  07/20/12 118 lb (53.524 kg)  07/04/12 114 lb (51.71 kg)    Body mass index is 24.07 kg/(m^2). Patient meets criteria for normal weight based on current BMI.   No weight loss noted. PO intake is adequate, patient with a lot of family support to encourage eating. Current diet order is dysphagia 1 with thin liquids, patient is consuming 50-100% of meals at this time. Labs and medications reviewed.   No nutrition interventions warranted at this time. If nutrition issues arise, please consult RD.   Molli Barrows, RD, LDN, [place] Pager [phone] After Hours Pager [phone]

## 2013-11-22 NOTE — Evaluation (Addendum)
Physical Therapy Evaluation Patient Details Name: Vanessa Mcbride MRN: [id number] DOB: 22-May-1924 Today's Date: 11/22/2013   History of Present Illness  Patient is a pleasent 80 yo female with hx of PD, presenting now with back pain consistent with T10 compression fx. Patient also with new onset Afib while in ED.  Clinical Impression  Patient demonstrates deficits in functional mobility as indicated below. Will benefit from continued skilled PT to address deficits and maximize function. Will see as indicated and progress as tolerated. Patient was very active at [facility] prior to last week (daughter reports she won her bowling league).  Recommend ST SNF before transition back to [facility].    Follow Up Recommendations SNF;Supervision/Assistance - 24 hour    Equipment Recommendations  Other (comment) (TBD)    Recommendations for Other Services       Precautions / Restrictions Precautions Precautions: Back Precaution Booklet Issued: No Precaution Comments: educated on positioning for comfort with movement Restrictions Weight Bearing Restrictions: No      Mobility  Bed Mobility Overal bed mobility: Needs Assistance Bed Mobility: Rolling;Sidelying to Sit;Sit to Sidelying Rolling: Mod assist Sidelying to sit: Max assist     Sit to sidelying: Max assist General bed mobility comments: Assist to initiate movement, assist for positioning and elevation of trunk to upright  Transfers Overall transfer level: Needs assistance Equipment used: 2 person hand held assist Transfers: Sit to/from Stand Sit to Stand: Min assist         General transfer comment: Patient able to come to standing with minimal assist for stability, no evidence of increased pain with elevation. patient did require manual cues for returning to sitting positon with difficulty carryout out flexion of BLEs.  Ambulation/Gait Ambulation/Gait assistance: Mod assist;+2 physical assistance Ambulation Distance (Feet): 3  Feet Assistive device: 2 person hand held assist (with gait belt) Gait Pattern/deviations: Step-to pattern;Decreased weight shift to right;Decreased weight shift to left;Shuffle;Festinating;Narrow base of support     General Gait Details: stride initiated only after lateral weight shifts and tactile cues for initiation, difficulty and inability to perform backwards step  Stairs            Wheelchair Mobility    Modified Rankin (Stroke Patients Only)       Balance Overall balance assessment: Needs assistance Sitting-balance support: Feet supported Sitting balance-Leahy Scale: Poor (to fair) Sitting balance - Comments: patient initially required moderate assist to maintain EOB sitting position with hands resting on knees, cues for upright posture and increased time patient was able to self support for brief periods of time approx 30 seconds. Postural control: Posterior lean;Right lateral lean Standing balance support: Bilateral upper extremity supported;During functional activity Standing balance-Leahy Scale: Poor Standing balance comment: patient able to stand with minimal assist and supports legs against the bed, when assisted away from the bed and limiting suppot patient was able to stand self support for less than 5 seconds.                              Pertinent Vitals/Pain Pain Assessment: Faces Faces Pain Scale: Hurts a little bit Pain Location: back region    Home Living Family/patient expects to be discharged to:: Skilled nursing facility ([facility])                      Prior Function Level of Independence: Needs assistance         Comments: able to  walk independently. In the last week. She has required a walker or wheelchair, but has severe pain with ambulation     Hand Dominance   Dominant Hand: Right    Extremity/Trunk Assessment               Lower Extremity Assessment: Generalized weakness;RLE deficits/detail RLE  Deficits / Details: weakness upon assessment asymmetrical compared to LLE, patient daughter reports that she typically has R sided weakness at baseline.    Cervical / Trunk Assessment: Kyphotic (T11 fracture)  Communication   Communication: Prefers language other than English;Expressive difficulties (YS:HUOHFGBMSX disease)  Cognition Arousal/Alertness: Lethargic (but arousable) Behavior During Therapy: WFL for tasks assessed/performed Overall Cognitive Status: Difficult to assess Area of Impairment: Attention;Following commands;Problem solving   Current Attention Level: Focused   Following Commands: Follows one step commands consistently;Follows one step commands with increased time     Problem Solving: Slow processing;Decreased initiation;Requires verbal cues;Requires tactile cues General Comments: patient has advanced PD    General Comments General comments (skin integrity, edema, etc.): patient with incontinence upon standing, bathing and hygiene performed with some active participation and repositioning by patient.     Exercises        Assessment/Plan    PT Assessment Patient needs continued PT services  PT Diagnosis Difficulty walking;Abnormality of gait;Generalized weakness;Acute pain   PT Problem List Decreased strength;Decreased range of motion;Decreased activity tolerance;Decreased balance;Decreased mobility;Decreased coordination;Pain  PT Treatment Interventions DME instruction;Gait training;Stair training;Functional mobility training;Therapeutic activities;Therapeutic exercise;Balance training;Patient/family education   PT Goals (Current goals can be found in the Care Plan section) Acute Rehab PT Goals Patient Stated Goal: Daughter wishes for patient to be able to walk again and if not atleast maintain quality of life  PT Goal Formulation: With family Time For Goal Achievement: 12/06/13 Potential to Achieve Goals: Good    Frequency Min 3X/week   Barriers to  discharge        Co-evaluation               End of Session Equipment Utilized During Treatment: Gait belt;Oxygen Activity Tolerance: Patient tolerated treatment well;Patient limited by fatigue;Patient limited by lethargy Patient left: in bed;with call bell/phone within reach;with family/visitor present Nurse Communication: Mobility status         Time: 1155-2080 PT Time Calculation (min): 50 min   Charges:   PT Evaluation $Initial PT Evaluation Tier I: 1 Procedure PT Treatments $Therapeutic Activity: 23-37 mins $Self Care/Home Management: 8-22   PT G CodesDuncan Dull 11/22/2013, 6:12 PM Alben Deeds, [place] DPT  [phone]

## 2013-11-22 NOTE — Progress Notes (Signed)
Administered fleet enema, patient tolerated well

## 2013-11-22 NOTE — Progress Notes (Signed)
Patient: Vanessa Mcbride / Admit Date: 11/20/2013 / Date of Encounter: 11/22/2013, 9:04 AM   Subjective: C/o low back pain.   Objective: Telemetry: NSR, rare PAC Physical Exam: Blood pressure 153/74, pulse 69, temperature 98.2 F (36.8 C), temperature source Oral, resp. rate 12, height 5\' 6"  (1.676 m), weight 149 lb 0.5 oz (67.6 kg), SpO2 96.00%. General: Well developed, well nourished WF Head: Normocephalic, atraumatic, sclera non-icteric, no xanthomas, nares are without discharge. Neck: JVP not elevated. Lungs: Clear bilaterally to auscultation without wheezes, rales, or rhonchi. Breathing is unlabored. Heart: RRR S1 S2 without murmurs, rubs, or gallops.  Abdomen: Soft, non-tender, non-distended with normoactive bowel sounds. No rebound/guarding. Extremities: No clubbing or cyanosis. No edema. Distal pedal pulses are 2+ and equal bilaterally. Neuro: Responds slowly to some questions, says "Yes" to others, unable to answer where she is  Intake/Output Summary (Last 24 hours) at 11/22/13 0904 Last data filed at 11/22/13 0600  Gross per 24 hour  Intake   1400 ml  Output      0 ml  Net   1400 ml    Inpatient Medications:  . antiseptic oral rinse  7 mL Mouth Rinse BID  . aspirin EC  81 mg Oral Daily  . carbidopa-levodopa  1 tablet Oral QHS  . carbidopa-levodopa  1.5 tablet Oral TID  . docusate sodium  100 mg Oral BID  . enoxaparin (LOVENOX) injection  40 mg Subcutaneous Q24H  . escitalopram  10 mg Oral Daily  . ketorolac  15 mg Intravenous 3 times per day  . pantoprazole (PROTONIX) IV  40 mg Intravenous QHS   Or  . pantoprazole  40 mg Oral QHS  . polyethylene glycol  17 g Oral Daily  . senna  1 tablet Oral BID  . sodium chloride  3 mL Intravenous Q12H   Infusions:    Labs:  Recent Labs  11/20/13 2001 11/21/13 0605  NA 136* 138  K 3.9 3.6*  CL 94* 99  CO2 29 27  GLUCOSE 138* 147*  BUN 12 12  CREATININE 0.46* 0.41*  CALCIUM 9.6 8.9  MG  --  2.5  PHOS  --  2.7     Recent Labs  11/21/13 0605  AST 9  ALT <5  ALKPHOS 55  BILITOT 1.0  PROT 6.7  ALBUMIN 3.4*    Recent Labs  11/20/13 2001 11/21/13 0605  WBC 10.6* 10.4  HGB 14.9 14.4  HCT 44.4 43.5  MCV 94.9 96.5  PLT 210 212    Recent Labs  11/21/13 0605 11/21/13 1145 11/21/13 1816  TROPONINI <0.30 <0.30 <0.30    Radiology/Studies:  Ct Abdomen Pelvis Wo Contrast 11/21/2013   CLINICAL DATA:  pain  EXAM: CT CHEST, ABDOMEN AND PELVIS WITHOUT CONTRAST  TECHNIQUE: Multidetector CT imaging of the chest, abdomen and pelvis was performed following the standard protocol without IV contrast.  COMPARISON:  None.  FINDINGS: CT CHEST FINDINGS  Tiny bilateral pleural effusions. Coarse aortic calcifications without aneurysm. No mediastinal hematoma. Dependent atelectasis posteriorly in both lower lobes. Lungs are otherwise clear. Compression fracture deformity of T10 with approximately 20% loss of height, no retropulsion, which was not evident on prior films of 02/26/2013. Small spurs at multiple levels throughout the thoracic spine. Sternum intact.  CT ABDOMEN AND PELVIS FINDINGS  Several well demarcated low-attenuation liver lesions. The larger ones in hepatic segment 3 measure fluid attenuation and probably represent cysts. The smaller ones are nonspecific.  Probable cyst in the upper pole left kidney. No  hydronephrosis. Spleen, adrenal glands, and pancreas unremarkable. Unenhanced CT was performed per clinician order. Lack of IV contrast limits sensitivity and specificity, especially for evaluation of abdominal/pelvic solid viscera. Patchy aortic calcifications without aneurysm. Stomach, small bowel, nondilated. There is mild gaseous distention of the proximal colon, decompressed distally without discrete transition zone. Scattered sigmoid diverticula without adjacent inflammatory/edematous change. There is moderate distention of the urinary bladder. Bilateral pelvic phleboliths. Pelvis and lumbar spine  intact.  IMPRESSION: 1. Mild T10 vertebral compression fracture deformity, new since 02/26/2013. 2. Small bilateral pleural effusions with dependent atelectasis. 3. Probable hepatic and left renal cysts. 4. Sigmoid diverticulosis.   Electronically Signed   By: Arne Cleveland M.D.   On: 11/21/2013 00:29   Ct Chest Wo Contrast  11/21/2013   CLINICAL DATA:  pain  EXAM: CT CHEST, ABDOMEN AND PELVIS WITHOUT CONTRAST  TECHNIQUE: Multidetector CT imaging of the chest, abdomen and pelvis was performed following the standard protocol without IV contrast.  COMPARISON:  None.  FINDINGS: CT CHEST FINDINGS  Tiny bilateral pleural effusions. Coarse aortic calcifications without aneurysm. No mediastinal hematoma. Dependent atelectasis posteriorly in both lower lobes. Lungs are otherwise clear. Compression fracture deformity of T10 with approximately 20% loss of height, no retropulsion, which was not evident on prior films of 02/26/2013. Small spurs at multiple levels throughout the thoracic spine. Sternum intact.  CT ABDOMEN AND PELVIS FINDINGS  Several well demarcated low-attenuation liver lesions. The larger ones in hepatic segment 3 measure fluid attenuation and probably represent cysts. The smaller ones are nonspecific.  Probable cyst in the upper pole left kidney. No hydronephrosis. Spleen, adrenal glands, and pancreas unremarkable. Unenhanced CT was performed per clinician order. Lack of IV contrast limits sensitivity and specificity, especially for evaluation of abdominal/pelvic solid viscera. Patchy aortic calcifications without aneurysm. Stomach, small bowel, nondilated. There is mild gaseous distention of the proximal colon, decompressed distally without discrete transition zone. Scattered sigmoid diverticula without adjacent inflammatory/edematous change. There is moderate distention of the urinary bladder. Bilateral pelvic phleboliths. Pelvis and lumbar spine intact.  IMPRESSION: 1. Mild T10 vertebral compression  fracture deformity, new since 02/26/2013. 2. Small bilateral pleural effusions with dependent atelectasis. 3. Probable hepatic and left renal cysts. 4. Sigmoid diverticulosis.   Electronically Signed   By: Arne Cleveland M.D.   On: 11/21/2013 00:29   2D Echo 11/21/13 - LVEF 50-55%, normal wall thickness, diastolic dysfunction, indeterminate LV filling pressure. Moderate LAE (23mm), mild aortic sclerosis.    Assessment and Plan  1. Newly recognized AF with likely component of tachy-brady syndrome (6sec pause on 9/1 at 1:20am after conversion to NSR)  - no AV blocking drug on board right now due to the pauses; HR 160s when in AF - given age, Parkinson's, dementia, family would like to be as conservative as possible and we think this is appropriate - son would like to avoid pacemaker and anticoagulation - possibility of amiodarone discussed to maintain NSR and decrease incidence of post-termination pauses but could certainly exacerbate bradycardia - family believes that back pain/constipation increased tendency for arrhythmia and want to hold off on this for now. They are aware with age and LAE that AF may recur; will observe for recurrences. 2. New back pain with T10 compression fx - being seen by neurosurgery. 3. Hypertension, volatile - hold off on resuming any agents in case we need BP for #1. 4. Dyslipidemia  5. H/o TIA 6. Significant Parkinson's disease/reported dementia per primary notes, with recent constipation - family says patient is  more limited by Parkinson's than dementia. They are eager for patient to start laxatives. 7. Mild hypokalemia 11/21/13 - replete and recheck in AM.  Signed, Dayna Dunn PA-C As above patient seen and examined; she has dementia with limited appropriate response; she remains in sinus this AM; family has elected conservative measures given age, dementia and multiple medical problems. No pacemaker or AV nodal blocking agents.  Kirk Ruths

## 2013-11-22 NOTE — Telephone Encounter (Signed)
Patient's daughter Wilmina Maxham @ [phone], calling to inform Dr. Rexene Alberts patient admitted to [facility] at step down unit 3S16.  FYI

## 2013-11-22 NOTE — Progress Notes (Signed)
[place] TEAM 1 - Stepdown/ICU TEAM Progress Note  Vanessa Mcbride FKC:[id number] DOB: 1925-01-21 DOA: 11/20/2013 PCP: Estill Dooms, MD  Admit HPI / Brief Narrative: 80 yo female w/ a history of Parkinson's disease; Hypertension; Arthritis; Scoliosis; Aortic valve disorder; Dementia; Hyperlipidemia; Osteoarthrosis; Insomnia; Transient ischemic attack; chronic constipation; Generalized anxiety disorder; and Urinary incontinence who presented with the acute onset of back discomfort.   She was taken to the ED at [facility] where a CT scan noted a T 10 compression fracture new since a CT scan in 2014. She will also noted to have a heavy stool burden. While being observed in the emergency department patient developed new onset of atrial fibrillation with RVR with heart rates as high as 170s. She was given IV fluids and a diltiazem drip, after which she developed sinus bradycardia and pauses up to 6 seconds.   HPI/Subjective: Pt is alert, but communication is limited due to her Parkinison's.  Her daughter is at the bedside and provides the hx.  She relates that her mother is doing well, with her current complaints being that of back pain related to the known compression fracture, as well as severe constipation which has not responded to the current tx plan.    Assessment/Plan:  Atrial fibrillation with RVR / Tachy-brady syndrome -ongoing care being directed by Cardiology - family desires to avoid anticoag   Thoracic #10 vertebral compression fractures -Toradol - limiting narcotics due to constipation - Neurosurgery has seen patient and recommends no aggressive intervention - add calcitonin nasal spray for potential analgesic affect  Constipation -has not responded to oral tx - begin w/ Fleet enema - if no response, advance to SMOG enema   Advanced Parkinson's disease -cont usual home dose of sinemet - PT/OT - goal is to return to [facility] at SNF level of care   Hypertension -volatile, but  avoiding over aggressive tx in setting of tachy-brady and advanced age - pain likely contributing to spikes    Code Status: FULL Family Communication: spoke w/ daughter at bedside at length  Disposition Plan: SDU  Consultants: Dr. Kristeen Miss (Neurosurgery) Dr. Kirk Ruths - Cardiology   Antibiotics: NA  DVT prophylaxis: Lovenox  Objective: Blood pressure 167/79, pulse 81, temperature 99.1 F (37.3 C), temperature source Axillary, resp. rate 19, height 5\' 6"  (1.676 m), weight 67.6 kg (149 lb 0.5 oz), SpO2 94.00%.  Intake/Output Summary (Last 24 hours) at 11/22/13 1552 Last data filed at 11/22/13 1200  Gross per 24 hour  Intake   1700 ml  Output      0 ml  Net   1700 ml   Exam: General: No acute respiratory distress Lungs: Clear to auscultation bilaterally without wheezes or crackles Cardiovascular: Regular rate without murmur gallop or rub  Abdomen: Mildly tender diffusely, modestly distended, soft, bowel sounds positive, no rebound, no ascites, no appreciable mass Extremities: No significant cyanosis, clubbing, or edema bilateral lower extremities Neurologic: delayed response to all commands for movement secondary to Parkinson's.   Data Reviewed: Basic Metabolic Panel:  Recent Labs Lab 11/20/13 2001 11/21/13 0605  NA 136* 138  K 3.9 3.6*  CL 94* 99  CO2 29 27  GLUCOSE 138* 147*  BUN 12 12  CREATININE 0.46* 0.41*  CALCIUM 9.6 8.9  MG  --  2.5  PHOS  --  2.7   Liver Function Tests:  Recent Labs Lab 11/21/13 0605  AST 9  ALT <5  ALKPHOS 55  BILITOT 1.0  PROT 6.7  ALBUMIN 3.4*   No results found for this basename: LIPASE, AMYLASE,  in the last 168 hours No results found for this basename: AMMONIA,  in the last 168 hours CBC:  Recent Labs Lab 11/20/13 2001 11/21/13 0605  WBC 10.6* 10.4  HGB 14.9 14.4  HCT 44.4 43.5  MCV 94.9 96.5  PLT 210 212   Cardiac Enzymes:  Recent Labs Lab 11/21/13 0605 11/21/13 1145 11/21/13 1816  TROPONINI  <0.30 <0.30 <0.30    Recent Results (from the past 240 hour(s))  MRSA PCR SCREENING     Status: None   Collection Time    11/21/13  8:00 AM      Result Value Ref Range Status   MRSA by PCR NEGATIVE  NEGATIVE Final   Comment:            The GeneXpert MRSA Assay (FDA     approved for NASAL specimens     only), is one component of a     comprehensive MRSA colonization     surveillance program. It is not     intended to diagnose MRSA     infection nor to guide or     monitor treatment for     MRSA infections.    Studies:  Recent x-ray studies have been reviewed in detail by the Attending Physician  Scheduled Meds:  Scheduled Meds: . antiseptic oral rinse  7 mL Mouth Rinse BID  . aspirin EC  81 mg Oral Daily  . carbidopa-levodopa  1 tablet Oral QHS  . carbidopa-levodopa  1.5 tablet Oral TID  . docusate sodium  100 mg Oral BID  . enoxaparin (LOVENOX) injection  40 mg Subcutaneous Q24H  . escitalopram  10 mg Oral Daily  . ketorolac  15 mg Intravenous 3 times per day  . pantoprazole  40 mg Oral QHS  . polyethylene glycol  17 g Oral Daily  . senna  1 tablet Oral BID  . sodium chloride  3 mL Intravenous Q12H    Time spent on care of this patient: 35 mins  Cherene Altes, MD [facility] For Consults/Admissions - Flow Manager - [phone] Office  [phone] Pager [phone]  On-Call/Text Page:      [email or web address]      password [facility]  11/22/2013, 3:52 PM   LOS: 2 days

## 2013-11-23 DIAGNOSIS — F411 Generalized anxiety disorder: Secondary | ICD-10-CM

## 2013-11-23 LAB — BASIC METABOLIC PANEL
ANION GAP: 10 (ref 5–15)
BUN: 17 mg/dL (ref 6–23)
CHLORIDE: 100 meq/L (ref 96–112)
CO2: 28 mEq/L (ref 19–32)
Calcium: 8.3 mg/dL — ABNORMAL LOW (ref 8.4–10.5)
Creatinine, Ser: 0.35 mg/dL — ABNORMAL LOW (ref 0.50–1.10)
GFR calc Af Amer: >90 mL/min (ref >90)
GFR calc non Af Amer: >90 mL/min (ref >90)
Glucose, Bld: 106 mg/dL — ABNORMAL HIGH (ref 70–99)
POTASSIUM: 4.1 meq/L (ref 3.7–5.3)
SODIUM: 138 meq/L (ref 137–147)

## 2013-11-23 MED ORDER — BISACODYL 5 MG PO TBEC: 5.0000 mg | DELAYED_RELEASE_TABLET | Freq: Two times a day (BID) | ORAL | Status: DC

## 2013-11-23 MED ORDER — HYDRALAZINE HCL 10 MG PO TABS
5.0000 mg | ORAL_TABLET | Freq: Four times a day (QID) | ORAL | Status: DC | PRN
  Filled 2013-11-23: qty 1

## 2013-11-23 MED ORDER — HYDRALAZINE HCL 10 MG PO TABS: 5.0000 mg | ORAL_TABLET | Freq: Four times a day (QID) | ORAL | Status: DC | PRN

## 2013-11-23 MED ORDER — KETOROLAC TROMETHAMINE 10 MG PO TABS
10.0000 mg | ORAL_TABLET | Freq: Three times a day (TID) | ORAL | Status: DC | PRN
  Filled 2013-11-23: qty 1

## 2013-11-23 MED ORDER — POLYETHYLENE GLYCOL 3350 17 G PO PACK: 17.0000 g | PACK | Freq: Two times a day (BID) | ORAL | Status: DC

## 2013-11-23 MED ORDER — KETOROLAC TROMETHAMINE 10 MG PO TABS: 10.0000 mg | ORAL_TABLET | Freq: Three times a day (TID) | ORAL | Status: DC | PRN

## 2013-11-23 NOTE — Progress Notes (Signed)
Report called to [place] RN at [facility].

## 2013-11-23 NOTE — Progress Notes (Signed)
Patient: Vanessa Mcbride / Admit Date: 11/20/2013 / Date of Encounter: 11/23/2013, 9:24 AM   Subjective: Poorly conversant; denies CP or dyspnea   Objective: Telemetry: NSR, rare PAC Physical Exam: Blood pressure 145/58, pulse 75, temperature 98.7 F (37.1 C), temperature source Axillary, resp. rate 21, height 5\' 6"  (1.676 m), weight 149 lb 0.5 oz (67.6 kg), SpO2 94.00%. General: Well developed, frail WF Head: Normal Neck: supple Lungs: Decreased BS bases Heart: RRR  Abdomen: Soft, non-tender, non-distended  Extremities: trace edema.  Neuro: Responds slowly to some questions, says "Yes" to others, unable to answer where she is  Intake/Output Summary (Last 24 hours) at 11/23/13 0924 Last data filed at 11/22/13 1200  Gross per 24 hour  Intake    240 ml  Output      0 ml  Net    240 ml    Inpatient Medications:  . antiseptic oral rinse  7 mL Mouth Rinse BID  . aspirin EC  81 mg Oral Daily  . calcitonin (salmon)  1 spray Alternating Nares Daily  . carbidopa-levodopa  1 tablet Oral QHS  . carbidopa-levodopa  1.5 tablet Oral QID  . docusate sodium  100 mg Oral BID  . enoxaparin (LOVENOX) injection  40 mg Subcutaneous Q24H  . escitalopram  10 mg Oral Daily  . ketorolac  15 mg Intravenous 3 times per day  . polyethylene glycol  17 g Oral BID  . senna  1 tablet Oral BID  . sodium chloride  3 mL Intravenous Q12H   Infusions:    Labs:  Recent Labs  11/20/13 2001 11/21/13 0605 11/23/13 0249  NA 136* 138 138  K 3.9 3.6* 4.1  CL 94* 99 100  CO2 29 27 28   GLUCOSE 138* 147* 106*  BUN 12 12 17   CREATININE 0.46* 0.41* 0.35*  CALCIUM 9.6 8.9 8.3*  MG  --  2.5  --   PHOS  --  2.7  --     Recent Labs  11/21/13 0605  AST 9  ALT <5  ALKPHOS 55  BILITOT 1.0  PROT 6.7  ALBUMIN 3.4*    Recent Labs  11/20/13 2001 11/21/13 0605  WBC 10.6* 10.4  HGB 14.9 14.4  HCT 44.4 43.5  MCV 94.9 96.5  PLT 210 212    Recent Labs  11/21/13 0605 11/21/13 1145 11/21/13 1816    TROPONINI <0.30 <0.30 <0.30    Radiology/Studies:  Ct Abdomen Pelvis Wo Contrast 11/21/2013   CLINICAL DATA:  pain  EXAM: CT CHEST, ABDOMEN AND PELVIS WITHOUT CONTRAST  TECHNIQUE: Multidetector CT imaging of the chest, abdomen and pelvis was performed following the standard protocol without IV contrast.  COMPARISON:  None.  FINDINGS: CT CHEST FINDINGS  Tiny bilateral pleural effusions. Coarse aortic calcifications without aneurysm. No mediastinal hematoma. Dependent atelectasis posteriorly in both lower lobes. Lungs are otherwise clear. Compression fracture deformity of T10 with approximately 20% loss of height, no retropulsion, which was not evident on prior films of 02/26/2013. Small spurs at multiple levels throughout the thoracic spine. Sternum intact.  CT ABDOMEN AND PELVIS FINDINGS  Several well demarcated low-attenuation liver lesions. The larger ones in hepatic segment 3 measure fluid attenuation and probably represent cysts. The smaller ones are nonspecific.  Probable cyst in the upper pole left kidney. No hydronephrosis. Spleen, adrenal glands, and pancreas unremarkable. Unenhanced CT was performed per clinician order. Lack of IV contrast limits sensitivity and specificity, especially for evaluation of abdominal/pelvic solid viscera. Patchy aortic calcifications without aneurysm.  Stomach, small bowel, nondilated. There is mild gaseous distention of the proximal colon, decompressed distally without discrete transition zone. Scattered sigmoid diverticula without adjacent inflammatory/edematous change. There is moderate distention of the urinary bladder. Bilateral pelvic phleboliths. Pelvis and lumbar spine intact.  IMPRESSION: 1. Mild T10 vertebral compression fracture deformity, new since 02/26/2013. 2. Small bilateral pleural effusions with dependent atelectasis. 3. Probable hepatic and left renal cysts. 4. Sigmoid diverticulosis.   Electronically Signed   By: Arne Cleveland M.D.   On: 11/21/2013  00:29   Ct Chest Wo Contrast  11/21/2013   CLINICAL DATA:  pain  EXAM: CT CHEST, ABDOMEN AND PELVIS WITHOUT CONTRAST  TECHNIQUE: Multidetector CT imaging of the chest, abdomen and pelvis was performed following the standard protocol without IV contrast.  COMPARISON:  None.  FINDINGS: CT CHEST FINDINGS  Tiny bilateral pleural effusions. Coarse aortic calcifications without aneurysm. No mediastinal hematoma. Dependent atelectasis posteriorly in both lower lobes. Lungs are otherwise clear. Compression fracture deformity of T10 with approximately 20% loss of height, no retropulsion, which was not evident on prior films of 02/26/2013. Small spurs at multiple levels throughout the thoracic spine. Sternum intact.  CT ABDOMEN AND PELVIS FINDINGS  Several well demarcated low-attenuation liver lesions. The larger ones in hepatic segment 3 measure fluid attenuation and probably represent cysts. The smaller ones are nonspecific.  Probable cyst in the upper pole left kidney. No hydronephrosis. Spleen, adrenal glands, and pancreas unremarkable. Unenhanced CT was performed per clinician order. Lack of IV contrast limits sensitivity and specificity, especially for evaluation of abdominal/pelvic solid viscera. Patchy aortic calcifications without aneurysm. Stomach, small bowel, nondilated. There is mild gaseous distention of the proximal colon, decompressed distally without discrete transition zone. Scattered sigmoid diverticula without adjacent inflammatory/edematous change. There is moderate distention of the urinary bladder. Bilateral pelvic phleboliths. Pelvis and lumbar spine intact.  IMPRESSION: 1. Mild T10 vertebral compression fracture deformity, new since 02/26/2013. 2. Small bilateral pleural effusions with dependent atelectasis. 3. Probable hepatic and left renal cysts. 4. Sigmoid diverticulosis.   Electronically Signed   By: Arne Cleveland M.D.   On: 11/21/2013 00:29   2D Echo 11/21/13 - LVEF 50-55%, normal wall  thickness, diastolic dysfunction, indeterminate LV filling pressure. Moderate LAE (34mm), mild aortic sclerosis.    Assessment and Plan  1. Newly recognized AF with likely component of tachy-brady syndrome (6sec pause on 9/1 at 1:20am after conversion to NSR)  - no AV blocking drug given post termination pauses - given age, Parkinson's, dementia, family would like to be as conservative as possible and I think this is appropriate - son would like to avoid pacemaker and anticoagulation - possibility of amiodarone discussed to maintain NSR and decrease incidence of post-termination pauses but could certainly exacerbate bradycardia - family believes that back pain/constipation increased tendency for arrhythmia and want to hold off on this for now. Remains in sinus for past 48 hrs 2. New back pain with T10 compression fx - being seen by neurosurgery. 3. Hypertension 4. Dyslipidemia  5. H/o TIA 6. Significant Parkinson's disease/reported dementia per primary notes - family says patient is more limited by Parkinson's than dementia.   Signed,  Kirk Ruths

## 2013-11-23 NOTE — Progress Notes (Signed)
Physical Therapy Treatment Patient Details Name: Vanessa Mcbride MRN: [id number] DOB: Jan 05, 1925 Today's Date: 11/23/2013    History of Present Illness Patient is a pleasent 80 yo female with hx of PD, presenting now with back pain consistent with T10 compression fx. Patient also with new onset Afib while in ED.    PT Comments    Patient demonstrates improved activity tolerance today compared to prior session. Patient demonstrates some ambulation with assist (+2) and modest improvements in balance this session. Patient does report pain in back region with activity. Spoke with patient and more so caregivers regarding techniques for safe positioning and improvements in mobility and function. At this time, feel patient would benefit greatly from continued skilled PT services. Patient for dc to SNF today.    Follow Up Recommendations  SNF;Supervision/Assistance - 24 hour     Equipment Recommendations  Other (comment) (TBD)    Recommendations for Other Services       Precautions / Restrictions Precautions Precautions: Back Precaution Booklet Issued: No Precaution Comments: educated on positioning for comfort with movement Restrictions Weight Bearing Restrictions: No    Mobility  Bed Mobility Overal bed mobility: Needs Assistance Bed Mobility: Rolling;Sidelying to Sit;Sit to Sidelying Rolling: Mod assist Sidelying to sit: Max assist     Sit to sidelying: Max assist General bed mobility comments: Assist to initiate movement, assist for positioning and elevation of trunk to upright  Transfers Overall transfer level: Needs assistance Equipment used: 2 person hand held assist Transfers: Sit to/from [facility] Sit to Stand: Min assist Stand pivot transfers: Min assist;+2 physical assistance       General transfer comment: Patient transferred to bedside commode, then assisted with transfer <> standing, manual cues to initiate flexion process to come to sitting.    Ambulation/Gait Ambulation/Gait assistance: Mod assist;+2 physical assistance Ambulation Distance (Feet): 18 Feet Assistive device: 2 person hand held assist (with gait belt) Gait Pattern/deviations: Step-to pattern;Decreased weight shift to right;Decreased weight shift to left;Shuffle;Festinating;Narrow base of support Gait velocity: decreased Gait velocity interpretation: <1.8 ft/sec, indicative of risk for recurrent falls General Gait Details: Multimodal cues for gait, stride initiated only after lateral weight shifts and tactile cues for initiation, difficulty and inability to control positioning without assist   Stairs            Wheelchair Mobility    Modified Rankin (Stroke Patients Only)       Balance Overall balance assessment: Needs assistance   Sitting balance-Leahy Scale: Poor (to fair)   Postural control: Posterior lean Standing balance support: Bilateral upper extremity supported Standing balance-Leahy Scale: Poor                      Cognition Arousal/Alertness: Lethargic (but arousable) Behavior During Therapy: WFL for tasks assessed/performed Overall Cognitive Status: Difficult to assess Area of Impairment: Attention;Following commands;Problem solving   Current Attention Level: Focused   Following Commands: Follows one step commands consistently;Follows one step commands with increased time     Problem Solving: Slow processing;Decreased initiation;Requires verbal cues;Requires tactile cues General Comments: patient has advanced PD    Exercises      General Comments        Pertinent Vitals/Pain Pain Assessment: Faces Faces Pain Scale: Hurts a little bit Pain Location: back Pain Intervention(s): Limited activity within patient's tolerance;Monitored during session;Repositioned;Relaxation    Home Living Family/patient expects to be discharged to:: Skilled nursing facility ([facility])  Prior Function  Level of Independence: Needs assistance      Comments: able to walk independently. In the last week. She has required a walker or wheelchair, but has severe pain with ambulation   PT Goals (current goals can now be found in the care plan section) Acute Rehab PT Goals Patient Stated Goal: Daughter wishes for patient to be able to walk again and if not atleast maintain quality of life  PT Goal Formulation: With family Time For Goal Achievement: 12/06/13 Potential to Achieve Goals: Good Progress towards PT goals: Progressing toward goals    Frequency  Min 3X/week    PT Plan Current plan remains appropriate    Co-evaluation             End of Session Equipment Utilized During Treatment: Gait belt;Oxygen Activity Tolerance: Patient tolerated treatment well;Patient limited by fatigue;Patient limited by lethargy Patient left: in bed;with call bell/phone within reach;with family/visitor present     Time: 1275-1700 PT Time Calculation (min): 36 min  Charges:  $Gait Training: 8-22 mins $Therapeutic Activity: 8-22 mins                    G CodesDuncan Dull 12/02/2013, 3:31 PM Alben Deeds, [place] DPT  [phone]

## 2013-11-23 NOTE — Clinical Social Work Note (Signed)
Patient for d/c today to SNF bed at [facility]. Daughter here and agreeable to plans-  plan transfer via EMS. Eduard Clos, MSW, [place]

## 2013-11-23 NOTE — Progress Notes (Signed)
Pt discharged via ambulance.

## 2013-11-23 NOTE — Telephone Encounter (Signed)
Patient's daughter calling to state that patient may be discharged soon and wants to ask some questions about her Sinemet medication, please call and advise.

## 2013-11-23 NOTE — Progress Notes (Signed)
Notifed Dr. Sherral Hammers at 1300 regarding pt not having an IV. Pt d/c'ing today. OK to leave IV out per Dr. Sherral Hammers.

## 2013-11-23 NOTE — Discharge Summary (Signed)
Physician Discharge Summary  Vanessa Mcbride CXK:[id number] DOB: 05-26-1924 DOA: 11/20/2013  PCP: Estill Dooms, MD  Admit date: 11/20/2013 Discharge date: 11/23/2013  Time spent:40 minutes  Recommendations for Outpatient Follow-up:  Atrial fibrillation with RVR  -digoxin 0.125 mg x1 now given; no anticoagulation for now given high bleed/fall risk after discussion with daughter  - Continue to treat underlying pain and drivers  - TSH= within normal limit,  -BNP = 1741 -Troponins x3 negative  -Per family i.e. sister (retired physician) and brother do not desire anticoagulation for pacer. Patient stable for discharge to SNF -Followup with cardiologist 30 days   Sinus pause (Multiple pauses cardiac function)  -Continue to hold all  AV nodal blocking agents to include Cardizem, metoprolol DC'd.  -Followup with cardiologist 30 days  Hypertension:  -volatile, watch closely; patient on no medication to control BP at this time  -Will discharge on Hydralazine PO 5 mg PRN for SBP> 180 over DBP> 105   Constipation:  -Continue bisacodyl 5 mg BID  -MiraLAX  17 gm BID   Thoracic vertebral fractures: (T 10)  -Tylenol -pain control; Toradol 10 mg TID PRN, and Tylenol fails.  - Advanced Parkinson's disease  - per daughter neurologist (seen by Dr. Star Age, [facility])  -Patient's Sinemet was just increased per family; 50-200 QHS;   25-100 1 1/2 tab at 0600, 1000, 1400, 1800     Discharge Diagnoses:  Active Problems:   HTN (hypertension)   Parkinson disease   Atrial fibrillation with RVR   Compression fracture of thoracic vertebra   Obstipation   Discharge Condition: Stable  Diet recommendation: Heart healthy  Filed Weights   11/21/13 0427  Weight: 67.6 kg (149 lb 0.5 oz)    History of present illness:  Vanessa Mcbride is a 80 y.o. WF PMHx  End-Stage Parkinson's disease; Hypertension; Arthritis; Scoliosis; Memory loss (06/09/2012); Aortic valve disorders  (04/11/2012); Dementia in conditions classified elsewhere without behavioral disturbance (04/07/2012); Hyperlipidemia; Osteoarthrosis, unspecified whether generalized or localized, unspecified site; Insomnia, unspecified (03/20/2012); Fracture closed, humerus (03/20/2012); Other closed fractures of distal end of radius (alone) (03/20/2012); Transient ischemic attack (TIA), and cerebral infarction without residual deficits(V12.54); Unspecified constipation; Debility, unspecified (03/18/2012); Anxiety; Generalized anxiety disorder; and Urinary incontinence (07/05/2013).  Presented with  Patient was doing well up until Sunday morning August 30 when she developed back discomfort. Patient refused to go to church. Today her daughter who is a retired physician not dictated increased complaints of back pain. She was brought to emergency department at [facility] Patient had a CT scan of her abdomen and chest showing T. 10 compression fracture new since CT scan in 2014. She will also noted to have heavy stool burden as well. While being observed in the emergency department patient developed new onset of atrial fibrillation with RVR with heart rates as high as 170s. Patient was given 5 mg of metoprolol IV. Her previously elevated systolic blood pressure up to 190s had decreased to 105.Marland Kitchen She was given IV fluids and switch to diltiazem drip. Diltiazem drip was going at 5 when patient developed sinus bradycardia and pauses up to 6 seconds. She continued to have intermittent shorter pauses between 2-3 seconds despite diltiazem being discontinued. Patient went back into A. fib with RVR with heart rates up to 140s Spoke to cardiology who recommends transfer to [facility] for further monitoring.  Of note  Family patient has been severely constipated with no bowel movements for the past 3-4 days. Recently has  Sinemet has been increased due to her rigidity. Her neurologist warned of constipation is likely side effect.  Hospitalist  was called for admission for a.fib w RVR. During patient's stay patient was initially placed on her old regimen of cardiac medication, however was found to have profound increase in her arrhythmias to include multiple episodes of sinus pauses as long as 6 seconds. All patient's cardiac medication i.e. any AV nodal blocking medication. PRN hydralazine IV was added for SBP> 180 or DBP>105. Cardiology was consulted and patient was diagnosed with tachybradycardia syndrome. Cardiology discussed the options with family members to include amiodarone, Coumadin and placement of pacer. The family felt that with patient's other comorbidities that none of these were viable options. Family chose to hold off on any additional medication or invasive procedures. In addition patient was diagnosed with a new T10 compression fracture. Neurosurgery was consulted and gave family option of kyphoplasty vs conservative treatment i.e. pain management. Patient's family chose the conservative treatment route.   Code Status: DNR Family Communication: Spoke with Daughter via phone   Disposition Plan: SNF    Consultants:  Dr. Kristeen Miss (neurosurgery)  Dr. Kirk Ruths (cardiology)   Procedure/Significant Events:  8./31 CT pelvis without contrast;Mild T10 vertebral compression fracture  - Small bilateral pleural effusions with dependent atelectasis.  -Probable hepatic and left renal cysts.  -Sigmoid diverticulosis.    Discharge Exam: Filed Vitals:   11/23/13 0720 11/23/13 1100 11/23/13 1446 11/23/13 1450  BP: 125/61 163/62  137/65  Pulse: 69 68  74  Temp:  99.3 F (37.4 C) 98.4 F (36.9 C)   TempSrc:  Axillary Oral   Resp: 16 17  19   Height:      Weight:      SpO2: 97% 95%  90%    General: A./O. x4, NAD, No acute respiratory distress  Lungs: Clear to auscultation bilaterally without wheezes or crackles  Cardiovascular: Regular rate and rhythm without murmur gallop or rub normal S1 and S2  Abdomen:  Nontender, nondistended, soft, bowel sounds positive, no rebound, no ascites, no appreciable mass  Extremities: No significant cyanosis, clubbing, or edema bilateral lower extremities  Neurologic; cranial II-XII intact, delayed response to all commands for movement secondary to Parkinson's. Right paresthesia, left upper extremity strength 3/5, left lower extremity strength 4/5, sensation intact  Discharge Instructions     Medication List    STOP taking these medications       amLODipine 2.5 MG tablet  Commonly known as:  NORVASC     antiseptic oral rinse Liqd     loperamide 2 MG capsule  Commonly known as:  IMODIUM     metoprolol succinate 25 MG 24 hr tablet  Commonly known as:  TOPROL-XL      TAKE these medications       acetaminophen 325 MG tablet  Commonly known as:  TYLENOL  Take 650 mg by mouth. Take 2 three times daily as needed     aspirin 81 MG tablet  Take 81 mg by mouth daily.     bisacodyl 5 MG EC tablet  Commonly known as:  DULCOLAX  Take 1 tablet (5 mg total) by mouth 2 (two) times daily.     carbidopa-levodopa 50-200 MG per tablet  Commonly known as:  SINEMET CR  Take 1 tablet by mouth at bedtime.     carbidopa-levodopa 25-100 MG per tablet  Commonly known as:  SINEMET IR  Take 1 1/2 tablet 4 times a day, at 6 am, 10  am, 2 pm, and 6 pm     diclofenac sodium 1 % Gel  Commonly known as:  VOLTAREN  Apply 2 g topically. Use 2 grams to each shoulder three times daily to reduce pain     escitalopram 10 MG tablet  Commonly known as:  LEXAPRO  Take 10 mg by mouth daily.     hydrALAZINE 10 MG tablet  Commonly known as:  APRESOLINE  Take 0.5 tablets (5 mg total) by mouth every 6 (six) hours as needed (SBP> 180 or DBP> 105).     ketorolac 10 MG tablet  Commonly known as:  TORADOL  Take 1 tablet (10 mg total) by mouth every 8 (eight) hours as needed (Back pain).     Melatonin 10 MG Tabs  Take 10 mg by mouth daily.     MILK OF MAGNESIA PO  Take 45 mLs  by mouth once as needed (constipation).     polyethylene glycol packet  Commonly known as:  MIRALAX / GLYCOLAX  Take 17 g by mouth 2 (two) times daily.       Allergies  Allergen Reactions  . Iodine Anaphylaxis  . Iohexol      Desc: pt has had contrast in the past (years ago) and it causea anaphylaxis- per pt's daughter.  stephanie davis,rtrct, Onset Date: [id number]    Follow-up Information   Follow up with GREEN, Viviann Spare, MD.   Specialty:  Internal Medicine   Contact information:   [place] [address] [phone]       Follow up with Kirk Ruths, MD. Schedule an appointment as soon as possible for a visit in 4 weeks. (Followup hospital; A. fib RVR not on anticoagulation or rate control medication; tachybradycardia syndrome)    Specialty:  Cardiology   Contact information:   [address] [place] [address] [phone]        The results of significant diagnostics from this hospitalization (including imaging, microbiology, ancillary and laboratory) are listed below for reference.    Significant Diagnostic Studies: Ct Abdomen Pelvis Wo Contrast  11/21/2013   CLINICAL DATA:  pain  EXAM: CT CHEST, ABDOMEN AND PELVIS WITHOUT CONTRAST  TECHNIQUE: Multidetector CT imaging of the chest, abdomen and pelvis was performed following the standard protocol without IV contrast.  COMPARISON:  None.  FINDINGS: CT CHEST FINDINGS  Tiny bilateral pleural effusions. Coarse aortic calcifications without aneurysm. No mediastinal hematoma. Dependent atelectasis posteriorly in both lower lobes. Lungs are otherwise clear. Compression fracture deformity of T10 with approximately 20% loss of height, no retropulsion, which was not evident on prior films of 02/26/2013. Small spurs at multiple levels throughout the thoracic spine. Sternum intact.  CT ABDOMEN AND PELVIS FINDINGS  Several well demarcated low-attenuation liver lesions. The larger ones in hepatic segment 3  measure fluid attenuation and probably represent cysts. The smaller ones are nonspecific.  Probable cyst in the upper pole left kidney. No hydronephrosis. Spleen, adrenal glands, and pancreas unremarkable. Unenhanced CT was performed per clinician order. Lack of IV contrast limits sensitivity and specificity, especially for evaluation of abdominal/pelvic solid viscera. Patchy aortic calcifications without aneurysm. Stomach, small bowel, nondilated. There is mild gaseous distention of the proximal colon, decompressed distally without discrete transition zone. Scattered sigmoid diverticula without adjacent inflammatory/edematous change. There is moderate distention of the urinary bladder. Bilateral pelvic phleboliths. Pelvis and lumbar spine intact.  IMPRESSION: 1. Mild T10 vertebral compression fracture deformity, new since 02/26/2013. 2. Small bilateral pleural effusions with dependent  atelectasis. 3. Probable hepatic and left renal cysts. 4. Sigmoid diverticulosis.   Electronically Signed   By: Arne Cleveland M.D.   On: 11/21/2013 00:29   Ct Chest Wo Contrast  11/21/2013   CLINICAL DATA:  pain  EXAM: CT CHEST, ABDOMEN AND PELVIS WITHOUT CONTRAST  TECHNIQUE: Multidetector CT imaging of the chest, abdomen and pelvis was performed following the standard protocol without IV contrast.  COMPARISON:  None.  FINDINGS: CT CHEST FINDINGS  Tiny bilateral pleural effusions. Coarse aortic calcifications without aneurysm. No mediastinal hematoma. Dependent atelectasis posteriorly in both lower lobes. Lungs are otherwise clear. Compression fracture deformity of T10 with approximately 20% loss of height, no retropulsion, which was not evident on prior films of 02/26/2013. Small spurs at multiple levels throughout the thoracic spine. Sternum intact.  CT ABDOMEN AND PELVIS FINDINGS  Several well demarcated low-attenuation liver lesions. The larger ones in hepatic segment 3 measure fluid attenuation and probably represent cysts.  The smaller ones are nonspecific.  Probable cyst in the upper pole left kidney. No hydronephrosis. Spleen, adrenal glands, and pancreas unremarkable. Unenhanced CT was performed per clinician order. Lack of IV contrast limits sensitivity and specificity, especially for evaluation of abdominal/pelvic solid viscera. Patchy aortic calcifications without aneurysm. Stomach, small bowel, nondilated. There is mild gaseous distention of the proximal colon, decompressed distally without discrete transition zone. Scattered sigmoid diverticula without adjacent inflammatory/edematous change. There is moderate distention of the urinary bladder. Bilateral pelvic phleboliths. Pelvis and lumbar spine intact.  IMPRESSION: 1. Mild T10 vertebral compression fracture deformity, new since 02/26/2013. 2. Small bilateral pleural effusions with dependent atelectasis. 3. Probable hepatic and left renal cysts. 4. Sigmoid diverticulosis.   Electronically Signed   By: Arne Cleveland M.D.   On: 11/21/2013 00:29    Microbiology: Recent Results (from the past 240 hour(s))  MRSA PCR SCREENING     Status: None   Collection Time    11/21/13  8:00 AM      Result Value Ref Range Status   MRSA by PCR NEGATIVE  NEGATIVE Final   Comment:            The GeneXpert MRSA Assay (FDA     approved for NASAL specimens     only), is one component of a     comprehensive MRSA colonization     surveillance program. It is not     intended to diagnose MRSA     infection nor to guide or     monitor treatment for     MRSA infections.     Labs: Basic Metabolic Panel:  Recent Labs Lab 11/20/13 2001 11/21/13 0605 11/23/13 0249  NA 136* 138 138  K 3.9 3.6* 4.1  CL 94* 99 100  CO2 29 27 28   GLUCOSE 138* 147* 106*  BUN 12 12 17   CREATININE 0.46* 0.41* 0.35*  CALCIUM 9.6 8.9 8.3*  MG  --  2.5  --   PHOS  --  2.7  --    Liver Function Tests:  Recent Labs Lab 11/21/13 0605  AST 9  ALT <5  ALKPHOS 55  BILITOT 1.0  PROT 6.7   ALBUMIN 3.4*   No results found for this basename: LIPASE, AMYLASE,  in the last 168 hours No results found for this basename: AMMONIA,  in the last 168 hours CBC:  Recent Labs Lab 11/20/13 2001 11/21/13 0605  WBC 10.6* 10.4  HGB 14.9 14.4  HCT 44.4 43.5  MCV 94.9 96.5  PLT 210 212  Cardiac Enzymes:  Recent Labs Lab 11/21/13 0605 11/21/13 1145 11/21/13 1816  TROPONINI <0.30 <0.30 <0.30   BNP: BNP (last 3 results)  Recent Labs  11/21/13 1816  PROBNP 1741.0*   CBG: No results found for this basename: GLUCAP,  in the last 168 hours     Signed:  Dia Crawford, MD [facility] [phone] pager

## 2013-11-23 NOTE — Care Management Note (Signed)
    Page 1 of 1   11/24/2013     10:08:44 AM CARE MANAGEMENT NOTE 11/24/2013  Patient:  Vanessa Mcbride, Vanessa Mcbride   Account Number:  [id number]  Date Initiated:  11/21/2013  Documentation initiated by:  Marvetta Gibbons  Subjective/Objective Assessment:   Pt admitted with afib     Action/Plan:   PTA pt lived at [facility] ALF- csw consulted   Anticipated DC Date:  11/23/2013   Anticipated DC Plan:  [address]  In-house referral  Clinical Social Worker      DC Planning Services  CM consult      Choice offered to / List presented to:             Status of service:  Completed, signed off Medicare Important Message given?  YES (If response is "NO", the following Medicare IM given date fields will be blank) Date Medicare IM given:  11/23/2013 Medicare IM given by:  Marvetta Gibbons Date Additional Medicare IM given:   Additional Medicare IM given by:    Discharge Disposition:  [facility]  Per UR Regulation:  Reviewed for med. necessity/level of care/duration of stay  If discussed at [place] of Stay Meetings, dates discussed:    Comments:  11/23/13- 1145- Marvetta Gibbons RN, BSN [phone] PT/OT have recommended SNF level of care on return to [place] aware and working to see if [facility] has SNF bed available- per MD pt may be ready for d/c today if bed available- copy of mailed Medicare IM given to daughter and signed copy placed in shadow chart

## 2013-11-24 ENCOUNTER — Telehealth: Payer: Self-pay | Admitting: Neurology

## 2013-11-24 DIAGNOSIS — IMO0002 Reserved for concepts with insufficient information to code with codable children: Secondary | ICD-10-CM | POA: Diagnosis not present

## 2013-11-24 DIAGNOSIS — M545 Low back pain, unspecified: Secondary | ICD-10-CM | POA: Diagnosis not present

## 2013-11-24 DIAGNOSIS — R279 Unspecified lack of coordination: Secondary | ICD-10-CM | POA: Diagnosis not present

## 2013-11-24 DIAGNOSIS — M546 Pain in thoracic spine: Secondary | ICD-10-CM | POA: Diagnosis not present

## 2013-11-24 DIAGNOSIS — I4891 Unspecified atrial fibrillation: Secondary | ICD-10-CM | POA: Diagnosis not present

## 2013-11-24 DIAGNOSIS — G20 Parkinson's disease: Secondary | ICD-10-CM | POA: Diagnosis not present

## 2013-11-24 DIAGNOSIS — R1312 Dysphagia, oropharyngeal phase: Secondary | ICD-10-CM | POA: Diagnosis not present

## 2013-11-24 DIAGNOSIS — R269 Unspecified abnormalities of gait and mobility: Secondary | ICD-10-CM | POA: Diagnosis not present

## 2013-11-24 DIAGNOSIS — Z4789 Encounter for other orthopedic aftercare: Secondary | ICD-10-CM | POA: Diagnosis not present

## 2013-11-24 DIAGNOSIS — R471 Dysarthria and anarthria: Secondary | ICD-10-CM | POA: Diagnosis not present

## 2013-11-24 DIAGNOSIS — R488 Other symbolic dysfunctions: Secondary | ICD-10-CM | POA: Diagnosis not present

## 2013-11-24 DIAGNOSIS — M6281 Muscle weakness (generalized): Secondary | ICD-10-CM | POA: Diagnosis not present

## 2013-11-24 NOTE — Telephone Encounter (Signed)
Patient's daughter calling again, states that she is desperate for information regarding patient, please call back and advise.

## 2013-11-24 NOTE — Telephone Encounter (Signed)
I reviewed the patient's recent discharge summary from her hospital stay from 11/20/2013 through 11/23/2013. She was discharged to skilled nursing facility. She was admitted secondary to back pain and abdominal discomfort. She was found to have severe constipation. Symptoms started on 11/19/2013. She was brought to the emergency room at [facility]. CT abdomen and chest showed a T10 compression fracture which was new since her last CT in 2014. She had a heavy stool burden as well. She was noted to have new onset A. fib with rapid RVR in the 170s. She was treated with IV metoprolol and diltiazem drip. She had not had a bowel movement in 3-4 days. She was treated with Dulcolax and MiraLax. She received symptomatic pain control for her thoracic vertebral fracture. She received digoxin but no anticoagulation due to fall risk. Troponins were negative x3.  I talked to her daughter [name] at length on the phone. Patient finally had a large enough bowel movement today. She is now at [facility] in rehabilitation and she continues to take Sinemet 1-1/2 pills at 6, 10, 2 PM and 6 PM and long-acting Sinemet at bedtime. I explained to the patient's daughter that things are becoming quite brittle at this time. She is progressing and she has been showing complications. She is at risk for constipation because of her Parkinson's disease, her advanced age, her decrease in mobility and we need to make sure that she is not going to get obstipation again. This can be difficult to balance. I suggested MiraLax half a scoop on a daily basis unless she develops diarrhea. We have to make sure she has a healthy nutrition and does not eat constipating foods such as white bread. We have to make sure she drinks enough water. I would be reluctant to scale back on the Sinemet because she was quite progressed in her Parkinson's symptoms and signs for me last time I saw her on 11/07/2013. She has an appointment next month with my nurse  practitioner and I will be sure to see her at that time as well. If I need to see her sooner I would be happy to. I explained to the patient's daughter that her obstipation and stool retention is more likely to be a consequence of her condition and multiple different factors rather than the increase in her Sinemet but I cannot be 100% sure. At this juncture, we mutually decided to continue with the Sinemet at the current dose that we had decided on on 11/07/2013 and give her the best supportive care possible. She will continue with Toradol as needed alternating with Tylenol as needed for her back pain from her thoracic vertebral fracture. [name] was in agreement.

## 2013-11-24 NOTE — Telephone Encounter (Signed)
Patient's daughter is requesting to speak with Dr Rexene Alberts personally concerning some guidance options on the parkinson's and should the sinemet be decreased.  The patient was hospitalized for a week due to pain of a  Fracture,bowels(constipation had set in). She is now in rehab.

## 2013-11-27 DIAGNOSIS — M545 Low back pain, unspecified: Secondary | ICD-10-CM | POA: Diagnosis not present

## 2013-11-27 DIAGNOSIS — G20 Parkinson's disease: Secondary | ICD-10-CM | POA: Diagnosis not present

## 2013-11-27 DIAGNOSIS — R471 Dysarthria and anarthria: Secondary | ICD-10-CM | POA: Diagnosis not present

## 2013-11-27 DIAGNOSIS — R279 Unspecified lack of coordination: Secondary | ICD-10-CM | POA: Diagnosis not present

## 2013-11-27 DIAGNOSIS — I4891 Unspecified atrial fibrillation: Secondary | ICD-10-CM | POA: Diagnosis not present

## 2013-11-27 DIAGNOSIS — R488 Other symbolic dysfunctions: Secondary | ICD-10-CM | POA: Diagnosis not present

## 2013-11-28 ENCOUNTER — Non-Acute Institutional Stay (SKILLED_NURSING_FACILITY): Payer: Medicare Other | Admitting: Nurse Practitioner

## 2013-11-28 DIAGNOSIS — K5900 Constipation, unspecified: Secondary | ICD-10-CM | POA: Diagnosis not present

## 2013-11-28 DIAGNOSIS — R471 Dysarthria and anarthria: Secondary | ICD-10-CM | POA: Diagnosis not present

## 2013-11-28 DIAGNOSIS — F0280 Dementia in other diseases classified elsewhere without behavioral disturbance: Secondary | ICD-10-CM

## 2013-11-28 DIAGNOSIS — IMO0002 Reserved for concepts with insufficient information to code with codable children: Secondary | ICD-10-CM

## 2013-11-28 DIAGNOSIS — G20 Parkinson's disease: Secondary | ICD-10-CM

## 2013-11-28 DIAGNOSIS — R488 Other symbolic dysfunctions: Secondary | ICD-10-CM | POA: Diagnosis not present

## 2013-11-28 DIAGNOSIS — M545 Low back pain, unspecified: Secondary | ICD-10-CM | POA: Diagnosis not present

## 2013-11-28 DIAGNOSIS — I4891 Unspecified atrial fibrillation: Secondary | ICD-10-CM | POA: Diagnosis not present

## 2013-11-28 DIAGNOSIS — I10 Essential (primary) hypertension: Secondary | ICD-10-CM | POA: Diagnosis not present

## 2013-11-28 DIAGNOSIS — S22000D Wedge compression fracture of unspecified thoracic vertebra, subsequent encounter for fracture with routine healing: Secondary | ICD-10-CM

## 2013-11-28 DIAGNOSIS — R279 Unspecified lack of coordination: Secondary | ICD-10-CM | POA: Diagnosis not present

## 2013-11-28 NOTE — Progress Notes (Signed)
Patient ID: Vanessa Mcbride, female   DOB: 1925/01/06, 80 y.o.   MRN: [id number]     Location:  [place] [facility] Service: [facility]    Allergies  Allergen Reactions  . Iodine Anaphylaxis  . Iohexol      Desc: pt has had contrast in the past (years ago) and it causea anaphylaxis- per pt's daughter.  stephanie davis,rtrct, Onset Date: [id number]     Chief Complaint  Patient presents with  . Hospitalization Follow-up    HPI:  Vanessa Mcbride is a 80 y.o. With a pmh of Parkinson's disease; Hypertension; Arthritis; Scoliosis; Aortic valve disorder; Dementia in conditions classified elsewhere without behavioral disturbance; Hyperlipidemia; Osteoarthrosis, who is being seen at [facility] as a hospital follow up. She was hospitalized for back pain which on workup revealed a t 10 compression fracture which was new. Neurosurgery was consulted and gave family option of kyphoplasty vs conservative treatment i.e. pain management. Patient's family chose the conservative treatment route. Pt has done well with this and nursing does not report pt has been in pain  In the ED she developed a fib with RVR and elevated BP. She was treated for this however she started having bradycardia with 6 sec pauses therefore diltiazem was stopped. During patient's stay patient was initially placed on her old regimen of cardiac medication, however was found to have profound increase in her arrhythmias to include multiple episodes of sinus pauses as long as 6 seconds and all patient's cardiac medication i.e. any AV nodal blocking medication. PRN hydralazine IV was added for SBP> 180 or DBP>105. Which as been cont here at [facility] via PO route.  Cardiology was consulted and patient was diagnosed with tachybradycardia syndrome. Cardiology discussed the options with family members but family felt that due to patient's other comorbidities that none of these were viable options. pts heart rate has been well  controlled since back at [facility] however BP has been elevated  Pt was also severely constipated while in the hospital and treated for this. pts medications were cont at [facility] and she has been moving her bowels regularly.  Pt with advanced parkinson's disease and has been followed by neurology, sinemet was increased prior to hospitalization and this was unchanged   Medications: Patient's Medications  New Prescriptions   No medications on file  Previous Medications   ACETAMINOPHEN (TYLENOL) 325 MG TABLET    Take 650 mg by mouth. Take 2 three times daily as needed   ASPIRIN 81 MG TABLET    Take 81 mg by mouth daily.   BISACODYL (DULCOLAX) 5 MG EC TABLET    Take 1 tablet (5 mg total) by mouth 2 (two) times daily.   CARBIDOPA-LEVODOPA (SINEMET CR) 50-200 MG PER TABLET    Take 1 tablet by mouth at bedtime.   CARBIDOPA-LEVODOPA (SINEMET IR) 25-100 MG PER TABLET    Take 1 1/2 tablet 4 times a day, at 6 am, 10 am, 2 pm, and 6 pm   DICLOFENAC SODIUM (VOLTAREN) 1 % GEL    Apply 2 g topically. Use 2 grams to each shoulder three times daily to reduce pain   ESCITALOPRAM (LEXAPRO) 10 MG TABLET    Take 10 mg by mouth daily.   HYDRALAZINE (APRESOLINE) 10 MG TABLET    Take 0.5 tablets (5 mg total) by mouth every 6 (six) hours as needed (SBP> 180 or DBP> 105).   KETOROLAC (TORADOL) 10 MG TABLET    Take 1 tablet (10  mg total) by mouth every 8 (eight) hours as needed (Back pain).   MAGNESIUM HYDROXIDE (MILK OF MAGNESIA PO)    Take 45 mLs by mouth once as needed (constipation).   MELATONIN 10 MG TABS    Take 10 mg by mouth daily.   POLYETHYLENE GLYCOL (MIRALAX / GLYCOLAX) PACKET    Take 17 g by mouth 2 (two) times daily.  Modified Medications   No medications on file  Discontinued Medications   No medications on file     Review of Systems  Unable to obtain due to severe dementia and parkinson's disease- see HPI    Filed Vitals:   11/28/13 1716  BP: 176/84  Pulse: 63  Temp: 96.3 F (35.7 C)   Resp: 20   There is no weight on file to calculate BMI.  Physical Exam  Constitutional:  Frail elderly female in no acute distress.  HENT:  Head: Normocephalic and atraumatic.  Right Ear: External ear normal.  Left Ear: External ear normal.  Nose: Nose normal.  Eyes: Conjunctivae and EOM are normal. Pupils are equal, round, and reactive to light.  Neck: Normal range of motion. Neck supple. No JVD present. No tracheal deviation present. No thyromegaly present.  Cardiovascular: Normal rate, regular rhythm, normal heart sounds and intact distal pulses.  Exam reveals no gallop and no friction rub.   No murmur heard. Pulmonary/Chest: Effort normal and breath sounds normal. No respiratory distress. She has no wheezes. She has no rales. She exhibits no tenderness.  Abdominal: Soft. Bowel sounds are normal. She exhibits no distension and no mass. There is no tenderness.  Musculoskeletal:  Moves all 4 extremities, slow movement, rigidity present, no leg edema, weakness present  No pain noted Neurological:  Mild memory loss. Cranial nerves intact. She does have a tremor when arms are extended. She has Parkinson's disease.  Skin: Skin is warm and dry. No pallor.  Psychiatric: She is alert     Labs reviewed: Admission on 11/20/2013, Discharged on 11/23/2013  Component Date Value Ref Range Status  . Sodium 11/20/2013 136* 137 - 147 mEq/L Final  . Potassium 11/20/2013 3.9  3.7 - 5.3 mEq/L Final  . Chloride 11/20/2013 94* 96 - 112 mEq/L Final  . CO2 11/20/2013 29  19 - 32 mEq/L Final  . Glucose, Bld 11/20/2013 138* 70 - 99 mg/dL Final  . BUN 11/20/2013 12  6 - 23 mg/dL Final  . Creatinine, Ser 11/20/2013 0.46* 0.50 - 1.10 mg/dL Final  . Calcium 11/20/2013 9.6  8.4 - 10.5 mg/dL Final  . GFR calc non Af Amer 11/20/2013 88* >90 mL/min Final  . GFR calc Af Amer 11/20/2013 >90  >90 mL/min Final   Comment: (NOTE)                          The eGFR has been calculated using the CKD EPI equation.                            This calculation has not been validated in all clinical situations.                          eGFR's persistently <90 mL/min signify possible Chronic Kidney                          Disease.  . Anion gap  11/20/2013 13  5 - 15 Final  . WBC 11/20/2013 10.6* 4.0 - 10.5 K/uL Final  . RBC 11/20/2013 4.68  3.87 - 5.11 MIL/uL Final  . Hemoglobin 11/20/2013 14.9  12.0 - 15.0 g/dL Final  . HCT 11/20/2013 44.4  36.0 - 46.0 % Final  . MCV 11/20/2013 94.9  78.0 - 100.0 fL Final  . MCH 11/20/2013 31.8  26.0 - 34.0 pg Final  . MCHC 11/20/2013 33.6  30.0 - 36.0 g/dL Final  . RDW 11/20/2013 12.3  11.5 - 15.5 % Final  . Platelets 11/20/2013 210  150 - 400 K/uL Final  . Color, Urine 11/20/2013 YELLOW  YELLOW Final  . APPearance 11/20/2013 TURBID* CLEAR Final  . Specific Gravity, Urine 11/20/2013 1.014  1.005 - 1.030 Final  . pH 11/20/2013 8.0  5.0 - 8.0 Final  . Glucose, UA 11/20/2013 NEGATIVE  NEGATIVE mg/dL Final  . Hgb urine dipstick 11/20/2013 NEGATIVE  NEGATIVE Final  . Bilirubin Urine 11/20/2013 NEGATIVE  NEGATIVE Final  . Ketones, ur 11/20/2013 NEGATIVE  NEGATIVE mg/dL Final  . Protein, ur 11/20/2013 30* NEGATIVE mg/dL Final  . Urobilinogen, UA 11/20/2013 0.2  0.0 - 1.0 mg/dL Final  . Nitrite 11/20/2013 NEGATIVE  NEGATIVE Final  . Leukocytes, UA 11/20/2013 NEGATIVE  NEGATIVE Final  . Squamous Epithelial / LPF 11/20/2013 RARE  RARE Final  . WBC, UA 11/20/2013 0-2  <3 WBC/hpf Final  . Bacteria, UA 11/20/2013 RARE  RARE Final  . Casts 11/20/2013 HYALINE CASTS* NEGATIVE Final  . Crystals 11/20/2013 TRIPLE PHOSPHATE CRYSTALS* NEGATIVE Final  . Urine-Other 11/20/2013 AMORPHOUS URATES/PHOSPHATES   Final  . Troponin i, poc 11/21/2013 0.02  0.00 - 0.08 ng/mL Final  . Comment 3 11/21/2013          Final   Comment: Due to the release kinetics of cTnI,                          a negative result within the first hours                          of the onset of symptoms does not rule  out                          myocardial infarction with certainty.                          If myocardial infarction is still suspected,                          repeat the test at appropriate intervals.  . Lactic Acid, Venous 11/21/2013 1.2  0.5 - 2.2 mmol/L Final  . Magnesium 11/21/2013 2.5  1.5 - 2.5 mg/dL Final  . Phosphorus 11/21/2013 2.7  2.3 - 4.6 mg/dL Final  . TSH 11/21/2013 1.470  0.350 - 4.500 uIU/mL Final  . Sodium 11/21/2013 138  137 - 147 mEq/L Final  . Potassium 11/21/2013 3.6* 3.7 - 5.3 mEq/L Final  . Chloride 11/21/2013 99  96 - 112 mEq/L Final  . CO2 11/21/2013 27  19 - 32 mEq/L Final  . Glucose, Bld 11/21/2013 147* 70 - 99 mg/dL Final  . BUN 11/21/2013 12  6 - 23 mg/dL Final  . Creatinine, Ser 11/21/2013 0.41* 0.50 - 1.10 mg/dL Final  . Calcium 11/21/2013 8.9  8.4 - 10.5 mg/dL Final  . Total Protein 11/21/2013 6.7  6.0 - 8.3 g/dL Final  . Albumin 11/21/2013 3.4* 3.5 - 5.2 g/dL Final  . AST 11/21/2013 9  0 - 37 U/L Final  . ALT 11/21/2013 <5  0 - 35 U/L Final  . Alkaline Phosphatase 11/21/2013 55  39 - 117 U/L Final  . Total Bilirubin 11/21/2013 1.0  0.3 - 1.2 mg/dL Final  . GFR calc non Af Amer 11/21/2013 >90  >90 mL/min Final  . GFR calc Af Amer 11/21/2013 >90  >90 mL/min Final   Comment: (NOTE)                          The eGFR has been calculated using the CKD EPI equation.                          This calculation has not been validated in all clinical situations.                          eGFR's persistently <90 mL/min signify possible Chronic Kidney                          Disease.  . Anion gap 11/21/2013 12  5 - 15 Final  . WBC 11/21/2013 10.4  4.0 - 10.5 K/uL Final  . RBC 11/21/2013 4.51  3.87 - 5.11 MIL/uL Final  . Hemoglobin 11/21/2013 14.4  12.0 - 15.0 g/dL Final  . HCT 11/21/2013 43.5  36.0 - 46.0 % Final  . MCV 11/21/2013 96.5  78.0 - 100.0 fL Final  . MCH 11/21/2013 31.9  26.0 - 34.0 pg Final  . MCHC 11/21/2013 33.1  30.0 - 36.0 g/dL Final  . RDW  11/21/2013 12.3  11.5 - 15.5 % Final  . Platelets 11/21/2013 212  150 - 400 K/uL Final  . Troponin I 11/21/2013 <0.30  <0.30 ng/mL Final   Comment:                                 Due to the release kinetics of cTnI,                          a negative result within the first hours                          of the onset of symptoms does not rule out                          myocardial infarction with certainty.                          If myocardial infarction is still suspected,                          repeat the test at appropriate intervals.  . Troponin I 11/21/2013 <0.30  <0.30 ng/mL Final   Comment:                                 Due to the release  kinetics of cTnI,                          a negative result within the first hours                          of the onset of symptoms does not rule out                          myocardial infarction with certainty.                          If myocardial infarction is still suspected,                          repeat the test at appropriate intervals.  . Troponin I 11/21/2013 <0.30  <0.30 ng/mL Final   Comment:                                 Due to the release kinetics of cTnI,                          a negative result within the first hours                          of the onset of symptoms does not rule out                          myocardial infarction with certainty.                          If myocardial infarction is still suspected,                          repeat the test at appropriate intervals.  Marland Kitchen MRSA by PCR 11/21/2013 NEGATIVE  NEGATIVE Final   Comment:                                 The GeneXpert MRSA Assay (FDA                          approved for NASAL specimens                          only), is one component of a                          comprehensive MRSA colonization                          surveillance program. It is not                          intended to diagnose MRSA                          infection nor  to guide or  monitor treatment for                          MRSA infections.  . Pro B Natriuretic peptide (BNP) 11/21/2013 1741.0* 0 - 450 pg/mL Final  . Sodium 11/23/2013 138  137 - 147 mEq/L Final  . Potassium 11/23/2013 4.1  3.7 - 5.3 mEq/L Final  . Chloride 11/23/2013 100  96 - 112 mEq/L Final  . CO2 11/23/2013 28  19 - 32 mEq/L Final  . Glucose, Bld 11/23/2013 106* 70 - 99 mg/dL Final  . BUN 11/23/2013 17  6 - 23 mg/dL Final  . Creatinine, Ser 11/23/2013 0.35* 0.50 - 1.10 mg/dL Final  . Calcium 11/23/2013 8.3* 8.4 - 10.5 mg/dL Final  . GFR calc non Af Amer 11/23/2013 >90  >90 mL/min Final  . GFR calc Af Amer 11/23/2013 >90  >90 mL/min Final   Comment: (NOTE)                          The eGFR has been calculated using the CKD EPI equation.                          This calculation has not been validated in all clinical situations.                          eGFR's persistently <90 mL/min signify possible Chronic Kidney                          Disease.  . Anion gap 11/23/2013 10  5 - 15 Final      Assessment/Plan 1. Essential hypertension -chart reviewed, pt previously on norvasc  -will restart norvasc 5 mg daily -conts hydrazine 5 mg as needed   2. Unspecified constipation -well controlled on current medication, bowels moving regularly   3. Atrial fibrillation with RVR -rate controlled at this time, currently off all av node blocking agent -conts on asa  4. Parkinson disease -conts to follow with neurology regarding parkinson disease   5. Dementia in conditions classified elsewhere without behavioral disturbance -advanced disease  6. Compression fracture of thoracic vertebra, with routine healing, subsequent encounter -pt without symptoms of pain, conts on tylenol as needed

## 2013-11-29 DIAGNOSIS — R279 Unspecified lack of coordination: Secondary | ICD-10-CM | POA: Diagnosis not present

## 2013-11-29 DIAGNOSIS — R471 Dysarthria and anarthria: Secondary | ICD-10-CM | POA: Diagnosis not present

## 2013-11-29 DIAGNOSIS — R488 Other symbolic dysfunctions: Secondary | ICD-10-CM | POA: Diagnosis not present

## 2013-11-29 DIAGNOSIS — M545 Low back pain, unspecified: Secondary | ICD-10-CM | POA: Diagnosis not present

## 2013-11-29 DIAGNOSIS — G20 Parkinson's disease: Secondary | ICD-10-CM | POA: Diagnosis not present

## 2013-11-29 DIAGNOSIS — I4891 Unspecified atrial fibrillation: Secondary | ICD-10-CM | POA: Diagnosis not present

## 2013-11-30 DIAGNOSIS — I4891 Unspecified atrial fibrillation: Secondary | ICD-10-CM | POA: Diagnosis not present

## 2013-11-30 DIAGNOSIS — M545 Low back pain, unspecified: Secondary | ICD-10-CM | POA: Diagnosis not present

## 2013-11-30 DIAGNOSIS — R279 Unspecified lack of coordination: Secondary | ICD-10-CM | POA: Diagnosis not present

## 2013-11-30 DIAGNOSIS — G20 Parkinson's disease: Secondary | ICD-10-CM | POA: Diagnosis not present

## 2013-11-30 DIAGNOSIS — R471 Dysarthria and anarthria: Secondary | ICD-10-CM | POA: Diagnosis not present

## 2013-11-30 DIAGNOSIS — R488 Other symbolic dysfunctions: Secondary | ICD-10-CM | POA: Diagnosis not present

## 2013-12-01 ENCOUNTER — Non-Acute Institutional Stay (SKILLED_NURSING_FACILITY): Payer: Medicare Other | Admitting: Internal Medicine

## 2013-12-01 DIAGNOSIS — G20 Parkinson's disease: Secondary | ICD-10-CM

## 2013-12-01 DIAGNOSIS — M4854XS Collapsed vertebra, not elsewhere classified, thoracic region, sequela of fracture: Secondary | ICD-10-CM

## 2013-12-01 DIAGNOSIS — R471 Dysarthria and anarthria: Secondary | ICD-10-CM | POA: Diagnosis not present

## 2013-12-01 DIAGNOSIS — M545 Low back pain, unspecified: Secondary | ICD-10-CM | POA: Diagnosis not present

## 2013-12-01 DIAGNOSIS — I4891 Unspecified atrial fibrillation: Secondary | ICD-10-CM

## 2013-12-01 DIAGNOSIS — R5381 Other malaise: Secondary | ICD-10-CM

## 2013-12-01 DIAGNOSIS — I10 Essential (primary) hypertension: Secondary | ICD-10-CM

## 2013-12-01 DIAGNOSIS — F3289 Other specified depressive episodes: Secondary | ICD-10-CM

## 2013-12-01 DIAGNOSIS — R279 Unspecified lack of coordination: Secondary | ICD-10-CM | POA: Diagnosis not present

## 2013-12-01 DIAGNOSIS — K5900 Constipation, unspecified: Secondary | ICD-10-CM | POA: Diagnosis not present

## 2013-12-01 DIAGNOSIS — F32A Depression, unspecified: Secondary | ICD-10-CM

## 2013-12-01 DIAGNOSIS — IMO0002 Reserved for concepts with insufficient information to code with codable children: Secondary | ICD-10-CM

## 2013-12-01 DIAGNOSIS — R488 Other symbolic dysfunctions: Secondary | ICD-10-CM | POA: Diagnosis not present

## 2013-12-01 DIAGNOSIS — F329 Major depressive disorder, single episode, unspecified: Secondary | ICD-10-CM

## 2013-12-02 ENCOUNTER — Encounter: Payer: Self-pay | Admitting: Internal Medicine

## 2013-12-02 DIAGNOSIS — I4891 Unspecified atrial fibrillation: Secondary | ICD-10-CM | POA: Diagnosis not present

## 2013-12-02 DIAGNOSIS — R279 Unspecified lack of coordination: Secondary | ICD-10-CM | POA: Diagnosis not present

## 2013-12-02 DIAGNOSIS — M545 Low back pain, unspecified: Secondary | ICD-10-CM | POA: Diagnosis not present

## 2013-12-02 DIAGNOSIS — R488 Other symbolic dysfunctions: Secondary | ICD-10-CM | POA: Diagnosis not present

## 2013-12-02 DIAGNOSIS — G20 Parkinson's disease: Secondary | ICD-10-CM | POA: Diagnosis not present

## 2013-12-02 DIAGNOSIS — R471 Dysarthria and anarthria: Secondary | ICD-10-CM | POA: Diagnosis not present

## 2013-12-02 NOTE — Progress Notes (Signed)
Patient ID: Vanessa Mcbride, female   DOB: [birth year], 80 y.o.   MRN: [id number]     [facility]  PCP: Estill Dooms, MD  Code Status: DNR  Allergies  Allergen Reactions  . Iodine Anaphylaxis  . Iohexol      Desc: pt has had contrast in the past (years ago) and it causea anaphylaxis- per pt's daughter.  stephanie davis,rtrct, Onset Date: [id number]     Chief Complaint: new admission  HPI:  81 y/o female patient is here for STR after hospital admission from 11/20/13-11/23/13 with back pain and new onset afib with RVR. She was started on SAV nodal blocking agent but then developed sinus pause and bradycardia. Cardiology was consulted and patient was diagnosed with tachybrady syndrome. Coumadin, amiodarone and pacer were recommended but conservative management was opted by family. She was also noted to have T10 compression fracture and neurosurgery was consulted. kyphoplasty vs conservative management was recommended and later was opted. She has history of end stage parkinson's, HTN, scoliosis, arthritis She is seen in her room today. Prior to this admission she resided in ALF. She answers in yes or no format and it is difficult to obtain history and ROS from patient. She is in no distress and denies any particular concern. Her caregiver is in the room and denies any concerns besides weakness. On pureed diet for now. She had 2 bowel movememnt yesterday  Review of Systems: from patient, caregiver and charge nurse Constitutional: Negative for fever, chills, diaphoresis.  HENT: Negative for congestion.   Eyes: Negative for eye pain, blurred vision  Respiratory: Negative for cough, sputum production, shortness of breath   Cardiovascular: Negative for chest pain, palpitations, leg swelling.  Gastrointestinal: Negative for heartburn, nausea, vomiting, abdominal pain Genitourinary: Negative for dysuria Musculoskeletal: Negative for falls. Has back pain worsened with movement Skin:  Negative for itching and rash.  Neurological: positive for weakness     Past Medical History  Diagnosis Date  . Parkinson's disease   . Hypertension   . Arthritis     osteoarthritis  . Scoliosis   . Memory loss 06/09/2012  . Aortic valve disorders 04/11/2012  . Dementia in conditions classified elsewhere without behavioral disturbance 04/07/2012  . Hyperlipidemia   . Osteoarthrosis, unspecified whether generalized or localized, unspecified site   . Insomnia, unspecified 03/20/2012  . Fracture closed, humerus 03/20/2012  . Other closed fractures of distal end of radius (alone) 03/20/2012  . Transient ischemic attack (TIA), and cerebral infarction without residual deficits(V12.54)   . Unspecified constipation   . Debility, unspecified 03/18/2012  . Anxiety   . Generalized anxiety disorder   . Urinary incontinence 07/05/2013    Re: PD   Past Surgical History  Procedure Laterality Date  . Breast surgery      benign lumpectomy  . Cholecystectomy     Social History:   reports that she has never smoked. She has never used smokeless tobacco. She reports that she does not drink alcohol or use illicit drugs.  Family History  Problem Relation Age of Onset  . Heart disease Son     CAD  . Diabetes Mother   . Diabetes Sister   . Hypertension Sister     Medications: Patient's Medications  New Prescriptions   No medications on file  Previous Medications   ACETAMINOPHEN (TYLENOL) 325 MG TABLET    Take 650 mg by mouth. Take 2 three times daily as needed   ASPIRIN 81 MG TABLET  Take 81 mg by mouth daily.   BISACODYL (DULCOLAX) 5 MG EC TABLET    Take 1 tablet (5 mg total) by mouth 2 (two) times daily.   CARBIDOPA-LEVODOPA (SINEMET CR) 50-200 MG PER TABLET    Take 1 tablet by mouth at bedtime.   CARBIDOPA-LEVODOPA (SINEMET IR) 25-100 MG PER TABLET    Take 1 1/2 tablet 4 times a day, at 6 am, 10 am, 2 pm, and 6 pm   DICLOFENAC SODIUM (VOLTAREN) 1 % GEL    Apply 2 g topically. Use 2  grams to each shoulder three times daily to reduce pain   ESCITALOPRAM (LEXAPRO) 10 MG TABLET    Take 10 mg by mouth daily.   HYDRALAZINE (APRESOLINE) 10 MG TABLET    Take 0.5 tablets (5 mg total) by mouth every 6 (six) hours as needed (SBP> 180 or DBP> 105).   KETOROLAC (TORADOL) 10 MG TABLET    Take 1 tablet (10 mg total) by mouth every 8 (eight) hours as needed (Back pain).   MAGNESIUM HYDROXIDE (MILK OF MAGNESIA PO)    Take 45 mLs by mouth once as needed (constipation).   MELATONIN 10 MG TABS    Take 10 mg by mouth daily.   POLYETHYLENE GLYCOL (MIRALAX / GLYCOLAX) PACKET    Take 17 g by mouth 2 (two) times daily.  Modified Medications   No medications on file  Discontinued Medications   No medications on file     Physical Exam: Filed Vitals:   12/01/13 1119  BP: 138/72  Pulse: 66  Temp: 98 F (36.7 C)  Resp: 18  Weight: 150 lb 12.8 oz (68.402 kg)  SpO2: 96%    General- elderly female in no acute distress Head- atraumatic, normocephalic Eyes- PERRLA Neck- no lymphadenopathy, Cardiovascular- irregular heart rate, no murmur Respiratory- bilateral clear to auscultation, no wheeze, no rhonchi, no crackles, no use of accessory muscles Abdomen- bowel sounds present, soft, non tender Musculoskeletal- able to move all 4 extremities, slow movement, some rigidity present, using walker, on recliner at present, no leg edema, weakness present  Neurological- difficult to assess Skin- warm and dry, thin and fragile skin Psychiatry- alert and oriented to person   Labs reviewed: Basic Metabolic Panel:  Recent Labs  11/20/13 2001 11/21/13 0605 11/23/13 0249  NA 136* 138 138  K 3.9 3.6* 4.1  CL 94* 99 100  CO2 29 27 28   GLUCOSE 138* 147* 106*  BUN 12 12 17   CREATININE 0.46* 0.41* 0.35*  CALCIUM 9.6 8.9 8.3*  MG  --  2.5  --   PHOS  --  2.7  --    Liver Function Tests:  Recent Labs  11/21/13 0605  AST 9  ALT <5  ALKPHOS 55  BILITOT 1.0  PROT 6.7  ALBUMIN 3.4*   No  results found for this basename: LIPASE, AMYLASE,  in the last 8760 hours No results found for this basename: AMMONIA,  in the last 8760 hours CBC:  Recent Labs  11/20/13 2001 11/21/13 0605  WBC 10.6* 10.4  HGB 14.9 14.4  HCT 44.4 43.5  MCV 94.9 96.5  PLT 210 212   Cardiac Enzymes:  Recent Labs  11/21/13 0605 11/21/13 1145 11/21/13 1816  TROPONINI <0.30 <0.30 <0.30   BNP: No components found with this basename: POCBNP,  CBG: No results found for this basename: GLUCAP,  in the last 8760 hours  Radiological Exams: Ct Abdomen Pelvis Wo Contrast  11/21/2013   CLINICAL DATA:  pain  EXAM: CT CHEST, ABDOMEN AND PELVIS WITHOUT CONTRAST  TECHNIQUE: Multidetector CT imaging of the chest, abdomen and pelvis was performed following the standard protocol without IV contrast.  COMPARISON:  None.  FINDINGS: CT CHEST FINDINGS  Tiny bilateral pleural effusions. Coarse aortic calcifications without aneurysm. No mediastinal hematoma. Dependent atelectasis posteriorly in both lower lobes. Lungs are otherwise clear. Compression fracture deformity of T10 with approximately 20% loss of height, no retropulsion, which was not evident on prior films of 02/26/2013. Small spurs at multiple levels throughout the thoracic spine. Sternum intact.  CT ABDOMEN AND PELVIS FINDINGS  Several well demarcated low-attenuation liver lesions. The larger ones in hepatic segment 3 measure fluid attenuation and probably represent cysts. The smaller ones are nonspecific.  Probable cyst in the upper pole left kidney. No hydronephrosis. Spleen, adrenal glands, and pancreas unremarkable. Unenhanced CT was performed per clinician order. Lack of IV contrast limits sensitivity and specificity, especially for evaluation of abdominal/pelvic solid viscera. Patchy aortic calcifications without aneurysm. Stomach, small bowel, nondilated. There is mild gaseous distention of the proximal colon, decompressed distally without discrete transition  zone. Scattered sigmoid diverticula without adjacent inflammatory/edematous change. There is moderate distention of the urinary bladder. Bilateral pelvic phleboliths. Pelvis and lumbar spine intact.  IMPRESSION: 1. Mild T10 vertebral compression fracture deformity, new since 02/26/2013. 2. Small bilateral pleural effusions with dependent atelectasis. 3. Probable hepatic and left renal cysts. 4. Sigmoid diverticulosis.   Electronically Signed   By: Arne Cleveland M.D.   On: 11/21/2013 00:29   Ct Chest Wo Contrast  11/21/2013   CLINICAL DATA:  pain  EXAM: CT CHEST, ABDOMEN AND PELVIS WITHOUT CONTRAST  TECHNIQUE: Multidetector CT imaging of the chest, abdomen and pelvis was performed following the standard protocol without IV contrast.  COMPARISON:  None.  FINDINGS: CT CHEST FINDINGS  Tiny bilateral pleural effusions. Coarse aortic calcifications without aneurysm. No mediastinal hematoma. Dependent atelectasis posteriorly in both lower lobes. Lungs are otherwise clear. Compression fracture deformity of T10 with approximately 20% loss of height, no retropulsion, which was not evident on prior films of 02/26/2013. Small spurs at multiple levels throughout the thoracic spine. Sternum intact.  CT ABDOMEN AND PELVIS FINDINGS  Several well demarcated low-attenuation liver lesions. The larger ones in hepatic segment 3 measure fluid attenuation and probably represent cysts. The smaller ones are nonspecific.  Probable cyst in the upper pole left kidney. No hydronephrosis. Spleen, adrenal glands, and pancreas unremarkable. Unenhanced CT was performed per clinician order. Lack of IV contrast limits sensitivity and specificity, especially for evaluation of abdominal/pelvic solid viscera. Patchy aortic calcifications without aneurysm. Stomach, small bowel, nondilated. There is mild gaseous distention of the proximal colon, decompressed distally without discrete transition zone. Scattered sigmoid diverticula without adjacent  inflammatory/edematous change. There is moderate distention of the urinary bladder. Bilateral pelvic phleboliths. Pelvis and lumbar spine intact.  IMPRESSION: 1. Mild T10 vertebral compression fracture deformity, new since 02/26/2013. 2. Small bilateral pleural effusions with dependent atelectasis. 3. Probable hepatic and left renal cysts. 4. Sigmoid diverticulosis.   Electronically Signed   By: Arne Cleveland M.D.   On: 11/21/2013 00:29   Assessment/Plan  Physical deconditioning Will have patient work with PT/OT as tolerated to regain strength and restore function.  Fall precautions are in place.  afib Rate controlled at present. Continue baby aspirin. Off antiarrythmic for now. Monitor clinically. Has f/u with dr Stanford Breed  Compression fracture Continue toradol prn with her voltaren gel. Continue therapy with therapy team as tolerated. Fall precautions  HTN  On norvasc 5 mg daily at present. bp readings have been high in facility. D/c norvasc. Add hydralazine 10 mg bid for now, check bp twice a day and if reading average in a week is > 140/90, adjust hydralazine dosing. Avoid AV nodal blocking agent for now  Constipation Continue dulcolax bid and miralax  Parkinson's disease Continue her sinemet current regimen, followed by neurology  Depression Continue her lexapro and monitor clinically continue melatonin to help with her sleep   Family/ staff Communication: reviewed care plan with patient and nursing supervisor    Goals of care: short term rehabilitation    Labs/tests ordered: cbc, cmp    Blanchie Serve, MD  [place] [phone] (Monday-Friday 8 am - 5 pm) [phone] (afterhours)

## 2013-12-04 DIAGNOSIS — R471 Dysarthria and anarthria: Secondary | ICD-10-CM | POA: Diagnosis not present

## 2013-12-04 DIAGNOSIS — G20 Parkinson's disease: Secondary | ICD-10-CM | POA: Diagnosis not present

## 2013-12-04 DIAGNOSIS — I4891 Unspecified atrial fibrillation: Secondary | ICD-10-CM | POA: Diagnosis not present

## 2013-12-04 DIAGNOSIS — M545 Low back pain, unspecified: Secondary | ICD-10-CM | POA: Diagnosis not present

## 2013-12-04 DIAGNOSIS — R488 Other symbolic dysfunctions: Secondary | ICD-10-CM | POA: Diagnosis not present

## 2013-12-04 DIAGNOSIS — R279 Unspecified lack of coordination: Secondary | ICD-10-CM | POA: Diagnosis not present

## 2013-12-05 DIAGNOSIS — Z79899 Other long term (current) drug therapy: Secondary | ICD-10-CM | POA: Diagnosis not present

## 2013-12-05 DIAGNOSIS — I4891 Unspecified atrial fibrillation: Secondary | ICD-10-CM | POA: Diagnosis not present

## 2013-12-05 DIAGNOSIS — I10 Essential (primary) hypertension: Secondary | ICD-10-CM | POA: Diagnosis not present

## 2013-12-05 DIAGNOSIS — R279 Unspecified lack of coordination: Secondary | ICD-10-CM | POA: Diagnosis not present

## 2013-12-05 DIAGNOSIS — R471 Dysarthria and anarthria: Secondary | ICD-10-CM | POA: Diagnosis not present

## 2013-12-05 DIAGNOSIS — M545 Low back pain, unspecified: Secondary | ICD-10-CM | POA: Diagnosis not present

## 2013-12-05 DIAGNOSIS — G20 Parkinson's disease: Secondary | ICD-10-CM | POA: Diagnosis not present

## 2013-12-05 DIAGNOSIS — R488 Other symbolic dysfunctions: Secondary | ICD-10-CM | POA: Diagnosis not present

## 2013-12-05 DIAGNOSIS — D649 Anemia, unspecified: Secondary | ICD-10-CM | POA: Diagnosis not present

## 2013-12-06 DIAGNOSIS — R279 Unspecified lack of coordination: Secondary | ICD-10-CM | POA: Diagnosis not present

## 2013-12-06 DIAGNOSIS — I4891 Unspecified atrial fibrillation: Secondary | ICD-10-CM | POA: Diagnosis not present

## 2013-12-06 DIAGNOSIS — M545 Low back pain, unspecified: Secondary | ICD-10-CM | POA: Diagnosis not present

## 2013-12-06 DIAGNOSIS — G20 Parkinson's disease: Secondary | ICD-10-CM | POA: Diagnosis not present

## 2013-12-06 DIAGNOSIS — R488 Other symbolic dysfunctions: Secondary | ICD-10-CM | POA: Diagnosis not present

## 2013-12-06 DIAGNOSIS — R471 Dysarthria and anarthria: Secondary | ICD-10-CM | POA: Diagnosis not present

## 2013-12-07 DIAGNOSIS — R279 Unspecified lack of coordination: Secondary | ICD-10-CM | POA: Diagnosis not present

## 2013-12-07 DIAGNOSIS — R488 Other symbolic dysfunctions: Secondary | ICD-10-CM | POA: Diagnosis not present

## 2013-12-07 DIAGNOSIS — G20 Parkinson's disease: Secondary | ICD-10-CM | POA: Diagnosis not present

## 2013-12-07 DIAGNOSIS — M545 Low back pain, unspecified: Secondary | ICD-10-CM | POA: Diagnosis not present

## 2013-12-07 DIAGNOSIS — R471 Dysarthria and anarthria: Secondary | ICD-10-CM | POA: Diagnosis not present

## 2013-12-07 DIAGNOSIS — I4891 Unspecified atrial fibrillation: Secondary | ICD-10-CM | POA: Diagnosis not present

## 2013-12-08 DIAGNOSIS — M545 Low back pain, unspecified: Secondary | ICD-10-CM | POA: Diagnosis not present

## 2013-12-08 DIAGNOSIS — I4891 Unspecified atrial fibrillation: Secondary | ICD-10-CM | POA: Diagnosis not present

## 2013-12-08 DIAGNOSIS — R488 Other symbolic dysfunctions: Secondary | ICD-10-CM | POA: Diagnosis not present

## 2013-12-08 DIAGNOSIS — R471 Dysarthria and anarthria: Secondary | ICD-10-CM | POA: Diagnosis not present

## 2013-12-08 DIAGNOSIS — R279 Unspecified lack of coordination: Secondary | ICD-10-CM | POA: Diagnosis not present

## 2013-12-08 DIAGNOSIS — G20 Parkinson's disease: Secondary | ICD-10-CM | POA: Diagnosis not present

## 2013-12-11 DIAGNOSIS — R488 Other symbolic dysfunctions: Secondary | ICD-10-CM | POA: Diagnosis not present

## 2013-12-11 DIAGNOSIS — M545 Low back pain, unspecified: Secondary | ICD-10-CM | POA: Diagnosis not present

## 2013-12-11 DIAGNOSIS — I4891 Unspecified atrial fibrillation: Secondary | ICD-10-CM | POA: Diagnosis not present

## 2013-12-11 DIAGNOSIS — R471 Dysarthria and anarthria: Secondary | ICD-10-CM | POA: Diagnosis not present

## 2013-12-11 DIAGNOSIS — R279 Unspecified lack of coordination: Secondary | ICD-10-CM | POA: Diagnosis not present

## 2013-12-11 DIAGNOSIS — G20 Parkinson's disease: Secondary | ICD-10-CM | POA: Diagnosis not present

## 2013-12-12 DIAGNOSIS — R488 Other symbolic dysfunctions: Secondary | ICD-10-CM | POA: Diagnosis not present

## 2013-12-12 DIAGNOSIS — R471 Dysarthria and anarthria: Secondary | ICD-10-CM | POA: Diagnosis not present

## 2013-12-12 DIAGNOSIS — M545 Low back pain, unspecified: Secondary | ICD-10-CM | POA: Diagnosis not present

## 2013-12-12 DIAGNOSIS — G20 Parkinson's disease: Secondary | ICD-10-CM | POA: Diagnosis not present

## 2013-12-12 DIAGNOSIS — I4891 Unspecified atrial fibrillation: Secondary | ICD-10-CM | POA: Diagnosis not present

## 2013-12-12 DIAGNOSIS — R279 Unspecified lack of coordination: Secondary | ICD-10-CM | POA: Diagnosis not present

## 2013-12-13 DIAGNOSIS — R279 Unspecified lack of coordination: Secondary | ICD-10-CM | POA: Diagnosis not present

## 2013-12-13 DIAGNOSIS — I4891 Unspecified atrial fibrillation: Secondary | ICD-10-CM | POA: Diagnosis not present

## 2013-12-13 DIAGNOSIS — R488 Other symbolic dysfunctions: Secondary | ICD-10-CM | POA: Diagnosis not present

## 2013-12-13 DIAGNOSIS — M545 Low back pain, unspecified: Secondary | ICD-10-CM | POA: Diagnosis not present

## 2013-12-13 DIAGNOSIS — G20 Parkinson's disease: Secondary | ICD-10-CM | POA: Diagnosis not present

## 2013-12-13 DIAGNOSIS — R471 Dysarthria and anarthria: Secondary | ICD-10-CM | POA: Diagnosis not present

## 2013-12-14 DIAGNOSIS — G20 Parkinson's disease: Secondary | ICD-10-CM | POA: Diagnosis not present

## 2013-12-14 DIAGNOSIS — R488 Other symbolic dysfunctions: Secondary | ICD-10-CM | POA: Diagnosis not present

## 2013-12-14 DIAGNOSIS — M545 Low back pain, unspecified: Secondary | ICD-10-CM | POA: Diagnosis not present

## 2013-12-14 DIAGNOSIS — R279 Unspecified lack of coordination: Secondary | ICD-10-CM | POA: Diagnosis not present

## 2013-12-14 DIAGNOSIS — R471 Dysarthria and anarthria: Secondary | ICD-10-CM | POA: Diagnosis not present

## 2013-12-14 DIAGNOSIS — I4891 Unspecified atrial fibrillation: Secondary | ICD-10-CM | POA: Diagnosis not present

## 2013-12-15 DIAGNOSIS — G20 Parkinson's disease: Secondary | ICD-10-CM | POA: Diagnosis not present

## 2013-12-15 DIAGNOSIS — I4891 Unspecified atrial fibrillation: Secondary | ICD-10-CM | POA: Diagnosis not present

## 2013-12-15 DIAGNOSIS — R279 Unspecified lack of coordination: Secondary | ICD-10-CM | POA: Diagnosis not present

## 2013-12-15 DIAGNOSIS — M545 Low back pain, unspecified: Secondary | ICD-10-CM | POA: Diagnosis not present

## 2013-12-15 DIAGNOSIS — R471 Dysarthria and anarthria: Secondary | ICD-10-CM | POA: Diagnosis not present

## 2013-12-15 DIAGNOSIS — R488 Other symbolic dysfunctions: Secondary | ICD-10-CM | POA: Diagnosis not present

## 2013-12-18 DIAGNOSIS — R488 Other symbolic dysfunctions: Secondary | ICD-10-CM | POA: Diagnosis not present

## 2013-12-18 DIAGNOSIS — R471 Dysarthria and anarthria: Secondary | ICD-10-CM | POA: Diagnosis not present

## 2013-12-18 DIAGNOSIS — I4891 Unspecified atrial fibrillation: Secondary | ICD-10-CM | POA: Diagnosis not present

## 2013-12-18 DIAGNOSIS — M545 Low back pain, unspecified: Secondary | ICD-10-CM | POA: Diagnosis not present

## 2013-12-18 DIAGNOSIS — G20 Parkinson's disease: Secondary | ICD-10-CM | POA: Diagnosis not present

## 2013-12-18 DIAGNOSIS — R279 Unspecified lack of coordination: Secondary | ICD-10-CM | POA: Diagnosis not present

## 2013-12-19 DIAGNOSIS — G20 Parkinson's disease: Secondary | ICD-10-CM | POA: Diagnosis not present

## 2013-12-19 DIAGNOSIS — R279 Unspecified lack of coordination: Secondary | ICD-10-CM | POA: Diagnosis not present

## 2013-12-19 DIAGNOSIS — R471 Dysarthria and anarthria: Secondary | ICD-10-CM | POA: Diagnosis not present

## 2013-12-19 DIAGNOSIS — M545 Low back pain, unspecified: Secondary | ICD-10-CM | POA: Diagnosis not present

## 2013-12-19 DIAGNOSIS — I4891 Unspecified atrial fibrillation: Secondary | ICD-10-CM | POA: Diagnosis not present

## 2013-12-19 DIAGNOSIS — R488 Other symbolic dysfunctions: Secondary | ICD-10-CM | POA: Diagnosis not present

## 2013-12-20 DIAGNOSIS — M545 Low back pain, unspecified: Secondary | ICD-10-CM | POA: Diagnosis not present

## 2013-12-20 DIAGNOSIS — R471 Dysarthria and anarthria: Secondary | ICD-10-CM | POA: Diagnosis not present

## 2013-12-20 DIAGNOSIS — R488 Other symbolic dysfunctions: Secondary | ICD-10-CM | POA: Diagnosis not present

## 2013-12-20 DIAGNOSIS — I4891 Unspecified atrial fibrillation: Secondary | ICD-10-CM | POA: Diagnosis not present

## 2013-12-20 DIAGNOSIS — R279 Unspecified lack of coordination: Secondary | ICD-10-CM | POA: Diagnosis not present

## 2013-12-20 DIAGNOSIS — G20 Parkinson's disease: Secondary | ICD-10-CM | POA: Diagnosis not present

## 2013-12-21 DIAGNOSIS — R1312 Dysphagia, oropharyngeal phase: Secondary | ICD-10-CM | POA: Diagnosis not present

## 2013-12-21 DIAGNOSIS — S22009A Unspecified fracture of unspecified thoracic vertebra, initial encounter for closed fracture: Secondary | ICD-10-CM | POA: Diagnosis not present

## 2013-12-21 DIAGNOSIS — M545 Low back pain: Secondary | ICD-10-CM | POA: Diagnosis not present

## 2013-12-21 DIAGNOSIS — M546 Pain in thoracic spine: Secondary | ICD-10-CM | POA: Diagnosis not present

## 2013-12-21 DIAGNOSIS — R278 Other lack of coordination: Secondary | ICD-10-CM | POA: Diagnosis not present

## 2013-12-21 DIAGNOSIS — G20 Parkinson's disease: Secondary | ICD-10-CM | POA: Diagnosis not present

## 2013-12-21 DIAGNOSIS — I4891 Unspecified atrial fibrillation: Secondary | ICD-10-CM | POA: Diagnosis not present

## 2013-12-21 DIAGNOSIS — Z5189 Encounter for other specified aftercare: Secondary | ICD-10-CM | POA: Diagnosis not present

## 2013-12-21 DIAGNOSIS — M6281 Muscle weakness (generalized): Secondary | ICD-10-CM | POA: Diagnosis not present

## 2013-12-21 DIAGNOSIS — R488 Other symbolic dysfunctions: Secondary | ICD-10-CM | POA: Diagnosis not present

## 2013-12-21 DIAGNOSIS — R471 Dysarthria and anarthria: Secondary | ICD-10-CM | POA: Diagnosis not present

## 2013-12-21 DIAGNOSIS — R2689 Other abnormalities of gait and mobility: Secondary | ICD-10-CM | POA: Diagnosis not present

## 2013-12-22 DIAGNOSIS — R488 Other symbolic dysfunctions: Secondary | ICD-10-CM | POA: Diagnosis not present

## 2013-12-22 DIAGNOSIS — R278 Other lack of coordination: Secondary | ICD-10-CM | POA: Diagnosis not present

## 2013-12-22 DIAGNOSIS — G20 Parkinson's disease: Secondary | ICD-10-CM | POA: Diagnosis not present

## 2013-12-22 DIAGNOSIS — I4891 Unspecified atrial fibrillation: Secondary | ICD-10-CM | POA: Diagnosis not present

## 2013-12-22 DIAGNOSIS — R471 Dysarthria and anarthria: Secondary | ICD-10-CM | POA: Diagnosis not present

## 2013-12-22 DIAGNOSIS — M545 Low back pain: Secondary | ICD-10-CM | POA: Diagnosis not present

## 2013-12-23 DIAGNOSIS — R278 Other lack of coordination: Secondary | ICD-10-CM | POA: Diagnosis not present

## 2013-12-23 DIAGNOSIS — G20 Parkinson's disease: Secondary | ICD-10-CM | POA: Diagnosis not present

## 2013-12-23 DIAGNOSIS — M545 Low back pain: Secondary | ICD-10-CM | POA: Diagnosis not present

## 2013-12-23 DIAGNOSIS — R488 Other symbolic dysfunctions: Secondary | ICD-10-CM | POA: Diagnosis not present

## 2013-12-23 DIAGNOSIS — R471 Dysarthria and anarthria: Secondary | ICD-10-CM | POA: Diagnosis not present

## 2013-12-23 DIAGNOSIS — I4891 Unspecified atrial fibrillation: Secondary | ICD-10-CM | POA: Diagnosis not present

## 2013-12-25 DIAGNOSIS — M545 Low back pain: Secondary | ICD-10-CM | POA: Diagnosis not present

## 2013-12-25 DIAGNOSIS — R471 Dysarthria and anarthria: Secondary | ICD-10-CM | POA: Diagnosis not present

## 2013-12-25 DIAGNOSIS — R488 Other symbolic dysfunctions: Secondary | ICD-10-CM | POA: Diagnosis not present

## 2013-12-25 DIAGNOSIS — I4891 Unspecified atrial fibrillation: Secondary | ICD-10-CM | POA: Diagnosis not present

## 2013-12-25 DIAGNOSIS — R278 Other lack of coordination: Secondary | ICD-10-CM | POA: Diagnosis not present

## 2013-12-25 DIAGNOSIS — G20 Parkinson's disease: Secondary | ICD-10-CM | POA: Diagnosis not present

## 2013-12-26 ENCOUNTER — Ambulatory Visit (INDEPENDENT_AMBULATORY_CARE_PROVIDER_SITE_OTHER): Payer: Medicare Other | Admitting: Cardiology

## 2013-12-26 ENCOUNTER — Encounter: Payer: Self-pay | Admitting: Cardiology

## 2013-12-26 VITALS — BP 130/72 | HR 78 | Ht 63.0 in | Wt 147.0 lb

## 2013-12-26 DIAGNOSIS — G20 Parkinson's disease: Secondary | ICD-10-CM | POA: Diagnosis not present

## 2013-12-26 DIAGNOSIS — Z23 Encounter for immunization: Secondary | ICD-10-CM | POA: Diagnosis not present

## 2013-12-26 DIAGNOSIS — M545 Low back pain: Secondary | ICD-10-CM | POA: Diagnosis not present

## 2013-12-26 DIAGNOSIS — I4891 Unspecified atrial fibrillation: Secondary | ICD-10-CM

## 2013-12-26 DIAGNOSIS — I10 Essential (primary) hypertension: Secondary | ICD-10-CM

## 2013-12-26 DIAGNOSIS — E785 Hyperlipidemia, unspecified: Secondary | ICD-10-CM | POA: Diagnosis not present

## 2013-12-26 DIAGNOSIS — R488 Other symbolic dysfunctions: Secondary | ICD-10-CM | POA: Diagnosis not present

## 2013-12-26 DIAGNOSIS — R471 Dysarthria and anarthria: Secondary | ICD-10-CM | POA: Diagnosis not present

## 2013-12-26 DIAGNOSIS — R278 Other lack of coordination: Secondary | ICD-10-CM | POA: Diagnosis not present

## 2013-12-26 NOTE — Assessment & Plan Note (Signed)
Management per primary care. 

## 2013-12-26 NOTE — Progress Notes (Signed)
HPI: 80 year old female for followup of atrial fibrillation. Note she does have dementia. Recently admitted with back pain and noted to be in asymptomatic atrial fibrillation. Echocardiogram September 2015 showed normal LV function. There was moderate left atrial enlargement. TSH 1.470. Patient did have a 6 second calls with Cardizem. Given age, Parkinson's and dementia conservative measures felt indicated and patient's son was in agreement. Ultimately she remained in sinus rhythm. Since she was discharged, She apparently has not had dyspnea, chest pain, or syncope. She has difficulty verbalizing because of Parkinson's.  Current Outpatient Prescriptions  Medication Sig Dispense Refill  . acetaminophen (TYLENOL) 325 MG tablet Take 650 mg by mouth. Take 2 three times daily as needed      . aspirin 81 MG tablet Take 81 mg by mouth daily.      . bisacodyl (DULCOLAX) 5 MG EC tablet Take 1 tablet (5 mg total) by mouth 2 (two) times daily.  60 tablet  0  . carbidopa-levodopa (SINEMET CR) 50-200 MG per tablet Take 1 tablet by mouth at bedtime.      . carbidopa-levodopa (SINEMET IR) 25-100 MG per tablet Take 1 1/2 tablet 4 times a day, at 6 am, 10 am, 2 pm, and 6 pm  180 tablet  5  . diclofenac sodium (VOLTAREN) 1 % GEL Apply 2 g topically. Use 2 grams to each shoulder three times daily to reduce pain      . hydrALAZINE (APRESOLINE) 10 MG tablet Take 25 mg by mouth 2 (two) times daily.      . Magnesium Hydroxide (MILK OF MAGNESIA PO) Take 45 mLs by mouth once as needed (constipation).      . polyethylene glycol (MIRALAX / GLYCOLAX) packet Take 17 g by mouth 2 (two) times daily.  100 each  0   No current facility-administered medications for this visit.     Past Medical History  Diagnosis Date  . Parkinson's disease   . Hypertension   . Arthritis     osteoarthritis  . Scoliosis   . Memory loss 06/09/2012  . Aortic valve disorders 04/11/2012  . Dementia in conditions classified elsewhere  without behavioral disturbance 04/07/2012  . Hyperlipidemia   . Osteoarthrosis, unspecified whether generalized or localized, unspecified site   . Insomnia, unspecified 03/20/2012  . Fracture closed, humerus 03/20/2012  . Other closed fractures of distal end of radius (alone) 03/20/2012  . Transient ischemic attack (TIA), and cerebral infarction without residual deficits(V12.54)   . Unspecified constipation   . Debility, unspecified 03/18/2012  . Anxiety   . Generalized anxiety disorder   . Urinary incontinence 07/05/2013    Re: PD  . Atrial fibrillation     Past Surgical History  Procedure Laterality Date  . Breast surgery      benign lumpectomy  . Cholecystectomy      History   Social History  . Marital Status: Widowed    Spouse Name: N/A    Number of Children: 2  . Years of Education: college   Occupational History  . retired Armed forces operational officer    Social History Main Topics  . Smoking status: Never Smoker   . Smokeless tobacco: Never Used  . Alcohol Use: No  . Drug Use: No  . Sexual Activity: No   Other Topics Concern  . Not on file   Social History Narrative   Widowed 02/2012 (after 57 yr marriage). [place], understands English fairly well, speaks English poorly. Occupation: Retired Armed forces operational officer. Moved  form [place] to [facility] 03/2012,     Lives at [facility],  [place]. Has 24 hour caregivers.   Advanced Directives:  DNR, MOST (05/2012)        ROS: no fevers or chills, productive cough, hemoptysis, dysphasia, odynophagia, melena, hematochezia, dysuria, hematuria, rash, seizure activity, orthopnea, PND, pedal edema, claudication. Remaining systems are negative.  Physical Exam: Well-developed Chronically ill appearing in no acute distress.  Skin is warm and dry.  HEENT is normal.  Neck is supple.  Chest is clear to auscultation with normal expansion.  Cardiovascular exam is regular rate and rhythm.  Abdominal exam nontender or  distended. No masses palpated. Extremities show no edema. neuro Consistent with Parkinson's. ECG Sinus rhythm at a rate of 78. Left axis deviation. Left ventricular hypertrophy. Nonspecific T-wave changes.

## 2013-12-26 NOTE — Patient Instructions (Signed)
Your physician recommends that you schedule a follow-up appointment in: AS NEEDED  

## 2013-12-26 NOTE — Assessment & Plan Note (Signed)
Blood pressure controlled. Continue present medications. 

## 2013-12-26 NOTE — Assessment & Plan Note (Signed)
Patient remains in sinus rhythm. Long discussion with patient's daughter who is a Engineer, drilling. They want only conservative measures. We will avoid AV nodal blocking agents given history of tachybradycardia syndrome. Continue aspirin. The risk of anticoagulation is felt to be higher than the benefit. They understand higher risk of stroke off of anticoagulation but given her Parkinson's an unsteady gait we feel the risk is too high.

## 2013-12-27 DIAGNOSIS — M545 Low back pain: Secondary | ICD-10-CM | POA: Diagnosis not present

## 2013-12-27 DIAGNOSIS — R471 Dysarthria and anarthria: Secondary | ICD-10-CM | POA: Diagnosis not present

## 2013-12-27 DIAGNOSIS — R488 Other symbolic dysfunctions: Secondary | ICD-10-CM | POA: Diagnosis not present

## 2013-12-27 DIAGNOSIS — I4891 Unspecified atrial fibrillation: Secondary | ICD-10-CM | POA: Diagnosis not present

## 2013-12-27 DIAGNOSIS — G20 Parkinson's disease: Secondary | ICD-10-CM | POA: Diagnosis not present

## 2013-12-27 DIAGNOSIS — R278 Other lack of coordination: Secondary | ICD-10-CM | POA: Diagnosis not present

## 2013-12-28 DIAGNOSIS — R278 Other lack of coordination: Secondary | ICD-10-CM | POA: Diagnosis not present

## 2013-12-28 DIAGNOSIS — I4891 Unspecified atrial fibrillation: Secondary | ICD-10-CM | POA: Diagnosis not present

## 2013-12-28 DIAGNOSIS — G20 Parkinson's disease: Secondary | ICD-10-CM | POA: Diagnosis not present

## 2013-12-28 DIAGNOSIS — R471 Dysarthria and anarthria: Secondary | ICD-10-CM | POA: Diagnosis not present

## 2013-12-28 DIAGNOSIS — R488 Other symbolic dysfunctions: Secondary | ICD-10-CM | POA: Diagnosis not present

## 2013-12-28 DIAGNOSIS — M545 Low back pain: Secondary | ICD-10-CM | POA: Diagnosis not present

## 2013-12-29 DIAGNOSIS — R471 Dysarthria and anarthria: Secondary | ICD-10-CM | POA: Diagnosis not present

## 2013-12-29 DIAGNOSIS — I4891 Unspecified atrial fibrillation: Secondary | ICD-10-CM | POA: Diagnosis not present

## 2013-12-29 DIAGNOSIS — G20 Parkinson's disease: Secondary | ICD-10-CM | POA: Diagnosis not present

## 2013-12-29 DIAGNOSIS — R488 Other symbolic dysfunctions: Secondary | ICD-10-CM | POA: Diagnosis not present

## 2013-12-29 DIAGNOSIS — M545 Low back pain: Secondary | ICD-10-CM | POA: Diagnosis not present

## 2013-12-29 DIAGNOSIS — R278 Other lack of coordination: Secondary | ICD-10-CM | POA: Diagnosis not present

## 2014-01-01 DIAGNOSIS — R278 Other lack of coordination: Secondary | ICD-10-CM | POA: Diagnosis not present

## 2014-01-01 DIAGNOSIS — M545 Low back pain: Secondary | ICD-10-CM | POA: Diagnosis not present

## 2014-01-01 DIAGNOSIS — G20 Parkinson's disease: Secondary | ICD-10-CM | POA: Diagnosis not present

## 2014-01-01 DIAGNOSIS — R471 Dysarthria and anarthria: Secondary | ICD-10-CM | POA: Diagnosis not present

## 2014-01-01 DIAGNOSIS — R488 Other symbolic dysfunctions: Secondary | ICD-10-CM | POA: Diagnosis not present

## 2014-01-01 DIAGNOSIS — I4891 Unspecified atrial fibrillation: Secondary | ICD-10-CM | POA: Diagnosis not present

## 2014-01-02 DIAGNOSIS — M545 Low back pain: Secondary | ICD-10-CM | POA: Diagnosis not present

## 2014-01-02 DIAGNOSIS — G20 Parkinson's disease: Secondary | ICD-10-CM | POA: Diagnosis not present

## 2014-01-02 DIAGNOSIS — R488 Other symbolic dysfunctions: Secondary | ICD-10-CM | POA: Diagnosis not present

## 2014-01-02 DIAGNOSIS — R471 Dysarthria and anarthria: Secondary | ICD-10-CM | POA: Diagnosis not present

## 2014-01-02 DIAGNOSIS — I4891 Unspecified atrial fibrillation: Secondary | ICD-10-CM | POA: Diagnosis not present

## 2014-01-02 DIAGNOSIS — R278 Other lack of coordination: Secondary | ICD-10-CM | POA: Diagnosis not present

## 2014-01-03 DIAGNOSIS — R278 Other lack of coordination: Secondary | ICD-10-CM | POA: Diagnosis not present

## 2014-01-03 DIAGNOSIS — I4891 Unspecified atrial fibrillation: Secondary | ICD-10-CM | POA: Diagnosis not present

## 2014-01-03 DIAGNOSIS — G20 Parkinson's disease: Secondary | ICD-10-CM | POA: Diagnosis not present

## 2014-01-03 DIAGNOSIS — R471 Dysarthria and anarthria: Secondary | ICD-10-CM | POA: Diagnosis not present

## 2014-01-03 DIAGNOSIS — R488 Other symbolic dysfunctions: Secondary | ICD-10-CM | POA: Diagnosis not present

## 2014-01-03 DIAGNOSIS — M545 Low back pain: Secondary | ICD-10-CM | POA: Diagnosis not present

## 2014-01-04 DIAGNOSIS — R488 Other symbolic dysfunctions: Secondary | ICD-10-CM | POA: Diagnosis not present

## 2014-01-04 DIAGNOSIS — G20 Parkinson's disease: Secondary | ICD-10-CM | POA: Diagnosis not present

## 2014-01-04 DIAGNOSIS — R278 Other lack of coordination: Secondary | ICD-10-CM | POA: Diagnosis not present

## 2014-01-04 DIAGNOSIS — I4891 Unspecified atrial fibrillation: Secondary | ICD-10-CM | POA: Diagnosis not present

## 2014-01-04 DIAGNOSIS — R471 Dysarthria and anarthria: Secondary | ICD-10-CM | POA: Diagnosis not present

## 2014-01-04 DIAGNOSIS — M545 Low back pain: Secondary | ICD-10-CM | POA: Diagnosis not present

## 2014-01-05 DIAGNOSIS — R471 Dysarthria and anarthria: Secondary | ICD-10-CM | POA: Diagnosis not present

## 2014-01-05 DIAGNOSIS — R278 Other lack of coordination: Secondary | ICD-10-CM | POA: Diagnosis not present

## 2014-01-05 DIAGNOSIS — I4891 Unspecified atrial fibrillation: Secondary | ICD-10-CM | POA: Diagnosis not present

## 2014-01-05 DIAGNOSIS — M545 Low back pain: Secondary | ICD-10-CM | POA: Diagnosis not present

## 2014-01-05 DIAGNOSIS — R488 Other symbolic dysfunctions: Secondary | ICD-10-CM | POA: Diagnosis not present

## 2014-01-05 DIAGNOSIS — G20 Parkinson's disease: Secondary | ICD-10-CM | POA: Diagnosis not present

## 2014-01-08 ENCOUNTER — Ambulatory Visit (INDEPENDENT_AMBULATORY_CARE_PROVIDER_SITE_OTHER): Payer: Medicare Other | Admitting: Nurse Practitioner

## 2014-01-08 ENCOUNTER — Encounter (INDEPENDENT_AMBULATORY_CARE_PROVIDER_SITE_OTHER): Payer: Self-pay

## 2014-01-08 ENCOUNTER — Encounter: Payer: Self-pay | Admitting: Nurse Practitioner

## 2014-01-08 VITALS — BP 135/77 | HR 68

## 2014-01-08 DIAGNOSIS — G20 Parkinson's disease: Secondary | ICD-10-CM | POA: Diagnosis not present

## 2014-01-08 DIAGNOSIS — R471 Dysarthria and anarthria: Secondary | ICD-10-CM | POA: Diagnosis not present

## 2014-01-08 DIAGNOSIS — M545 Low back pain: Secondary | ICD-10-CM | POA: Diagnosis not present

## 2014-01-08 DIAGNOSIS — I4891 Unspecified atrial fibrillation: Secondary | ICD-10-CM | POA: Diagnosis not present

## 2014-01-08 DIAGNOSIS — R278 Other lack of coordination: Secondary | ICD-10-CM | POA: Diagnosis not present

## 2014-01-08 DIAGNOSIS — G20A1 Parkinson's disease without dyskinesia, without mention of fluctuations: Secondary | ICD-10-CM

## 2014-01-08 DIAGNOSIS — R488 Other symbolic dysfunctions: Secondary | ICD-10-CM | POA: Diagnosis not present

## 2014-01-08 NOTE — Patient Instructions (Signed)
We will continue the Sinemet to 1-1/2 pills 4 times a day, namely at 6, 10, 2 PM and 6 PM. Furthermore, for nighttime use I would like to suggest Sinemet CR 50/200 mg strength one pill at bedtime, between 8:30 and 9 PM. Side effects to look out for are lightheadedness, drop in blood pressure, nausea, hallucinations, dyskinesias. She will keep her next scheduled appointment with Dr. Rexene Alberts, on 04/20/14.

## 2014-01-08 NOTE — Progress Notes (Addendum)
PATIENT: Vanessa Mcbride DOB: Feb 11, 1925  REASON FOR VISIT: routine follow up for PD HISTORY FROM: patient, and daughter  HISTORY OF PRESENT ILLNESS: Vanessa Mcbride is a very friendly 80 year old right-handed woman with an underlying medical history of hyperlipidemia, hypertension, osteoarthritis, insomnia, memory loss, anxiety and history of fall with fracture of her left arm in 2014, who presents for followup consultation of her right-sided predominant Parkinson's disease.   She is accompanied by her daughter and caretaker today. After last visit she was increased on her Sinemet to one and half tablets 4 times daily and a nighttime dose of Sinemet CR was added. After last visit in September, she was left standing alone by a caretaker and she fell backwards. She was later hospitalized with back pain and abdominal discomfort. She was hospitalized for 7 days and found to have a fractured vertebrae and severe constipation. During the hospitalization she was noted to have a new onset A. fib with rapid RVR in the 170s which was treated. Conservative treatment was decided on by her daughter and should on daily aspirin for the atrial fibrillation. Unfortunately this was a great setback 4 minutes for Vanessa Mcbride, which resulted in even greater debility. She was forced to move from assisted living into rehabilitation. She has since moved back to assisted living with round-the-clock caregivers, and and her mobility is greatly decreased. She is now a 2 person assist and must be moved with liftt equipment. Her daughter has requested that most unnecessary medications be discontinued at this point. She is basically only taking Sinemet, hydralazine for blood pressure, and Tylenol for back pain and occasional laxative. Her rigidity is greatly increased since last visit. She is now on a pured diet. Her daughter is focusing now on comfort and quality of life.  Last visit 11/07/2013 with Dr. Rexene Alberts:  She is accompanied by one of  her caretakers from [place], [name], and her Daughter [name]) today, whom I meet for the first time today. I last saw her on 04/25/2013, at which time I felt that her symptoms were worse with respect to her walking and her tremors. I suggested a slight increase in her Sinemet to alternating 1-1/2 pills with one pill for a total of 4 doses and a total of 5 pills. Her caretakers reported that she snored and had had some apneic pauses in her breathing. I suggested a sleep study but they suggested that he check with the patient's daughter and call back with regards to doing a sleep study. In the interim, she was seen by our nurse practitioner, [name] on 07/20/2013 at which time she was fairly stable and the medications were kept the same.   Today, her daughter worries about patient's weight gain and that [name]. She has had difficulty maintaining sleep and the melatonin was stopped and amlodipine was stopped recently. Her primary care physician prescribed Belsomra, but her daughter states that she was not comfortable with her mother taking a sleeping pill and it was not started. She gets her first dose of levodopa around 6:30. By the time [name] sees her she has had the medication for about an hour but the patient is still quite stiff first thing in the morning. She is physically more easily exhausted. She has had more trouble walking and is more stiff and more slow. She has to use the bathroom every 2-3 hours each night. She has a 24 hour around caregiver at [employer] Her daughter states that she resigned from her job  last week to the closer to her mother and be with her mother more often. The patient has a son in [place] who is going to come to visit next month for her birthday. She has 4 grandchildren.  I saw her on 12/23/2012, and which time I increased her Sinemet to 4 times daily from tid, as I felt that she had worsened since her previous visit. She reported worse memory, poor sleep, and occasional  muscle jerks. I continued her on 1-1/2 pills for the first dose and one pill each time for the last 3 doses.  I saw her on 09/21/2012, at which time I increased her morning dose of Sinemet to 1-1/2 pills and otherwise she was to continue her midday dose at one pill and her afternoon dose at one pill. Prior to that I saw her on 06/09/2012. I did not make any medication changes in 3/14. Her primary care physician encouraged her to use her walker or cane but she does not like to use them. She holds onto her caretaker.   REVIEW OF SYSTEMS: Full 14 system review of systems performed and notable only for:    ALLERGIES: Allergies  Allergen Reactions  . Iodine Anaphylaxis  . Iohexol      Desc: pt has had contrast in the past (years ago) and it causea anaphylaxis- per pt's daughter.  stephanie davis,rtrct, Onset Date: [id number]     HOME MEDICATIONS: Outpatient Prescriptions Prior to Visit  Medication Sig Dispense Refill  . acetaminophen (TYLENOL) 325 MG tablet Take 650 mg by mouth. Take 2 tablets 2 times daily as needed      . aspirin 81 MG tablet Take 81 mg by mouth daily.      . bisacodyl (DULCOLAX) 5 MG EC tablet Take 1 tablet (5 mg total) by mouth 2 (two) times daily.  60 tablet  0  . carbidopa-levodopa (SINEMET CR) 50-200 MG per tablet Take 1 tablet by mouth at bedtime.      . carbidopa-levodopa (SINEMET IR) 25-100 MG per tablet Take 1 1/2 tablet 4 times a day, at 6 am, 10 am, 2 pm, and 6 pm  180 tablet  5  . diclofenac sodium (VOLTAREN) 1 % GEL Apply 2 g topically. Use 2 grams to each shoulder three times daily to reduce pain      . hydrALAZINE (APRESOLINE) 10 MG tablet Take 25 mg by mouth 2 (two) times daily.      . Magnesium Hydroxide (MILK OF MAGNESIA PO) Take 45 mLs by mouth once as needed (constipation).      . polyethylene glycol (MIRALAX / GLYCOLAX) packet Take 17 g by mouth 2 (two) times daily.  100 each  0   No facility-administered medications prior to visit.    PHYSICAL  EXAM Filed Vitals:   01/08/14 1329  BP: 135/77  Pulse: 68   There is no weight on file to calculate BMI. No exam data present No flowsheet data found.  No flowsheet data found.  General Examination: The patient is a very pleasant 80 y.o. female in no acute distress. She appears frail and has gained weight.  HEENT exam: Normocephalic, atraumatic, moderate facial masking is noted and she keeps her mouth open more. She has no lip, neck or jaw tremor. Neck tone is moderately elevated. She has normal range of motion. Hearing is intact. Speech is otherwise clear. Oropharynx exam reveals no abnormalities. Pupils are reactive to light and extraocular tracking is fair with moderate saccadic breakdown of smooth  pursuit.  Chest is clear to auscultation with no wheezing or rhonchi.  Heart sounds are normal without murmurs, rubs or gallops.  Abdomen is soft, nontender. Bowel sounds are appreciated.  She has no pitting edema today.  Neurologically: Mental status: The patient is awake, alert and oriented to self, and circumstance. She is able to answer questions and there is moderate bradyphrenia. Cranial nerves are as described under HEENT exam. Motor exam: She a global strength of 4+/5. Her left arm mobility is better. Tone is increased from last time and bradykinesia is worse. There is moderate cogwheeling noted in the right upper extremity and a little milder on the L. She has no tremor today. She has significant slowness, worse than before. She has severe impairment of finger taps on the R and moderate to severe impairment on the L. Same with hand movements. She has severe impairment of foot taps on the R and moderately so on the L. She is able to stand up from the seated position with maximum assistance. She walks very little with assistance. She has a moderately stooped posture. She walks with start hesitation and mild freezing and needs assistance by holding onto [place]. She turns with much more  difficulty than before. Sensory exam is intact to light touch throughout.   DIAGNOSTIC DATA (LABS, IMAGING, TESTING) - I reviewed patient records, labs, notes, testing and imaging myself where available.  Lab Results  Component Value Date   WBC 10.4 11/21/2013   HGB 14.4 11/21/2013   HCT 43.5 11/21/2013   MCV 96.5 11/21/2013   PLT 212 11/21/2013      Component Value Date/Time   NA 138 11/23/2013 0249   NA 140 03/28/2013   K 4.1 11/23/2013 0249   CL 100 11/23/2013 0249   CO2 28 11/23/2013 0249   GLUCOSE 106* 11/23/2013 0249   BUN 17 11/23/2013 0249   BUN 8 03/28/2013   CREATININE 0.35* 11/23/2013 0249   CREATININE 0.5 03/28/2013   CALCIUM 8.3* 11/23/2013 0249   PROT 6.7 11/21/2013 0605   ALBUMIN 3.4* 11/21/2013 0605   AST 9 11/21/2013 0605   ALT <5 11/21/2013 0605   ALKPHOS 55 11/21/2013 0605   BILITOT 1.0 11/21/2013 0605   GFRNONAA >90 11/23/2013 0249   GFRAA >90 11/23/2013 0249   Lab Results  Component Value Date   CHOL 138 03/28/2013   HDL 76* 03/28/2013   LDLCALC 39 03/28/2013   TRIG 113 03/28/2013   Lab Results  Component Value Date   TSH 1.470 11/21/2013    ASSESSMENT AND PLAN: In summary, Vanessa Mcbride is a very pleasant 80 year old lady with advanced Parkinson's disease, right sided predominant with overall moderate to severe findings and continuous progression of her symptoms. She has certainly progressed and her slowness, stiffness and her gait dysfunction. Her balance is worse as well. I had a long chat with the patient and particularly her daughter regarding her advanced symptoms and limitations because of the age factor.  At last visit, Dr. Rexene Alberts increased the Sinemet just a little bit more to see if we can help her with her mobility. Better mobility and less freezing and less stiffness would help her walk a little bit more with assistance. It has not seem to help, and unfortunately due to recent hospital admission she is even further debilitated. We will continue the Sinemet to 1-1/2 pills 4 times a day, namely at 6,  10, 2 PM and 6 PM. Furthermore, for nighttime use I would like to suggest Sinemet  CR 50/200 mg strength one pill at bedtime, between 8:30 and 9 PM. I think we should focus on improving her mobility at this time and ultimately her quality of life. The patient, daughter and her caretaker were in agreement.  She will keep her next scheduled appointment with Dr. Rexene Alberts, on 04/20/14.  They were in agreement. I answered all their questions today. I filled out her paperwork from her assisted living facility as well as provided them with written instructions.  Rudi Rummage Terran Hollenkamp, MSN, FNP-BC, A/GNP-C 01/08/2014, 2:08 PM [facility] [address] [place], [place] [address] [phone]  Note: This document was prepared with digital dictation and possible smart phrase technology. Any transcriptional errors that result from this process are unintentional.   I reviewed the above note and documentation by the Nurse Practitioner and agree with the history, physical exam, assessment and plan as outlined above. I was immediately available for face-to-face consultation and also spoke with the patient, briefly examined her and talked to the patient's daughter and the patient regarding our planned. They voiced agreement.  Star Age, MD, PhD [facility])

## 2014-01-09 DIAGNOSIS — R488 Other symbolic dysfunctions: Secondary | ICD-10-CM | POA: Diagnosis not present

## 2014-01-09 DIAGNOSIS — M545 Low back pain: Secondary | ICD-10-CM | POA: Diagnosis not present

## 2014-01-09 DIAGNOSIS — R471 Dysarthria and anarthria: Secondary | ICD-10-CM | POA: Diagnosis not present

## 2014-01-09 DIAGNOSIS — R278 Other lack of coordination: Secondary | ICD-10-CM | POA: Diagnosis not present

## 2014-01-09 DIAGNOSIS — I4891 Unspecified atrial fibrillation: Secondary | ICD-10-CM | POA: Diagnosis not present

## 2014-01-09 DIAGNOSIS — G20 Parkinson's disease: Secondary | ICD-10-CM | POA: Diagnosis not present

## 2014-01-10 ENCOUNTER — Non-Acute Institutional Stay: Payer: Medicare Other | Admitting: Nurse Practitioner

## 2014-01-10 ENCOUNTER — Encounter: Payer: Self-pay | Admitting: Nurse Practitioner

## 2014-01-10 VITALS — BP 122/68 | HR 60 | Temp 97.5°F

## 2014-01-10 DIAGNOSIS — K5900 Constipation, unspecified: Secondary | ICD-10-CM

## 2014-01-10 DIAGNOSIS — M1990 Unspecified osteoarthritis, unspecified site: Secondary | ICD-10-CM

## 2014-01-10 DIAGNOSIS — G20 Parkinson's disease: Secondary | ICD-10-CM

## 2014-01-10 DIAGNOSIS — I10 Essential (primary) hypertension: Secondary | ICD-10-CM | POA: Diagnosis not present

## 2014-01-10 DIAGNOSIS — F0280 Dementia in other diseases classified elsewhere without behavioral disturbance: Secondary | ICD-10-CM

## 2014-01-10 DIAGNOSIS — I4891 Unspecified atrial fibrillation: Secondary | ICD-10-CM | POA: Diagnosis not present

## 2014-01-10 DIAGNOSIS — S22009A Unspecified fracture of unspecified thoracic vertebra, initial encounter for closed fracture: Secondary | ICD-10-CM | POA: Diagnosis not present

## 2014-01-10 DIAGNOSIS — M546 Pain in thoracic spine: Secondary | ICD-10-CM | POA: Diagnosis not present

## 2014-01-10 DIAGNOSIS — M6281 Muscle weakness (generalized): Secondary | ICD-10-CM | POA: Diagnosis not present

## 2014-01-10 DIAGNOSIS — Z5189 Encounter for other specified aftercare: Secondary | ICD-10-CM | POA: Diagnosis not present

## 2014-01-10 DIAGNOSIS — R278 Other lack of coordination: Secondary | ICD-10-CM | POA: Diagnosis not present

## 2014-01-10 DIAGNOSIS — M545 Low back pain: Secondary | ICD-10-CM | POA: Diagnosis not present

## 2014-01-10 NOTE — Progress Notes (Signed)
Patient ID: Vanessa Mcbride, female   DOB: May 18, 1924, 80 y.o.   MRN: [id number]     Location:  [place] [facility] Service: Clinic     Allergies  Allergen Reactions  . Iodine Anaphylaxis  . Iohexol      Desc: pt has had contrast in the past (years ago) and it causea anaphylaxis- per pt's daughter.  stephanie davis,rtrct, Onset Date: [id number]     Chief Complaint  Patient presents with  . Annual Exam    Comprehensive exam: Parkinsons, dementia, blood pressure    HPI:  Vanessa Mcbride is a 80 y.o. With a pmh of Parkinson's disease; Hypertension; Arthritis; Scoliosis; Aortic valve disorder; Dementia in conditions classified elsewhere without behavioral disturbance; Hyperlipidemia; Osteoarthrosis, who is being seen at [facility] in clinic as an extended visit. Pt was recently hospitalized due to back pain and new onset a fib, then developed bradycardia. Was admitted to rehab for a short time and then went back to assisted living with 24-hour home sitter, 2 sitters from 7-9,11-1,4-6, and 8-10. Eventually goal is to go to skilled care but due to limited availability she is staying in assisted living for now. Here with caregiver, daughter called via cell phone.   Pt speaks limited english and also with her dementia and end stage parkinson she has limited verbal interaction.   Will walk to breakfast lunch and dinner with walker and 2 person assistant. Other wheelchair bound.  Recent diet changes to puree. Eating well 75-100% with all meals, has not had a recent weight. Questions from daughter regarding diet, would like her to have pudding and ice cream.   appt earlier this week with Neurologist, no changes made at this time due to risk vs benefit.   Worsening constipation but has improved with adjustment of medications.   Pain in her back mostly in the morning, no complaints at night, would like to minimized pill burden.    Medications: Patient's Medications  New  Prescriptions   No medications on file  Previous Medications   ACETAMINOPHEN (TYLENOL) 325 MG TABLET    Take 650 mg by mouth. Take 2 tablets 2 times daily as needed   ASPIRIN 81 MG TABLET    Take 81 mg by mouth daily.   BISACODYL (DULCOLAX) 5 MG EC TABLET    Take 1 tablet (5 mg total) by mouth 2 (two) times daily.   CARBIDOPA-LEVODOPA (SINEMET CR) 50-200 MG PER TABLET    Take 1 tablet by mouth at bedtime.   CARBIDOPA-LEVODOPA (SINEMET IR) 25-100 MG PER TABLET    Take 1 1/2 tablet 4 times a day, at 6 am, 10 am, 2 pm, and 6 pm   DICLOFENAC SODIUM (VOLTAREN) 1 % GEL    Apply 2 g topically. Use 2 grams to each shoulder three times daily to reduce pain   HYDRALAZINE (APRESOLINE) 10 MG TABLET    Take 25 mg by mouth 2 (two) times daily.   MAGNESIUM HYDROXIDE (MILK OF MAGNESIA PO)    Take 45 mLs by mouth once as needed (constipation).   POLYETHYLENE GLYCOL (MIRALAX / GLYCOLAX) PACKET    Take 17 g by mouth daily as needed.  Modified Medications   No medications on file  Discontinued Medications   No medications on file     Review of Systems  Unable to obtain due to severe dementia and parkinson's disease- see HPI    Filed Vitals:   01/10/14 1012  BP: 122/68  Pulse: 60  Temp: 97.5 F (36.4 C)  TempSrc: Oral  SpO2: 99%   Body mass index is 0.00 kg/(m^2).  Physical Exam  Constitutional:  Frail elderly female in no acute distress.  HENT:  Head: Normocephalic and atraumatic.  Right Ear: External ear normal.  Left Ear: External ear normal.  Nose: Nose normal.  Eyes: Conjunctivae and EOM are normal. Pupils are equal, round, and reactive to light.  Neck: Normal range of motion. Neck supple. No JVD present. No tracheal deviation present. No thyromegaly present.  Cardiovascular: Normal rate, regular rhythm, normal heart sounds and intact distal pulses.  Exam reveals no gallop and no friction rub.   No murmur heard. Pulmonary/Chest: Effort normal and breath sounds normal. No respiratory  distress. She has no wheezes. She has no rales. She exhibits no tenderness.  Abdominal: Soft. Bowel sounds are normal. She exhibits no distension and no mass. There is no tenderness.  Musculoskeletal:  Moves all 4 extremities, slow movement, rigidity present, no leg edema, weakness present  No pain noted Neurological:  Mild memory loss unable to fully assess due to parkinson's and communication barriers.  Cranial nerves intact. She does have a tremor when arms are extended.  Skin: Skin is warm and dry. No pallor.  Psychiatric: She is alert     Labs reviewed: Admission on 11/20/2013, Discharged on 11/23/2013  Component Date Value Ref Range Status  . Sodium 11/20/2013 136* 137 - 147 mEq/L Final  . Potassium 11/20/2013 3.9  3.7 - 5.3 mEq/L Final  . Chloride 11/20/2013 94* 96 - 112 mEq/L Final  . CO2 11/20/2013 29  19 - 32 mEq/L Final  . Glucose, Bld 11/20/2013 138* 70 - 99 mg/dL Final  . BUN 11/20/2013 12  6 - 23 mg/dL Final  . Creatinine, Ser 11/20/2013 0.46* 0.50 - 1.10 mg/dL Final  . Calcium 11/20/2013 9.6  8.4 - 10.5 mg/dL Final  . GFR calc non Af Amer 11/20/2013 88* >90 mL/min Final  . GFR calc Af Amer 11/20/2013 >90  >90 mL/min Final   Comment: (NOTE)                          The eGFR has been calculated using the CKD EPI equation.                          This calculation has not been validated in all clinical situations.                          eGFR's persistently <90 mL/min signify possible Chronic Kidney                          Disease.  . Anion gap 11/20/2013 13  5 - 15 Final  . WBC 11/20/2013 10.6* 4.0 - 10.5 K/uL Final  . RBC 11/20/2013 4.68  3.87 - 5.11 MIL/uL Final  . Hemoglobin 11/20/2013 14.9  12.0 - 15.0 g/dL Final  . HCT 11/20/2013 44.4  36.0 - 46.0 % Final  . MCV 11/20/2013 94.9  78.0 - 100.0 fL Final  . MCH 11/20/2013 31.8  26.0 - 34.0 pg Final  . MCHC 11/20/2013 33.6  30.0 - 36.0 g/dL Final  . RDW 11/20/2013 12.3  11.5 - 15.5 % Final  . Platelets 11/20/2013  210  150 - 400 K/uL Final  . Color, Urine 11/20/2013 YELLOW  YELLOW Final  .  APPearance 11/20/2013 TURBID* CLEAR Final  . Specific Gravity, Urine 11/20/2013 1.014  1.005 - 1.030 Final  . pH 11/20/2013 8.0  5.0 - 8.0 Final  . Glucose, UA 11/20/2013 NEGATIVE  NEGATIVE mg/dL Final  . Hgb urine dipstick 11/20/2013 NEGATIVE  NEGATIVE Final  . Bilirubin Urine 11/20/2013 NEGATIVE  NEGATIVE Final  . Ketones, ur 11/20/2013 NEGATIVE  NEGATIVE mg/dL Final  . Protein, ur 11/20/2013 30* NEGATIVE mg/dL Final  . Urobilinogen, UA 11/20/2013 0.2  0.0 - 1.0 mg/dL Final  . Nitrite 11/20/2013 NEGATIVE  NEGATIVE Final  . Leukocytes, UA 11/20/2013 NEGATIVE  NEGATIVE Final  . Squamous Epithelial / LPF 11/20/2013 RARE  RARE Final  . WBC, UA 11/20/2013 0-2  <3 WBC/hpf Final  . Bacteria, UA 11/20/2013 RARE  RARE Final  . Casts 11/20/2013 HYALINE CASTS* NEGATIVE Final  . Crystals 11/20/2013 TRIPLE PHOSPHATE CRYSTALS* NEGATIVE Final  . Urine-Other 11/20/2013 AMORPHOUS URATES/PHOSPHATES   Final  . Troponin i, poc 11/21/2013 0.02  0.00 - 0.08 ng/mL Final  . Comment 3 11/21/2013          Final   Comment: Due to the release kinetics of cTnI,                          a negative result within the first hours                          of the onset of symptoms does not rule out                          myocardial infarction with certainty.                          If myocardial infarction is still suspected,                          repeat the test at appropriate intervals.  . Lactic Acid, Venous 11/21/2013 1.2  0.5 - 2.2 mmol/L Final  . Magnesium 11/21/2013 2.5  1.5 - 2.5 mg/dL Final  . Phosphorus 11/21/2013 2.7  2.3 - 4.6 mg/dL Final  . TSH 11/21/2013 1.470  0.350 - 4.500 uIU/mL Final  . Sodium 11/21/2013 138  137 - 147 mEq/L Final  . Potassium 11/21/2013 3.6* 3.7 - 5.3 mEq/L Final  . Chloride 11/21/2013 99  96 - 112 mEq/L Final  . CO2 11/21/2013 27  19 - 32 mEq/L Final  . Glucose, Bld 11/21/2013 147* 70 - 99 mg/dL  Final  . BUN 11/21/2013 12  6 - 23 mg/dL Final  . Creatinine, Ser 11/21/2013 0.41* 0.50 - 1.10 mg/dL Final  . Calcium 11/21/2013 8.9  8.4 - 10.5 mg/dL Final  . Total Protein 11/21/2013 6.7  6.0 - 8.3 g/dL Final  . Albumin 11/21/2013 3.4* 3.5 - 5.2 g/dL Final  . AST 11/21/2013 9  0 - 37 U/L Final  . ALT 11/21/2013 <5  0 - 35 U/L Final  . Alkaline Phosphatase 11/21/2013 55  39 - 117 U/L Final  . Total Bilirubin 11/21/2013 1.0  0.3 - 1.2 mg/dL Final  . GFR calc non Af Amer 11/21/2013 >90  >90 mL/min Final  . GFR calc Af Amer 11/21/2013 >90  >90 mL/min Final   Comment: (NOTE)  The eGFR has been calculated using the CKD EPI equation.                          This calculation has not been validated in all clinical situations.                          eGFR's persistently <90 mL/min signify possible Chronic Kidney                          Disease.  . Anion gap 11/21/2013 12  5 - 15 Final  . WBC 11/21/2013 10.4  4.0 - 10.5 K/uL Final  . RBC 11/21/2013 4.51  3.87 - 5.11 MIL/uL Final  . Hemoglobin 11/21/2013 14.4  12.0 - 15.0 g/dL Final  . HCT 11/21/2013 43.5  36.0 - 46.0 % Final  . MCV 11/21/2013 96.5  78.0 - 100.0 fL Final  . MCH 11/21/2013 31.9  26.0 - 34.0 pg Final  . MCHC 11/21/2013 33.1  30.0 - 36.0 g/dL Final  . RDW 11/21/2013 12.3  11.5 - 15.5 % Final  . Platelets 11/21/2013 212  150 - 400 K/uL Final  . Troponin I 11/21/2013 <0.30  <0.30 ng/mL Final   Comment:                                 Due to the release kinetics of cTnI,                          a negative result within the first hours                          of the onset of symptoms does not rule out                          myocardial infarction with certainty.                          If myocardial infarction is still suspected,                          repeat the test at appropriate intervals.  . Troponin I 11/21/2013 <0.30  <0.30 ng/mL Final   Comment:                                 Due to the  release kinetics of cTnI,                          a negative result within the first hours                          of the onset of symptoms does not rule out                          myocardial infarction with certainty.                          If myocardial infarction is  still suspected,                          repeat the test at appropriate intervals.  . Troponin I 11/21/2013 <0.30  <0.30 ng/mL Final   Comment:                                 Due to the release kinetics of cTnI,                          a negative result within the first hours                          of the onset of symptoms does not rule out                          myocardial infarction with certainty.                          If myocardial infarction is still suspected,                          repeat the test at appropriate intervals.  Marland Kitchen MRSA by PCR 11/21/2013 NEGATIVE  NEGATIVE Final   Comment:                                 The GeneXpert MRSA Assay (FDA                          approved for NASAL specimens                          only), is one component of a                          comprehensive MRSA colonization                          surveillance program. It is not                          intended to diagnose MRSA                          infection nor to guide or                          monitor treatment for                          MRSA infections.  . Pro B Natriuretic peptide (BNP) 11/21/2013 1741.0* 0 - 450 pg/mL Final  . Sodium 11/23/2013 138  137 - 147 mEq/L Final  . Potassium 11/23/2013 4.1  3.7 - 5.3 mEq/L Final  . Chloride 11/23/2013 100  96 - 112 mEq/L Final  . CO2 11/23/2013 28  19 - 32 mEq/L Final  . Glucose, Bld 11/23/2013 106* 70 - 99 mg/dL Final  . BUN 11/23/2013 17  6 -  23 mg/dL Final  . Creatinine, Ser 11/23/2013 0.35* 0.50 - 1.10 mg/dL Final  . Calcium 11/23/2013 8.3* 8.4 - 10.5 mg/dL Final  . GFR calc non Af Amer 11/23/2013 >90  >90 mL/min Final  . GFR calc Af Amer  11/23/2013 >90  >90 mL/min Final   Comment: (NOTE)                          The eGFR has been calculated using the CKD EPI equation.                          This calculation has not been validated in all clinical situations.                          eGFR's persistently <90 mL/min signify possible Chronic Kidney                          Disease.  . Anion gap 11/23/2013 10  5 - 15 Final      Assessment/Plan 1. Essential hypertension -Patient is stable; continue current regimen. Will monitor and change as needed. Will hold BP medication for sbp <100.  2. Constipation, unspecified constipation type Well managed on current regimen   3. Parkinson disease Advanced, continues to follow up with neurology. Now on sinemet 4 times daily and at bedtime.  4. Dementia due to medical condition without behavioral disturbance -without significant changes in cognitive status, functional status cont to decline with parkinson's disease.   5. Osteoarthritis, unspecified osteoarthritis type, unspecified site -to reduce pill burden will only have staff schedule in the am to have additional dosing PRN   Keep follow up with Dr Nyoka Cowden.

## 2014-01-11 DIAGNOSIS — I4891 Unspecified atrial fibrillation: Secondary | ICD-10-CM | POA: Diagnosis not present

## 2014-01-11 DIAGNOSIS — R278 Other lack of coordination: Secondary | ICD-10-CM | POA: Diagnosis not present

## 2014-01-11 DIAGNOSIS — M6281 Muscle weakness (generalized): Secondary | ICD-10-CM | POA: Diagnosis not present

## 2014-01-11 DIAGNOSIS — S22009A Unspecified fracture of unspecified thoracic vertebra, initial encounter for closed fracture: Secondary | ICD-10-CM | POA: Diagnosis not present

## 2014-01-11 DIAGNOSIS — M546 Pain in thoracic spine: Secondary | ICD-10-CM | POA: Diagnosis not present

## 2014-01-11 DIAGNOSIS — M545 Low back pain: Secondary | ICD-10-CM | POA: Diagnosis not present

## 2014-01-12 DIAGNOSIS — M6281 Muscle weakness (generalized): Secondary | ICD-10-CM | POA: Diagnosis not present

## 2014-01-12 DIAGNOSIS — M546 Pain in thoracic spine: Secondary | ICD-10-CM | POA: Diagnosis not present

## 2014-01-12 DIAGNOSIS — S22009A Unspecified fracture of unspecified thoracic vertebra, initial encounter for closed fracture: Secondary | ICD-10-CM | POA: Diagnosis not present

## 2014-01-12 DIAGNOSIS — I4891 Unspecified atrial fibrillation: Secondary | ICD-10-CM | POA: Diagnosis not present

## 2014-01-12 DIAGNOSIS — R278 Other lack of coordination: Secondary | ICD-10-CM | POA: Diagnosis not present

## 2014-01-12 DIAGNOSIS — L578 Other skin changes due to chronic exposure to nonionizing radiation: Secondary | ICD-10-CM | POA: Diagnosis not present

## 2014-01-12 DIAGNOSIS — M545 Low back pain: Secondary | ICD-10-CM | POA: Diagnosis not present

## 2014-01-12 DIAGNOSIS — L821 Other seborrheic keratosis: Secondary | ICD-10-CM | POA: Diagnosis not present

## 2014-01-14 DIAGNOSIS — M546 Pain in thoracic spine: Secondary | ICD-10-CM | POA: Diagnosis not present

## 2014-01-14 DIAGNOSIS — R278 Other lack of coordination: Secondary | ICD-10-CM | POA: Diagnosis not present

## 2014-01-14 DIAGNOSIS — M6281 Muscle weakness (generalized): Secondary | ICD-10-CM | POA: Diagnosis not present

## 2014-01-14 DIAGNOSIS — I4891 Unspecified atrial fibrillation: Secondary | ICD-10-CM | POA: Diagnosis not present

## 2014-01-14 DIAGNOSIS — M545 Low back pain: Secondary | ICD-10-CM | POA: Diagnosis not present

## 2014-01-14 DIAGNOSIS — S22009A Unspecified fracture of unspecified thoracic vertebra, initial encounter for closed fracture: Secondary | ICD-10-CM | POA: Diagnosis not present

## 2014-01-15 DIAGNOSIS — R278 Other lack of coordination: Secondary | ICD-10-CM | POA: Diagnosis not present

## 2014-01-15 DIAGNOSIS — I4891 Unspecified atrial fibrillation: Secondary | ICD-10-CM | POA: Diagnosis not present

## 2014-01-15 DIAGNOSIS — S22009A Unspecified fracture of unspecified thoracic vertebra, initial encounter for closed fracture: Secondary | ICD-10-CM | POA: Diagnosis not present

## 2014-01-15 DIAGNOSIS — M545 Low back pain: Secondary | ICD-10-CM | POA: Diagnosis not present

## 2014-01-15 DIAGNOSIS — M546 Pain in thoracic spine: Secondary | ICD-10-CM | POA: Diagnosis not present

## 2014-01-15 DIAGNOSIS — M6281 Muscle weakness (generalized): Secondary | ICD-10-CM | POA: Diagnosis not present

## 2014-01-16 DIAGNOSIS — S22009A Unspecified fracture of unspecified thoracic vertebra, initial encounter for closed fracture: Secondary | ICD-10-CM | POA: Diagnosis not present

## 2014-01-16 DIAGNOSIS — M6281 Muscle weakness (generalized): Secondary | ICD-10-CM | POA: Diagnosis not present

## 2014-01-16 DIAGNOSIS — I4891 Unspecified atrial fibrillation: Secondary | ICD-10-CM | POA: Diagnosis not present

## 2014-01-16 DIAGNOSIS — M545 Low back pain: Secondary | ICD-10-CM | POA: Diagnosis not present

## 2014-01-16 DIAGNOSIS — M546 Pain in thoracic spine: Secondary | ICD-10-CM | POA: Diagnosis not present

## 2014-01-16 DIAGNOSIS — R278 Other lack of coordination: Secondary | ICD-10-CM | POA: Diagnosis not present

## 2014-01-17 DIAGNOSIS — R278 Other lack of coordination: Secondary | ICD-10-CM | POA: Diagnosis not present

## 2014-01-17 DIAGNOSIS — I4891 Unspecified atrial fibrillation: Secondary | ICD-10-CM | POA: Diagnosis not present

## 2014-01-17 DIAGNOSIS — M546 Pain in thoracic spine: Secondary | ICD-10-CM | POA: Diagnosis not present

## 2014-01-17 DIAGNOSIS — M545 Low back pain: Secondary | ICD-10-CM | POA: Diagnosis not present

## 2014-01-17 DIAGNOSIS — S22009A Unspecified fracture of unspecified thoracic vertebra, initial encounter for closed fracture: Secondary | ICD-10-CM | POA: Diagnosis not present

## 2014-01-17 DIAGNOSIS — M6281 Muscle weakness (generalized): Secondary | ICD-10-CM | POA: Diagnosis not present

## 2014-01-20 DIAGNOSIS — S22009A Unspecified fracture of unspecified thoracic vertebra, initial encounter for closed fracture: Secondary | ICD-10-CM | POA: Diagnosis not present

## 2014-01-20 DIAGNOSIS — M6281 Muscle weakness (generalized): Secondary | ICD-10-CM | POA: Diagnosis not present

## 2014-01-20 DIAGNOSIS — M545 Low back pain: Secondary | ICD-10-CM | POA: Diagnosis not present

## 2014-01-20 DIAGNOSIS — I4891 Unspecified atrial fibrillation: Secondary | ICD-10-CM | POA: Diagnosis not present

## 2014-01-20 DIAGNOSIS — M546 Pain in thoracic spine: Secondary | ICD-10-CM | POA: Diagnosis not present

## 2014-01-20 DIAGNOSIS — R278 Other lack of coordination: Secondary | ICD-10-CM | POA: Diagnosis not present

## 2014-01-23 DIAGNOSIS — R1312 Dysphagia, oropharyngeal phase: Secondary | ICD-10-CM | POA: Diagnosis not present

## 2014-01-23 DIAGNOSIS — M546 Pain in thoracic spine: Secondary | ICD-10-CM | POA: Diagnosis not present

## 2014-01-23 DIAGNOSIS — M545 Low back pain: Secondary | ICD-10-CM | POA: Diagnosis not present

## 2014-01-23 DIAGNOSIS — R278 Other lack of coordination: Secondary | ICD-10-CM | POA: Diagnosis not present

## 2014-01-23 DIAGNOSIS — Z5189 Encounter for other specified aftercare: Secondary | ICD-10-CM | POA: Diagnosis not present

## 2014-01-23 DIAGNOSIS — M6281 Muscle weakness (generalized): Secondary | ICD-10-CM | POA: Diagnosis not present

## 2014-01-23 DIAGNOSIS — I4891 Unspecified atrial fibrillation: Secondary | ICD-10-CM | POA: Diagnosis not present

## 2014-01-23 DIAGNOSIS — G20 Parkinson's disease: Secondary | ICD-10-CM | POA: Diagnosis not present

## 2014-01-23 DIAGNOSIS — S22009A Unspecified fracture of unspecified thoracic vertebra, initial encounter for closed fracture: Secondary | ICD-10-CM | POA: Diagnosis not present

## 2014-01-24 DIAGNOSIS — R278 Other lack of coordination: Secondary | ICD-10-CM | POA: Diagnosis not present

## 2014-01-24 DIAGNOSIS — M6281 Muscle weakness (generalized): Secondary | ICD-10-CM | POA: Diagnosis not present

## 2014-01-24 DIAGNOSIS — I4891 Unspecified atrial fibrillation: Secondary | ICD-10-CM | POA: Diagnosis not present

## 2014-01-24 DIAGNOSIS — M546 Pain in thoracic spine: Secondary | ICD-10-CM | POA: Diagnosis not present

## 2014-01-24 DIAGNOSIS — G20 Parkinson's disease: Secondary | ICD-10-CM | POA: Diagnosis not present

## 2014-01-24 DIAGNOSIS — M545 Low back pain: Secondary | ICD-10-CM | POA: Diagnosis not present

## 2014-01-25 DIAGNOSIS — A491 Streptococcal infection, unspecified site: Secondary | ICD-10-CM | POA: Diagnosis not present

## 2014-01-26 DIAGNOSIS — M545 Low back pain: Secondary | ICD-10-CM | POA: Diagnosis not present

## 2014-01-26 DIAGNOSIS — I4891 Unspecified atrial fibrillation: Secondary | ICD-10-CM | POA: Diagnosis not present

## 2014-01-26 DIAGNOSIS — R278 Other lack of coordination: Secondary | ICD-10-CM | POA: Diagnosis not present

## 2014-01-26 DIAGNOSIS — M6281 Muscle weakness (generalized): Secondary | ICD-10-CM | POA: Diagnosis not present

## 2014-01-26 DIAGNOSIS — M546 Pain in thoracic spine: Secondary | ICD-10-CM | POA: Diagnosis not present

## 2014-01-26 DIAGNOSIS — G20 Parkinson's disease: Secondary | ICD-10-CM | POA: Diagnosis not present

## 2014-01-29 DIAGNOSIS — R278 Other lack of coordination: Secondary | ICD-10-CM | POA: Diagnosis not present

## 2014-01-29 DIAGNOSIS — I4891 Unspecified atrial fibrillation: Secondary | ICD-10-CM | POA: Diagnosis not present

## 2014-01-29 DIAGNOSIS — G20 Parkinson's disease: Secondary | ICD-10-CM | POA: Diagnosis not present

## 2014-01-29 DIAGNOSIS — M545 Low back pain: Secondary | ICD-10-CM | POA: Diagnosis not present

## 2014-01-29 DIAGNOSIS — M6281 Muscle weakness (generalized): Secondary | ICD-10-CM | POA: Diagnosis not present

## 2014-01-29 DIAGNOSIS — M546 Pain in thoracic spine: Secondary | ICD-10-CM | POA: Diagnosis not present

## 2014-01-30 DIAGNOSIS — M545 Low back pain: Secondary | ICD-10-CM | POA: Diagnosis not present

## 2014-01-30 DIAGNOSIS — M6281 Muscle weakness (generalized): Secondary | ICD-10-CM | POA: Diagnosis not present

## 2014-01-30 DIAGNOSIS — R278 Other lack of coordination: Secondary | ICD-10-CM | POA: Diagnosis not present

## 2014-01-30 DIAGNOSIS — I4891 Unspecified atrial fibrillation: Secondary | ICD-10-CM | POA: Diagnosis not present

## 2014-01-30 DIAGNOSIS — M546 Pain in thoracic spine: Secondary | ICD-10-CM | POA: Diagnosis not present

## 2014-01-30 DIAGNOSIS — G20 Parkinson's disease: Secondary | ICD-10-CM | POA: Diagnosis not present

## 2014-01-31 DIAGNOSIS — M6281 Muscle weakness (generalized): Secondary | ICD-10-CM | POA: Diagnosis not present

## 2014-01-31 DIAGNOSIS — R278 Other lack of coordination: Secondary | ICD-10-CM | POA: Diagnosis not present

## 2014-01-31 DIAGNOSIS — M546 Pain in thoracic spine: Secondary | ICD-10-CM | POA: Diagnosis not present

## 2014-01-31 DIAGNOSIS — G20 Parkinson's disease: Secondary | ICD-10-CM | POA: Diagnosis not present

## 2014-01-31 DIAGNOSIS — M545 Low back pain: Secondary | ICD-10-CM | POA: Diagnosis not present

## 2014-01-31 DIAGNOSIS — I4891 Unspecified atrial fibrillation: Secondary | ICD-10-CM | POA: Diagnosis not present

## 2014-02-02 DIAGNOSIS — G20 Parkinson's disease: Secondary | ICD-10-CM | POA: Diagnosis not present

## 2014-02-02 DIAGNOSIS — I4891 Unspecified atrial fibrillation: Secondary | ICD-10-CM | POA: Diagnosis not present

## 2014-02-02 DIAGNOSIS — M546 Pain in thoracic spine: Secondary | ICD-10-CM | POA: Diagnosis not present

## 2014-02-02 DIAGNOSIS — R278 Other lack of coordination: Secondary | ICD-10-CM | POA: Diagnosis not present

## 2014-02-02 DIAGNOSIS — M6281 Muscle weakness (generalized): Secondary | ICD-10-CM | POA: Diagnosis not present

## 2014-02-02 DIAGNOSIS — M545 Low back pain: Secondary | ICD-10-CM | POA: Diagnosis not present

## 2014-02-04 DIAGNOSIS — G20 Parkinson's disease: Secondary | ICD-10-CM | POA: Diagnosis not present

## 2014-02-04 DIAGNOSIS — M6281 Muscle weakness (generalized): Secondary | ICD-10-CM | POA: Diagnosis not present

## 2014-02-04 DIAGNOSIS — M545 Low back pain: Secondary | ICD-10-CM | POA: Diagnosis not present

## 2014-02-04 DIAGNOSIS — I4891 Unspecified atrial fibrillation: Secondary | ICD-10-CM | POA: Diagnosis not present

## 2014-02-04 DIAGNOSIS — R278 Other lack of coordination: Secondary | ICD-10-CM | POA: Diagnosis not present

## 2014-02-04 DIAGNOSIS — M546 Pain in thoracic spine: Secondary | ICD-10-CM | POA: Diagnosis not present

## 2014-02-05 DIAGNOSIS — M545 Low back pain: Secondary | ICD-10-CM | POA: Diagnosis not present

## 2014-02-05 DIAGNOSIS — I4891 Unspecified atrial fibrillation: Secondary | ICD-10-CM | POA: Diagnosis not present

## 2014-02-05 DIAGNOSIS — M6281 Muscle weakness (generalized): Secondary | ICD-10-CM | POA: Diagnosis not present

## 2014-02-05 DIAGNOSIS — G20 Parkinson's disease: Secondary | ICD-10-CM | POA: Diagnosis not present

## 2014-02-05 DIAGNOSIS — M546 Pain in thoracic spine: Secondary | ICD-10-CM | POA: Diagnosis not present

## 2014-02-05 DIAGNOSIS — R278 Other lack of coordination: Secondary | ICD-10-CM | POA: Diagnosis not present

## 2014-02-07 DIAGNOSIS — M546 Pain in thoracic spine: Secondary | ICD-10-CM | POA: Diagnosis not present

## 2014-02-07 DIAGNOSIS — R278 Other lack of coordination: Secondary | ICD-10-CM | POA: Diagnosis not present

## 2014-02-07 DIAGNOSIS — I4891 Unspecified atrial fibrillation: Secondary | ICD-10-CM | POA: Diagnosis not present

## 2014-02-07 DIAGNOSIS — G20 Parkinson's disease: Secondary | ICD-10-CM | POA: Diagnosis not present

## 2014-02-07 DIAGNOSIS — M6281 Muscle weakness (generalized): Secondary | ICD-10-CM | POA: Diagnosis not present

## 2014-02-07 DIAGNOSIS — M545 Low back pain: Secondary | ICD-10-CM | POA: Diagnosis not present

## 2014-02-08 ENCOUNTER — Encounter: Payer: Self-pay | Admitting: Neurology

## 2014-02-09 DIAGNOSIS — M6281 Muscle weakness (generalized): Secondary | ICD-10-CM | POA: Diagnosis not present

## 2014-02-09 DIAGNOSIS — R278 Other lack of coordination: Secondary | ICD-10-CM | POA: Diagnosis not present

## 2014-02-09 DIAGNOSIS — G20 Parkinson's disease: Secondary | ICD-10-CM | POA: Diagnosis not present

## 2014-02-09 DIAGNOSIS — M546 Pain in thoracic spine: Secondary | ICD-10-CM | POA: Diagnosis not present

## 2014-02-09 DIAGNOSIS — M545 Low back pain: Secondary | ICD-10-CM | POA: Diagnosis not present

## 2014-02-09 DIAGNOSIS — I4891 Unspecified atrial fibrillation: Secondary | ICD-10-CM | POA: Diagnosis not present

## 2014-02-11 DIAGNOSIS — G20 Parkinson's disease: Secondary | ICD-10-CM | POA: Diagnosis not present

## 2014-02-11 DIAGNOSIS — M545 Low back pain: Secondary | ICD-10-CM | POA: Diagnosis not present

## 2014-02-11 DIAGNOSIS — R278 Other lack of coordination: Secondary | ICD-10-CM | POA: Diagnosis not present

## 2014-02-11 DIAGNOSIS — M6281 Muscle weakness (generalized): Secondary | ICD-10-CM | POA: Diagnosis not present

## 2014-02-11 DIAGNOSIS — I4891 Unspecified atrial fibrillation: Secondary | ICD-10-CM | POA: Diagnosis not present

## 2014-02-11 DIAGNOSIS — M546 Pain in thoracic spine: Secondary | ICD-10-CM | POA: Diagnosis not present

## 2014-02-12 DIAGNOSIS — I4891 Unspecified atrial fibrillation: Secondary | ICD-10-CM | POA: Diagnosis not present

## 2014-02-12 DIAGNOSIS — R278 Other lack of coordination: Secondary | ICD-10-CM | POA: Diagnosis not present

## 2014-02-12 DIAGNOSIS — M546 Pain in thoracic spine: Secondary | ICD-10-CM | POA: Diagnosis not present

## 2014-02-12 DIAGNOSIS — G20 Parkinson's disease: Secondary | ICD-10-CM | POA: Diagnosis not present

## 2014-02-12 DIAGNOSIS — M545 Low back pain: Secondary | ICD-10-CM | POA: Diagnosis not present

## 2014-02-12 DIAGNOSIS — M6281 Muscle weakness (generalized): Secondary | ICD-10-CM | POA: Diagnosis not present

## 2014-02-13 DIAGNOSIS — G20 Parkinson's disease: Secondary | ICD-10-CM | POA: Diagnosis not present

## 2014-02-13 DIAGNOSIS — M545 Low back pain: Secondary | ICD-10-CM | POA: Diagnosis not present

## 2014-02-13 DIAGNOSIS — I4891 Unspecified atrial fibrillation: Secondary | ICD-10-CM | POA: Diagnosis not present

## 2014-02-13 DIAGNOSIS — R278 Other lack of coordination: Secondary | ICD-10-CM | POA: Diagnosis not present

## 2014-02-13 DIAGNOSIS — M6281 Muscle weakness (generalized): Secondary | ICD-10-CM | POA: Diagnosis not present

## 2014-02-13 DIAGNOSIS — M546 Pain in thoracic spine: Secondary | ICD-10-CM | POA: Diagnosis not present

## 2014-02-14 DIAGNOSIS — M6281 Muscle weakness (generalized): Secondary | ICD-10-CM | POA: Diagnosis not present

## 2014-02-14 DIAGNOSIS — G20 Parkinson's disease: Secondary | ICD-10-CM | POA: Diagnosis not present

## 2014-02-14 DIAGNOSIS — R278 Other lack of coordination: Secondary | ICD-10-CM | POA: Diagnosis not present

## 2014-02-14 DIAGNOSIS — M546 Pain in thoracic spine: Secondary | ICD-10-CM | POA: Diagnosis not present

## 2014-02-14 DIAGNOSIS — M545 Low back pain: Secondary | ICD-10-CM | POA: Diagnosis not present

## 2014-02-14 DIAGNOSIS — I4891 Unspecified atrial fibrillation: Secondary | ICD-10-CM | POA: Diagnosis not present

## 2014-02-19 DIAGNOSIS — I4891 Unspecified atrial fibrillation: Secondary | ICD-10-CM | POA: Diagnosis not present

## 2014-02-19 DIAGNOSIS — M6281 Muscle weakness (generalized): Secondary | ICD-10-CM | POA: Diagnosis not present

## 2014-02-19 DIAGNOSIS — M546 Pain in thoracic spine: Secondary | ICD-10-CM | POA: Diagnosis not present

## 2014-02-19 DIAGNOSIS — M545 Low back pain: Secondary | ICD-10-CM | POA: Diagnosis not present

## 2014-02-19 DIAGNOSIS — R278 Other lack of coordination: Secondary | ICD-10-CM | POA: Diagnosis not present

## 2014-02-19 DIAGNOSIS — G20 Parkinson's disease: Secondary | ICD-10-CM | POA: Diagnosis not present

## 2014-02-20 DIAGNOSIS — R1312 Dysphagia, oropharyngeal phase: Secondary | ICD-10-CM | POA: Diagnosis not present

## 2014-02-20 DIAGNOSIS — M545 Low back pain: Secondary | ICD-10-CM | POA: Diagnosis not present

## 2014-02-20 DIAGNOSIS — S22009A Unspecified fracture of unspecified thoracic vertebra, initial encounter for closed fracture: Secondary | ICD-10-CM | POA: Diagnosis not present

## 2014-02-20 DIAGNOSIS — R278 Other lack of coordination: Secondary | ICD-10-CM | POA: Diagnosis not present

## 2014-02-20 DIAGNOSIS — I4891 Unspecified atrial fibrillation: Secondary | ICD-10-CM | POA: Diagnosis not present

## 2014-02-20 DIAGNOSIS — M546 Pain in thoracic spine: Secondary | ICD-10-CM | POA: Diagnosis not present

## 2014-02-20 DIAGNOSIS — M6281 Muscle weakness (generalized): Secondary | ICD-10-CM | POA: Diagnosis not present

## 2014-02-20 DIAGNOSIS — Z5189 Encounter for other specified aftercare: Secondary | ICD-10-CM | POA: Diagnosis not present

## 2014-02-20 DIAGNOSIS — G20 Parkinson's disease: Secondary | ICD-10-CM | POA: Diagnosis not present

## 2014-02-21 DIAGNOSIS — M545 Low back pain: Secondary | ICD-10-CM | POA: Diagnosis not present

## 2014-02-21 DIAGNOSIS — M546 Pain in thoracic spine: Secondary | ICD-10-CM | POA: Diagnosis not present

## 2014-02-21 DIAGNOSIS — G20 Parkinson's disease: Secondary | ICD-10-CM | POA: Diagnosis not present

## 2014-02-21 DIAGNOSIS — M6281 Muscle weakness (generalized): Secondary | ICD-10-CM | POA: Diagnosis not present

## 2014-02-21 DIAGNOSIS — I4891 Unspecified atrial fibrillation: Secondary | ICD-10-CM | POA: Diagnosis not present

## 2014-02-21 DIAGNOSIS — R278 Other lack of coordination: Secondary | ICD-10-CM | POA: Diagnosis not present

## 2014-02-22 DIAGNOSIS — M545 Low back pain: Secondary | ICD-10-CM | POA: Diagnosis not present

## 2014-02-22 DIAGNOSIS — I4891 Unspecified atrial fibrillation: Secondary | ICD-10-CM | POA: Diagnosis not present

## 2014-02-22 DIAGNOSIS — R278 Other lack of coordination: Secondary | ICD-10-CM | POA: Diagnosis not present

## 2014-02-22 DIAGNOSIS — M6281 Muscle weakness (generalized): Secondary | ICD-10-CM | POA: Diagnosis not present

## 2014-02-22 DIAGNOSIS — G20 Parkinson's disease: Secondary | ICD-10-CM | POA: Diagnosis not present

## 2014-02-22 DIAGNOSIS — M546 Pain in thoracic spine: Secondary | ICD-10-CM | POA: Diagnosis not present

## 2014-02-26 DIAGNOSIS — M546 Pain in thoracic spine: Secondary | ICD-10-CM | POA: Diagnosis not present

## 2014-02-26 DIAGNOSIS — M6281 Muscle weakness (generalized): Secondary | ICD-10-CM | POA: Diagnosis not present

## 2014-02-26 DIAGNOSIS — M545 Low back pain: Secondary | ICD-10-CM | POA: Diagnosis not present

## 2014-02-26 DIAGNOSIS — I4891 Unspecified atrial fibrillation: Secondary | ICD-10-CM | POA: Diagnosis not present

## 2014-02-26 DIAGNOSIS — R278 Other lack of coordination: Secondary | ICD-10-CM | POA: Diagnosis not present

## 2014-02-26 DIAGNOSIS — G20 Parkinson's disease: Secondary | ICD-10-CM | POA: Diagnosis not present

## 2014-02-27 DIAGNOSIS — M546 Pain in thoracic spine: Secondary | ICD-10-CM | POA: Diagnosis not present

## 2014-02-27 DIAGNOSIS — I4891 Unspecified atrial fibrillation: Secondary | ICD-10-CM | POA: Diagnosis not present

## 2014-02-27 DIAGNOSIS — G20 Parkinson's disease: Secondary | ICD-10-CM | POA: Diagnosis not present

## 2014-02-27 DIAGNOSIS — R278 Other lack of coordination: Secondary | ICD-10-CM | POA: Diagnosis not present

## 2014-02-27 DIAGNOSIS — M6281 Muscle weakness (generalized): Secondary | ICD-10-CM | POA: Diagnosis not present

## 2014-02-27 DIAGNOSIS — M545 Low back pain: Secondary | ICD-10-CM | POA: Diagnosis not present

## 2014-03-01 DIAGNOSIS — I4891 Unspecified atrial fibrillation: Secondary | ICD-10-CM | POA: Diagnosis not present

## 2014-03-01 DIAGNOSIS — M6281 Muscle weakness (generalized): Secondary | ICD-10-CM | POA: Diagnosis not present

## 2014-03-01 DIAGNOSIS — M545 Low back pain: Secondary | ICD-10-CM | POA: Diagnosis not present

## 2014-03-01 DIAGNOSIS — M546 Pain in thoracic spine: Secondary | ICD-10-CM | POA: Diagnosis not present

## 2014-03-01 DIAGNOSIS — G20 Parkinson's disease: Secondary | ICD-10-CM | POA: Diagnosis not present

## 2014-03-01 DIAGNOSIS — R278 Other lack of coordination: Secondary | ICD-10-CM | POA: Diagnosis not present

## 2014-03-05 ENCOUNTER — Encounter: Payer: Self-pay | Admitting: Internal Medicine

## 2014-03-05 ENCOUNTER — Non-Acute Institutional Stay: Payer: Medicare Other | Admitting: Internal Medicine

## 2014-03-05 VITALS — BP 148/76 | HR 88

## 2014-03-05 DIAGNOSIS — M6281 Muscle weakness (generalized): Secondary | ICD-10-CM | POA: Diagnosis not present

## 2014-03-05 DIAGNOSIS — I10 Essential (primary) hypertension: Secondary | ICD-10-CM

## 2014-03-05 DIAGNOSIS — M545 Low back pain: Secondary | ICD-10-CM | POA: Diagnosis not present

## 2014-03-05 DIAGNOSIS — F0280 Dementia in other diseases classified elsewhere without behavioral disturbance: Secondary | ICD-10-CM

## 2014-03-05 DIAGNOSIS — I4891 Unspecified atrial fibrillation: Secondary | ICD-10-CM | POA: Diagnosis not present

## 2014-03-05 DIAGNOSIS — M546 Pain in thoracic spine: Secondary | ICD-10-CM | POA: Diagnosis not present

## 2014-03-05 DIAGNOSIS — R278 Other lack of coordination: Secondary | ICD-10-CM | POA: Diagnosis not present

## 2014-03-05 DIAGNOSIS — G20 Parkinson's disease: Secondary | ICD-10-CM

## 2014-03-05 DIAGNOSIS — R269 Unspecified abnormalities of gait and mobility: Secondary | ICD-10-CM

## 2014-03-05 NOTE — Progress Notes (Signed)
Patient ID: Vanessa Mcbride, female   DOB: October 05, 1924, 80 y.o.   MRN: [id number]    [facility] Room Number: [id number]  [facility] of Service: Clinic (12) OFFICE   Allergies  Allergen Reactions  . Iodine Anaphylaxis  . Iohexol      Desc: pt has had contrast in the past (years ago) and it causea anaphylaxis- per pt's daughter.  stephanie davis,rtrct, Onset Date: [id number]     Chief Complaint  Patient presents with  . Medical Management of Chronic Issues    blood pressure, dementia, Parkinson    HPI:  Parkinson disease: Currently on multiple doses of Sinemet daily. Rigidity is increased over the last year. Gait disturbance is also decreased. She can stand but she is unable to walk. Mobility is with wheelchair now.  Essential hypertension: Controlled  Abnormality of gait: Unable to walk although she can stand. Using wheelchair.  Dementia due to medical condition without behavioral disturbance:     Medications: Patient's Medications  New Prescriptions   No medications on file  Previous Medications   ACETAMINOPHEN (TYLENOL) 325 MG TABLET    Take 650 mg by mouth. Take 2 tablets 2 times daily as needed   ASPIRIN 81 MG TABLET    Take 81 mg by mouth daily.   BISACODYL (DULCOLAX) 5 MG EC TABLET    Take 1 tablet (5 mg total) by mouth 2 (two) times daily.   CARBIDOPA-LEVODOPA (SINEMET CR) 50-200 MG PER TABLET    Take 1 tablet by mouth at bedtime.   CARBIDOPA-LEVODOPA (SINEMET IR) 25-100 MG PER TABLET    Take 1 1/2 tablet 4 times a day, at 6 am, 10 am, 2 pm, and 6 pm   DICLOFENAC SODIUM (VOLTAREN) 1 % GEL    Apply 2 g topically. Use 2 grams to each shoulder three times daily to reduce pain   HYDRALAZINE (APRESOLINE) 10 MG TABLET    Take 25 mg by mouth 2 (two) times daily.   MAGNESIUM HYDROXIDE (MILK OF MAGNESIA PO)    Take 45 mLs by mouth once as needed (constipation).   POLYETHYLENE GLYCOL (MIRALAX / GLYCOLAX) PACKET    Take 17 g by mouth daily as needed.   Modified Medications   No medications on file  Discontinued Medications   No medications on file     Review of Systems  Constitutional: Negative for fever, chills, diaphoresis, activity change, appetite change and unexpected weight change.  HENT: Negative.   Eyes: Negative.   Respiratory: Negative.   Cardiovascular: Negative.   Gastrointestinal: Negative.   Endocrine: Negative.   Genitourinary: Negative.   Musculoskeletal:       Hx of fracture of the left radius and of the right humerus.  Skin:       Areas of dryness and redness on the superior aspect of both shoulders. Red cheeks after hot drinks. Lipoma of th left supraclavicular area.  Neurological: Positive for tremors. Negative for seizures, syncope, facial asymmetry, speech difficulty, weakness, numbness and headaches.       Loss of memory. So far this appears to be reasonably mild. Patient denies focal weakness.  Hematological: Negative.   Psychiatric/Behavioral: Positive for sleep disturbance (Using melatonin for rest sometimes).    Filed Vitals:   03/05/14 1358  BP: 148/76  Pulse: 88  SpO2: 99%   There is no weight on file to calculate BMI.  Physical Exam  Constitutional:  Frail elderly female in no acute distress.  HENT:  Head: Normocephalic  and atraumatic.  Right Ear: External ear normal.  Left Ear: External ear normal.  Nose: Nose normal.  Eyes: Conjunctivae and EOM are normal. Pupils are equal, round, and reactive to light.  Neck: Normal range of motion. Neck supple. No JVD present. No tracheal deviation present. No thyromegaly present.  Cardiovascular: Normal rate, regular rhythm, normal heart sounds and intact distal pulses.  Exam reveals no gallop and no friction rub.   No murmur heard. Pulmonary/Chest: Effort normal and breath sounds normal. No respiratory distress. She has no wheezes. She has no rales. She exhibits no tenderness.  Abdominal: Soft. Bowel sounds are normal. She exhibits no distension  and no mass. There is no tenderness.  Musculoskeletal:  Tender in the mid upper half of the right humerus. Tender at the left wrist. No specific swelling or erythema at either of these areas. Gait appears to be slightly unstable. There is some difficulty with raising the right arm at the shoulder.  Lymphadenopathy:    She has no cervical adenopathy.  Neurological:  Mild memory loss. Cranial nerves intact. She does have a tremor when arms are extended. She has Parkinson's disease.  Skin: Skin is warm and dry. No pallor.  Tops of shoulders are slightly red and scaling. Dry skin. Lipoma of the left supraclavicular area.  Psychiatric: She has a normal mood and affect. Her behavior is normal. Thought content normal.     Labs reviewed: No visits with results within 3 Month(s) from this visit. Latest known visit with results is:  Admission on 11/20/2013, Discharged on 11/23/2013  Component Date Value Ref Range Status  . Sodium 11/20/2013 136* 137 - 147 mEq/L Final  . Potassium 11/20/2013 3.9  3.7 - 5.3 mEq/L Final  . Chloride 11/20/2013 94* 96 - 112 mEq/L Final  . CO2 11/20/2013 29  19 - 32 mEq/L Final  . Glucose, Bld 11/20/2013 138* 70 - 99 mg/dL Final  . BUN 11/20/2013 12  6 - 23 mg/dL Final  . Creatinine, Ser 11/20/2013 0.46* 0.50 - 1.10 mg/dL Final  . Calcium 11/20/2013 9.6  8.4 - 10.5 mg/dL Final  . GFR calc non Af Amer 11/20/2013 88* >90 mL/min Final  . GFR calc Af Amer 11/20/2013 >90  >90 mL/min Final   Comment: (NOTE)                          The eGFR has been calculated using the CKD EPI equation.                          This calculation has not been validated in all clinical situations.                          eGFR's persistently <90 mL/min signify possible Chronic Kidney                          Disease.  . Anion gap 11/20/2013 13  5 - 15 Final  . WBC 11/20/2013 10.6* 4.0 - 10.5 K/uL Final  . RBC 11/20/2013 4.68  3.87 - 5.11 MIL/uL Final  . Hemoglobin 11/20/2013 14.9  12.0  - 15.0 g/dL Final  . HCT 11/20/2013 44.4  36.0 - 46.0 % Final  . MCV 11/20/2013 94.9  78.0 - 100.0 fL Final  . MCH 11/20/2013 31.8  26.0 - 34.0 pg Final  . MCHC 11/20/2013 33.6  30.0 - 36.0 g/dL Final  . RDW 11/20/2013 12.3  11.5 - 15.5 % Final  . Platelets 11/20/2013 210  150 - 400 K/uL Final  . Color, Urine 11/20/2013 YELLOW  YELLOW Final  . APPearance 11/20/2013 TURBID* CLEAR Final  . Specific Gravity, Urine 11/20/2013 1.014  1.005 - 1.030 Final  . pH 11/20/2013 8.0  5.0 - 8.0 Final  . Glucose, UA 11/20/2013 NEGATIVE  NEGATIVE mg/dL Final  . Hgb urine dipstick 11/20/2013 NEGATIVE  NEGATIVE Final  . Bilirubin Urine 11/20/2013 NEGATIVE  NEGATIVE Final  . Ketones, ur 11/20/2013 NEGATIVE  NEGATIVE mg/dL Final  . Protein, ur 11/20/2013 30* NEGATIVE mg/dL Final  . Urobilinogen, UA 11/20/2013 0.2  0.0 - 1.0 mg/dL Final  . Nitrite 11/20/2013 NEGATIVE  NEGATIVE Final  . Leukocytes, UA 11/20/2013 NEGATIVE  NEGATIVE Final  . Squamous Epithelial / LPF 11/20/2013 RARE  RARE Final  . WBC, UA 11/20/2013 0-2  <3 WBC/hpf Final  . Bacteria, UA 11/20/2013 RARE  RARE Final  . Casts 11/20/2013 HYALINE CASTS* NEGATIVE Final  . Crystals 11/20/2013 TRIPLE PHOSPHATE CRYSTALS* NEGATIVE Final  . Urine-Other 11/20/2013 AMORPHOUS URATES/PHOSPHATES   Final  . Troponin i, poc 11/21/2013 0.02  0.00 - 0.08 ng/mL Final  . Comment 3 11/21/2013          Final   Comment: Due to the release kinetics of cTnI,                          a negative result within the first hours                          of the onset of symptoms does not rule out                          myocardial infarction with certainty.                          If myocardial infarction is still suspected,                          repeat the test at appropriate intervals.  . Lactic Acid, Venous 11/21/2013 1.2  0.5 - 2.2 mmol/L Final  . Magnesium 11/21/2013 2.5  1.5 - 2.5 mg/dL Final  . Phosphorus 11/21/2013 2.7  2.3 - 4.6 mg/dL Final  . TSH 11/21/2013  1.470  0.350 - 4.500 uIU/mL Final  . Sodium 11/21/2013 138  137 - 147 mEq/L Final  . Potassium 11/21/2013 3.6* 3.7 - 5.3 mEq/L Final  . Chloride 11/21/2013 99  96 - 112 mEq/L Final  . CO2 11/21/2013 27  19 - 32 mEq/L Final  . Glucose, Bld 11/21/2013 147* 70 - 99 mg/dL Final  . BUN 11/21/2013 12  6 - 23 mg/dL Final  . Creatinine, Ser 11/21/2013 0.41* 0.50 - 1.10 mg/dL Final  . Calcium 11/21/2013 8.9  8.4 - 10.5 mg/dL Final  . Total Protein 11/21/2013 6.7  6.0 - 8.3 g/dL Final  . Albumin 11/21/2013 3.4* 3.5 - 5.2 g/dL Final  . AST 11/21/2013 9  0 - 37 U/L Final  . ALT 11/21/2013 <5  0 - 35 U/L Final  . Alkaline Phosphatase 11/21/2013 55  39 - 117 U/L Final  . Total Bilirubin 11/21/2013 1.0  0.3 - 1.2 mg/dL Final  . GFR calc non  Af Amer 11/21/2013 >90  >90 mL/min Final  . GFR calc Af Amer 11/21/2013 >90  >90 mL/min Final   Comment: (NOTE)                          The eGFR has been calculated using the CKD EPI equation.                          This calculation has not been validated in all clinical situations.                          eGFR's persistently <90 mL/min signify possible Chronic Kidney                          Disease.  . Anion gap 11/21/2013 12  5 - 15 Final  . WBC 11/21/2013 10.4  4.0 - 10.5 K/uL Final  . RBC 11/21/2013 4.51  3.87 - 5.11 MIL/uL Final  . Hemoglobin 11/21/2013 14.4  12.0 - 15.0 g/dL Final  . HCT 11/21/2013 43.5  36.0 - 46.0 % Final  . MCV 11/21/2013 96.5  78.0 - 100.0 fL Final  . MCH 11/21/2013 31.9  26.0 - 34.0 pg Final  . MCHC 11/21/2013 33.1  30.0 - 36.0 g/dL Final  . RDW 11/21/2013 12.3  11.5 - 15.5 % Final  . Platelets 11/21/2013 212  150 - 400 K/uL Final  . Troponin I 11/21/2013 <0.30  <0.30 ng/mL Final   Comment:                                 Due to the release kinetics of cTnI,                          a negative result within the first hours                          of the onset of symptoms does not rule out                          myocardial  infarction with certainty.                          If myocardial infarction is still suspected,                          repeat the test at appropriate intervals.  . Troponin I 11/21/2013 <0.30  <0.30 ng/mL Final   Comment:                                 Due to the release kinetics of cTnI,                          a negative result within the first hours                          of the onset of symptoms does not rule out  myocardial infarction with certainty.                          If myocardial infarction is still suspected,                          repeat the test at appropriate intervals.  . Troponin I 11/21/2013 <0.30  <0.30 ng/mL Final   Comment:                                 Due to the release kinetics of cTnI,                          a negative result within the first hours                          of the onset of symptoms does not rule out                          myocardial infarction with certainty.                          If myocardial infarction is still suspected,                          repeat the test at appropriate intervals.  Marland Kitchen MRSA by PCR 11/21/2013 NEGATIVE  NEGATIVE Final   Comment:                                 The GeneXpert MRSA Assay (FDA                          approved for NASAL specimens                          only), is one component of a                          comprehensive MRSA colonization                          surveillance program. It is not                          intended to diagnose MRSA                          infection nor to guide or                          monitor treatment for                          MRSA infections.  . Pro B Natriuretic peptide (BNP) 11/21/2013 1741.0* 0 - 450 pg/mL Final  . Sodium 11/23/2013 138  137 - 147 mEq/L Final  . Potassium 11/23/2013 4.1  3.7 - 5.3 mEq/L Final  . Chloride 11/23/2013 100  96 -  112 mEq/L Final  . CO2 11/23/2013 28  19 - 32 mEq/L Final  . Glucose, Bld  11/23/2013 106* 70 - 99 mg/dL Final  . BUN 11/23/2013 17  6 - 23 mg/dL Final  . Creatinine, Ser 11/23/2013 0.35* 0.50 - 1.10 mg/dL Final  . Calcium 11/23/2013 8.3* 8.4 - 10.5 mg/dL Final  . GFR calc non Af Amer 11/23/2013 >90  >90 mL/min Final  . GFR calc Af Amer 11/23/2013 >90  >90 mL/min Final   Comment: (NOTE)                          The eGFR has been calculated using the CKD EPI equation.                          This calculation has not been validated in all clinical situations.                          eGFR's persistently <90 mL/min signify possible Chronic Kidney                          Disease.  . Anion gap 11/23/2013 10  5 - 15 Final     Assessment/Plan  1. Parkinson disease Gradual worsening despite current medications  2. Essential hypertension Controlled  3. Abnormality of gait Or dependent 10 using wheelchair.  4. Dementia due to medical condition without behavioral disturbance Unchanged

## 2014-03-06 DIAGNOSIS — I4891 Unspecified atrial fibrillation: Secondary | ICD-10-CM | POA: Diagnosis not present

## 2014-03-06 DIAGNOSIS — G20 Parkinson's disease: Secondary | ICD-10-CM | POA: Diagnosis not present

## 2014-03-06 DIAGNOSIS — M546 Pain in thoracic spine: Secondary | ICD-10-CM | POA: Diagnosis not present

## 2014-03-06 DIAGNOSIS — M6281 Muscle weakness (generalized): Secondary | ICD-10-CM | POA: Diagnosis not present

## 2014-03-06 DIAGNOSIS — R278 Other lack of coordination: Secondary | ICD-10-CM | POA: Diagnosis not present

## 2014-03-06 DIAGNOSIS — M545 Low back pain: Secondary | ICD-10-CM | POA: Diagnosis not present

## 2014-03-07 DIAGNOSIS — M546 Pain in thoracic spine: Secondary | ICD-10-CM | POA: Diagnosis not present

## 2014-03-07 DIAGNOSIS — M6281 Muscle weakness (generalized): Secondary | ICD-10-CM | POA: Diagnosis not present

## 2014-03-07 DIAGNOSIS — R278 Other lack of coordination: Secondary | ICD-10-CM | POA: Diagnosis not present

## 2014-03-07 DIAGNOSIS — G20 Parkinson's disease: Secondary | ICD-10-CM | POA: Diagnosis not present

## 2014-03-07 DIAGNOSIS — I4891 Unspecified atrial fibrillation: Secondary | ICD-10-CM | POA: Diagnosis not present

## 2014-03-07 DIAGNOSIS — M545 Low back pain: Secondary | ICD-10-CM | POA: Diagnosis not present

## 2014-03-08 DIAGNOSIS — I4891 Unspecified atrial fibrillation: Secondary | ICD-10-CM | POA: Diagnosis not present

## 2014-03-08 DIAGNOSIS — M546 Pain in thoracic spine: Secondary | ICD-10-CM | POA: Diagnosis not present

## 2014-03-08 DIAGNOSIS — R278 Other lack of coordination: Secondary | ICD-10-CM | POA: Diagnosis not present

## 2014-03-08 DIAGNOSIS — M6281 Muscle weakness (generalized): Secondary | ICD-10-CM | POA: Diagnosis not present

## 2014-03-08 DIAGNOSIS — M545 Low back pain: Secondary | ICD-10-CM | POA: Diagnosis not present

## 2014-03-08 DIAGNOSIS — G20 Parkinson's disease: Secondary | ICD-10-CM | POA: Diagnosis not present

## 2014-03-12 DIAGNOSIS — I4891 Unspecified atrial fibrillation: Secondary | ICD-10-CM | POA: Diagnosis not present

## 2014-03-12 DIAGNOSIS — M6281 Muscle weakness (generalized): Secondary | ICD-10-CM | POA: Diagnosis not present

## 2014-03-12 DIAGNOSIS — G20 Parkinson's disease: Secondary | ICD-10-CM | POA: Diagnosis not present

## 2014-03-12 DIAGNOSIS — M545 Low back pain: Secondary | ICD-10-CM | POA: Diagnosis not present

## 2014-03-12 DIAGNOSIS — M546 Pain in thoracic spine: Secondary | ICD-10-CM | POA: Diagnosis not present

## 2014-03-12 DIAGNOSIS — R278 Other lack of coordination: Secondary | ICD-10-CM | POA: Diagnosis not present

## 2014-03-13 DIAGNOSIS — M546 Pain in thoracic spine: Secondary | ICD-10-CM | POA: Diagnosis not present

## 2014-03-13 DIAGNOSIS — M6281 Muscle weakness (generalized): Secondary | ICD-10-CM | POA: Diagnosis not present

## 2014-03-13 DIAGNOSIS — R278 Other lack of coordination: Secondary | ICD-10-CM | POA: Diagnosis not present

## 2014-03-13 DIAGNOSIS — M545 Low back pain: Secondary | ICD-10-CM | POA: Diagnosis not present

## 2014-03-13 DIAGNOSIS — G20 Parkinson's disease: Secondary | ICD-10-CM | POA: Diagnosis not present

## 2014-03-13 DIAGNOSIS — I4891 Unspecified atrial fibrillation: Secondary | ICD-10-CM | POA: Diagnosis not present

## 2014-03-14 DIAGNOSIS — I4891 Unspecified atrial fibrillation: Secondary | ICD-10-CM | POA: Diagnosis not present

## 2014-03-14 DIAGNOSIS — M6281 Muscle weakness (generalized): Secondary | ICD-10-CM | POA: Diagnosis not present

## 2014-03-14 DIAGNOSIS — G20 Parkinson's disease: Secondary | ICD-10-CM | POA: Diagnosis not present

## 2014-03-14 DIAGNOSIS — M546 Pain in thoracic spine: Secondary | ICD-10-CM | POA: Diagnosis not present

## 2014-03-14 DIAGNOSIS — M545 Low back pain: Secondary | ICD-10-CM | POA: Diagnosis not present

## 2014-03-14 DIAGNOSIS — R278 Other lack of coordination: Secondary | ICD-10-CM | POA: Diagnosis not present

## 2014-03-18 DIAGNOSIS — G20 Parkinson's disease: Secondary | ICD-10-CM | POA: Diagnosis not present

## 2014-03-18 DIAGNOSIS — M546 Pain in thoracic spine: Secondary | ICD-10-CM | POA: Diagnosis not present

## 2014-03-18 DIAGNOSIS — R278 Other lack of coordination: Secondary | ICD-10-CM | POA: Diagnosis not present

## 2014-03-18 DIAGNOSIS — I4891 Unspecified atrial fibrillation: Secondary | ICD-10-CM | POA: Diagnosis not present

## 2014-03-18 DIAGNOSIS — M6281 Muscle weakness (generalized): Secondary | ICD-10-CM | POA: Diagnosis not present

## 2014-03-18 DIAGNOSIS — M545 Low back pain: Secondary | ICD-10-CM | POA: Diagnosis not present

## 2014-03-19 DIAGNOSIS — I4891 Unspecified atrial fibrillation: Secondary | ICD-10-CM | POA: Diagnosis not present

## 2014-03-19 DIAGNOSIS — R278 Other lack of coordination: Secondary | ICD-10-CM | POA: Diagnosis not present

## 2014-03-19 DIAGNOSIS — M6281 Muscle weakness (generalized): Secondary | ICD-10-CM | POA: Diagnosis not present

## 2014-03-19 DIAGNOSIS — M545 Low back pain: Secondary | ICD-10-CM | POA: Diagnosis not present

## 2014-03-19 DIAGNOSIS — M546 Pain in thoracic spine: Secondary | ICD-10-CM | POA: Diagnosis not present

## 2014-03-19 DIAGNOSIS — G20 Parkinson's disease: Secondary | ICD-10-CM | POA: Diagnosis not present

## 2014-03-20 DIAGNOSIS — I4891 Unspecified atrial fibrillation: Secondary | ICD-10-CM | POA: Diagnosis not present

## 2014-03-20 DIAGNOSIS — G20 Parkinson's disease: Secondary | ICD-10-CM | POA: Diagnosis not present

## 2014-03-20 DIAGNOSIS — M545 Low back pain: Secondary | ICD-10-CM | POA: Diagnosis not present

## 2014-03-20 DIAGNOSIS — M6281 Muscle weakness (generalized): Secondary | ICD-10-CM | POA: Diagnosis not present

## 2014-03-20 DIAGNOSIS — M546 Pain in thoracic spine: Secondary | ICD-10-CM | POA: Diagnosis not present

## 2014-03-20 DIAGNOSIS — R278 Other lack of coordination: Secondary | ICD-10-CM | POA: Diagnosis not present

## 2014-03-21 DIAGNOSIS — I4891 Unspecified atrial fibrillation: Secondary | ICD-10-CM | POA: Diagnosis not present

## 2014-03-21 DIAGNOSIS — M545 Low back pain: Secondary | ICD-10-CM | POA: Diagnosis not present

## 2014-03-21 DIAGNOSIS — G20 Parkinson's disease: Secondary | ICD-10-CM | POA: Diagnosis not present

## 2014-03-21 DIAGNOSIS — M6281 Muscle weakness (generalized): Secondary | ICD-10-CM | POA: Diagnosis not present

## 2014-03-21 DIAGNOSIS — M546 Pain in thoracic spine: Secondary | ICD-10-CM | POA: Diagnosis not present

## 2014-03-21 DIAGNOSIS — R278 Other lack of coordination: Secondary | ICD-10-CM | POA: Diagnosis not present

## 2014-03-24 DIAGNOSIS — R1312 Dysphagia, oropharyngeal phase: Secondary | ICD-10-CM | POA: Diagnosis not present

## 2014-03-24 DIAGNOSIS — R278 Other lack of coordination: Secondary | ICD-10-CM | POA: Diagnosis not present

## 2014-03-24 DIAGNOSIS — G20 Parkinson's disease: Secondary | ICD-10-CM | POA: Diagnosis not present

## 2014-03-24 DIAGNOSIS — M546 Pain in thoracic spine: Secondary | ICD-10-CM | POA: Diagnosis not present

## 2014-03-24 DIAGNOSIS — Z5189 Encounter for other specified aftercare: Secondary | ICD-10-CM | POA: Diagnosis not present

## 2014-03-24 DIAGNOSIS — I4891 Unspecified atrial fibrillation: Secondary | ICD-10-CM | POA: Diagnosis not present

## 2014-03-24 DIAGNOSIS — M6281 Muscle weakness (generalized): Secondary | ICD-10-CM | POA: Diagnosis not present

## 2014-03-24 DIAGNOSIS — S22009A Unspecified fracture of unspecified thoracic vertebra, initial encounter for closed fracture: Secondary | ICD-10-CM | POA: Diagnosis not present

## 2014-03-24 DIAGNOSIS — M545 Low back pain: Secondary | ICD-10-CM | POA: Diagnosis not present

## 2014-03-26 DIAGNOSIS — G20 Parkinson's disease: Secondary | ICD-10-CM | POA: Diagnosis not present

## 2014-03-26 DIAGNOSIS — I4891 Unspecified atrial fibrillation: Secondary | ICD-10-CM | POA: Diagnosis not present

## 2014-03-26 DIAGNOSIS — M6281 Muscle weakness (generalized): Secondary | ICD-10-CM | POA: Diagnosis not present

## 2014-03-26 DIAGNOSIS — M546 Pain in thoracic spine: Secondary | ICD-10-CM | POA: Diagnosis not present

## 2014-03-26 DIAGNOSIS — M545 Low back pain: Secondary | ICD-10-CM | POA: Diagnosis not present

## 2014-03-26 DIAGNOSIS — R278 Other lack of coordination: Secondary | ICD-10-CM | POA: Diagnosis not present

## 2014-03-27 DIAGNOSIS — M546 Pain in thoracic spine: Secondary | ICD-10-CM | POA: Diagnosis not present

## 2014-03-27 DIAGNOSIS — M545 Low back pain: Secondary | ICD-10-CM | POA: Diagnosis not present

## 2014-03-27 DIAGNOSIS — G20 Parkinson's disease: Secondary | ICD-10-CM | POA: Diagnosis not present

## 2014-03-27 DIAGNOSIS — M6281 Muscle weakness (generalized): Secondary | ICD-10-CM | POA: Diagnosis not present

## 2014-03-27 DIAGNOSIS — R278 Other lack of coordination: Secondary | ICD-10-CM | POA: Diagnosis not present

## 2014-03-27 DIAGNOSIS — I4891 Unspecified atrial fibrillation: Secondary | ICD-10-CM | POA: Diagnosis not present

## 2014-03-28 DIAGNOSIS — G20 Parkinson's disease: Secondary | ICD-10-CM | POA: Diagnosis not present

## 2014-03-28 DIAGNOSIS — M545 Low back pain: Secondary | ICD-10-CM | POA: Diagnosis not present

## 2014-03-28 DIAGNOSIS — M6281 Muscle weakness (generalized): Secondary | ICD-10-CM | POA: Diagnosis not present

## 2014-03-28 DIAGNOSIS — M546 Pain in thoracic spine: Secondary | ICD-10-CM | POA: Diagnosis not present

## 2014-03-28 DIAGNOSIS — R278 Other lack of coordination: Secondary | ICD-10-CM | POA: Diagnosis not present

## 2014-03-28 DIAGNOSIS — I4891 Unspecified atrial fibrillation: Secondary | ICD-10-CM | POA: Diagnosis not present

## 2014-03-29 DIAGNOSIS — M545 Low back pain: Secondary | ICD-10-CM | POA: Diagnosis not present

## 2014-03-29 DIAGNOSIS — M6281 Muscle weakness (generalized): Secondary | ICD-10-CM | POA: Diagnosis not present

## 2014-03-29 DIAGNOSIS — G20 Parkinson's disease: Secondary | ICD-10-CM | POA: Diagnosis not present

## 2014-03-29 DIAGNOSIS — R278 Other lack of coordination: Secondary | ICD-10-CM | POA: Diagnosis not present

## 2014-03-29 DIAGNOSIS — M546 Pain in thoracic spine: Secondary | ICD-10-CM | POA: Diagnosis not present

## 2014-03-29 DIAGNOSIS — I4891 Unspecified atrial fibrillation: Secondary | ICD-10-CM | POA: Diagnosis not present

## 2014-03-30 DIAGNOSIS — I4891 Unspecified atrial fibrillation: Secondary | ICD-10-CM | POA: Diagnosis not present

## 2014-03-30 DIAGNOSIS — M6281 Muscle weakness (generalized): Secondary | ICD-10-CM | POA: Diagnosis not present

## 2014-03-30 DIAGNOSIS — R278 Other lack of coordination: Secondary | ICD-10-CM | POA: Diagnosis not present

## 2014-03-30 DIAGNOSIS — M546 Pain in thoracic spine: Secondary | ICD-10-CM | POA: Diagnosis not present

## 2014-03-30 DIAGNOSIS — M545 Low back pain: Secondary | ICD-10-CM | POA: Diagnosis not present

## 2014-03-30 DIAGNOSIS — G20 Parkinson's disease: Secondary | ICD-10-CM | POA: Diagnosis not present

## 2014-04-02 DIAGNOSIS — R278 Other lack of coordination: Secondary | ICD-10-CM | POA: Diagnosis not present

## 2014-04-02 DIAGNOSIS — M545 Low back pain: Secondary | ICD-10-CM | POA: Diagnosis not present

## 2014-04-02 DIAGNOSIS — G20 Parkinson's disease: Secondary | ICD-10-CM | POA: Diagnosis not present

## 2014-04-02 DIAGNOSIS — M6281 Muscle weakness (generalized): Secondary | ICD-10-CM | POA: Diagnosis not present

## 2014-04-02 DIAGNOSIS — M546 Pain in thoracic spine: Secondary | ICD-10-CM | POA: Diagnosis not present

## 2014-04-02 DIAGNOSIS — I4891 Unspecified atrial fibrillation: Secondary | ICD-10-CM | POA: Diagnosis not present

## 2014-04-03 DIAGNOSIS — M6281 Muscle weakness (generalized): Secondary | ICD-10-CM | POA: Diagnosis not present

## 2014-04-03 DIAGNOSIS — M545 Low back pain: Secondary | ICD-10-CM | POA: Diagnosis not present

## 2014-04-03 DIAGNOSIS — G20 Parkinson's disease: Secondary | ICD-10-CM | POA: Diagnosis not present

## 2014-04-03 DIAGNOSIS — R278 Other lack of coordination: Secondary | ICD-10-CM | POA: Diagnosis not present

## 2014-04-03 DIAGNOSIS — I4891 Unspecified atrial fibrillation: Secondary | ICD-10-CM | POA: Diagnosis not present

## 2014-04-03 DIAGNOSIS — M546 Pain in thoracic spine: Secondary | ICD-10-CM | POA: Diagnosis not present

## 2014-04-05 DIAGNOSIS — M6281 Muscle weakness (generalized): Secondary | ICD-10-CM | POA: Diagnosis not present

## 2014-04-05 DIAGNOSIS — M546 Pain in thoracic spine: Secondary | ICD-10-CM | POA: Diagnosis not present

## 2014-04-05 DIAGNOSIS — M545 Low back pain: Secondary | ICD-10-CM | POA: Diagnosis not present

## 2014-04-05 DIAGNOSIS — G20 Parkinson's disease: Secondary | ICD-10-CM | POA: Diagnosis not present

## 2014-04-05 DIAGNOSIS — I4891 Unspecified atrial fibrillation: Secondary | ICD-10-CM | POA: Diagnosis not present

## 2014-04-05 DIAGNOSIS — R278 Other lack of coordination: Secondary | ICD-10-CM | POA: Diagnosis not present

## 2014-04-10 DIAGNOSIS — M6281 Muscle weakness (generalized): Secondary | ICD-10-CM | POA: Diagnosis not present

## 2014-04-10 DIAGNOSIS — R278 Other lack of coordination: Secondary | ICD-10-CM | POA: Diagnosis not present

## 2014-04-10 DIAGNOSIS — I4891 Unspecified atrial fibrillation: Secondary | ICD-10-CM | POA: Diagnosis not present

## 2014-04-10 DIAGNOSIS — M546 Pain in thoracic spine: Secondary | ICD-10-CM | POA: Diagnosis not present

## 2014-04-10 DIAGNOSIS — G20 Parkinson's disease: Secondary | ICD-10-CM | POA: Diagnosis not present

## 2014-04-10 DIAGNOSIS — M545 Low back pain: Secondary | ICD-10-CM | POA: Diagnosis not present

## 2014-04-11 ENCOUNTER — Non-Acute Institutional Stay: Payer: Medicare Other | Admitting: Nurse Practitioner

## 2014-04-11 ENCOUNTER — Encounter: Payer: Self-pay | Admitting: Nurse Practitioner

## 2014-04-11 VITALS — BP 132/68 | HR 76 | Temp 97.5°F

## 2014-04-11 DIAGNOSIS — G20 Parkinson's disease: Secondary | ICD-10-CM | POA: Diagnosis not present

## 2014-04-11 DIAGNOSIS — I10 Essential (primary) hypertension: Secondary | ICD-10-CM | POA: Diagnosis not present

## 2014-04-11 NOTE — Progress Notes (Signed)
Patient ID: Vanessa Mcbride, female   DOB: 1924-09-15, 81 y.o.   MRN: [id number]     Location:  [place] of Service: Clinic     Allergies  Allergen Reactions  . Iodine Anaphylaxis  . Iohexol      Desc: pt has had contrast in the past (years ago) and it causea anaphylaxis- per pt's daughter.  stephanie davis,rtrct, Onset Date: [id number]     Chief Complaint  Patient presents with  . Medical Management of Chronic Issues    blood pressure is elevated in evenings    HPI:  Vanessa Mcbride is a 81 year old female who presents today for evaluation of blood pressure. This am Dr. Mariea Clonts was notified that her blood pressures had been running in the 825-053 range systolic, pt was on hydralazine 25 mg twice daily at that time. Her hydralazine was increased to 69m bid.     Her blood pressure at visit today is 132/68 after increase in hydralazine.   Medications: Patient's Medications  New Prescriptions   No medications on file  Previous Medications   ACETAMINOPHEN (TYLENOL) 325 MG TABLET    Take 650 mg by mouth. Take 2 tablets 3 times daily as needed   ACETAMINOPHEN (TYLENOL) 500 MG TABLET    Take 500 mg by mouth. Take twice daily   ASPIRIN 81 MG TABLET    Take 81 mg by mouth daily.   BISACODYL (DULCOLAX) 5 MG EC TABLET    Take 1 tablet (5 mg total) by mouth 2 (two) times daily.   CARBIDOPA-LEVODOPA (SINEMET CR) 50-200 MG PER TABLET    Take 1 tablet by mouth at bedtime.   CARBIDOPA-LEVODOPA (SINEMET IR) 25-100 MG PER TABLET    Take 1 1/2 tablet 4 times a day, at 6 am, 10 am, 2 pm, and 6 pm   DICLOFENAC SODIUM (VOLTAREN) 1 % GEL    Apply 2 g topically. Use 2 grams to each shoulder three times daily to reduce pain   HYDRALAZINE (APRESOLINE) 50 MG TABLET    Take 50 mg by mouth. Take one twice daily hold for systolic <<976  MAGNESIUM HYDROXIDE (MILK OF MAGNESIA PO)    Take 45 mLs by mouth once as needed (constipation).   POLYETHYLENE GLYCOL (MIRALAX / GLYCOLAX) PACKET    Take 17  g by mouth daily as needed.  Modified Medications   No medications on file  Discontinued Medications   HYDRALAZINE (APRESOLINE) 10 MG TABLET    Take 25 mg by mouth 2 (two) times daily.     Review of Systems  Unable to obtain due to severe dementia and parkinson's disease- see HPI    Filed Vitals:   04/11/14 1401  BP: 132/68  Pulse: 76  Temp: 97.5 F (36.4 C)  TempSrc: Oral  SpO2: 98%   Body mass index is 0.00 kg/(m^2).  Physical Exam  Physical Exam  Constitutional: She appears well-developed and well-nourished.  HENT:  Head: Normocephalic and atraumatic.  Cardiovascular: Normal rate, regular rhythm, normal heart sounds and intact distal pulses.   +1 edema in left lower extremity.    Respiratory: Effort normal and breath sounds normal. No respiratory distress. She has no wheezes. She has no rales.  Neurological: She is alert.  Skin: Skin is warm and dry.    Labs reviewed: No visits with results within 3 Month(s) from this visit. Latest known visit with results is:  Admission on 11/20/2013, Discharged on 11/23/2013  Component Date Value  Ref Range Status  . Sodium 11/20/2013 136* 137 - 147 mEq/L Final  . Potassium 11/20/2013 3.9  3.7 - 5.3 mEq/L Final  . Chloride 11/20/2013 94* 96 - 112 mEq/L Final  . CO2 11/20/2013 29  19 - 32 mEq/L Final  . Glucose, Bld 11/20/2013 138* 70 - 99 mg/dL Final  . BUN 11/20/2013 12  6 - 23 mg/dL Final  . Creatinine, Ser 11/20/2013 0.46* 0.50 - 1.10 mg/dL Final  . Calcium 11/20/2013 9.6  8.4 - 10.5 mg/dL Final  . GFR calc non Af Amer 11/20/2013 88* >90 mL/min Final  . GFR calc Af Amer 11/20/2013 >90  >90 mL/min Final   Comment: (NOTE)                          The eGFR has been calculated using the CKD EPI equation.                          This calculation has not been validated in all clinical situations.                          eGFR's persistently <90 mL/min signify possible Chronic Kidney                          Disease.  . Anion  gap 11/20/2013 13  5 - 15 Final  . WBC 11/20/2013 10.6* 4.0 - 10.5 K/uL Final  . RBC 11/20/2013 4.68  3.87 - 5.11 MIL/uL Final  . Hemoglobin 11/20/2013 14.9  12.0 - 15.0 g/dL Final  . HCT 11/20/2013 44.4  36.0 - 46.0 % Final  . MCV 11/20/2013 94.9  78.0 - 100.0 fL Final  . MCH 11/20/2013 31.8  26.0 - 34.0 pg Final  . MCHC 11/20/2013 33.6  30.0 - 36.0 g/dL Final  . RDW 11/20/2013 12.3  11.5 - 15.5 % Final  . Platelets 11/20/2013 210  150 - 400 K/uL Final  . Color, Urine 11/20/2013 YELLOW  YELLOW Final  . APPearance 11/20/2013 TURBID* CLEAR Final  . Specific Gravity, Urine 11/20/2013 1.014  1.005 - 1.030 Final  . pH 11/20/2013 8.0  5.0 - 8.0 Final  . Glucose, UA 11/20/2013 NEGATIVE  NEGATIVE mg/dL Final  . Hgb urine dipstick 11/20/2013 NEGATIVE  NEGATIVE Final  . Bilirubin Urine 11/20/2013 NEGATIVE  NEGATIVE Final  . Ketones, ur 11/20/2013 NEGATIVE  NEGATIVE mg/dL Final  . Protein, ur 11/20/2013 30* NEGATIVE mg/dL Final  . Urobilinogen, UA 11/20/2013 0.2  0.0 - 1.0 mg/dL Final  . Nitrite 11/20/2013 NEGATIVE  NEGATIVE Final  . Leukocytes, UA 11/20/2013 NEGATIVE  NEGATIVE Final  . Squamous Epithelial / LPF 11/20/2013 RARE  RARE Final  . WBC, UA 11/20/2013 0-2  <3 WBC/hpf Final  . Bacteria, UA 11/20/2013 RARE  RARE Final  . Casts 11/20/2013 HYALINE CASTS* NEGATIVE Final  . Crystals 11/20/2013 TRIPLE PHOSPHATE CRYSTALS* NEGATIVE Final  . Urine-Other 11/20/2013 AMORPHOUS URATES/PHOSPHATES   Final  . Troponin i, poc 11/21/2013 0.02  0.00 - 0.08 ng/mL Final  . Comment 3 11/21/2013          Final   Comment: Due to the release kinetics of cTnI,                          a negative result within the first hours  of the onset of symptoms does not rule out                          myocardial infarction with certainty.                          If myocardial infarction is still suspected,                          repeat the test at appropriate intervals.  . Lactic Acid,  Venous 11/21/2013 1.2  0.5 - 2.2 mmol/L Final  . Magnesium 11/21/2013 2.5  1.5 - 2.5 mg/dL Final  . Phosphorus 11/21/2013 2.7  2.3 - 4.6 mg/dL Final  . TSH 11/21/2013 1.470  0.350 - 4.500 uIU/mL Final  . Sodium 11/21/2013 138  137 - 147 mEq/L Final  . Potassium 11/21/2013 3.6* 3.7 - 5.3 mEq/L Final  . Chloride 11/21/2013 99  96 - 112 mEq/L Final  . CO2 11/21/2013 27  19 - 32 mEq/L Final  . Glucose, Bld 11/21/2013 147* 70 - 99 mg/dL Final  . BUN 11/21/2013 12  6 - 23 mg/dL Final  . Creatinine, Ser 11/21/2013 0.41* 0.50 - 1.10 mg/dL Final  . Calcium 11/21/2013 8.9  8.4 - 10.5 mg/dL Final  . Total Protein 11/21/2013 6.7  6.0 - 8.3 g/dL Final  . Albumin 11/21/2013 3.4* 3.5 - 5.2 g/dL Final  . AST 11/21/2013 9  0 - 37 U/L Final  . ALT 11/21/2013 <5  0 - 35 U/L Final  . Alkaline Phosphatase 11/21/2013 55  39 - 117 U/L Final  . Total Bilirubin 11/21/2013 1.0  0.3 - 1.2 mg/dL Final  . GFR calc non Af Amer 11/21/2013 >90  >90 mL/min Final  . GFR calc Af Amer 11/21/2013 >90  >90 mL/min Final   Comment: (NOTE)                          The eGFR has been calculated using the CKD EPI equation.                          This calculation has not been validated in all clinical situations.                          eGFR's persistently <90 mL/min signify possible Chronic Kidney                          Disease.  . Anion gap 11/21/2013 12  5 - 15 Final  . WBC 11/21/2013 10.4  4.0 - 10.5 K/uL Final  . RBC 11/21/2013 4.51  3.87 - 5.11 MIL/uL Final  . Hemoglobin 11/21/2013 14.4  12.0 - 15.0 g/dL Final  . HCT 11/21/2013 43.5  36.0 - 46.0 % Final  . MCV 11/21/2013 96.5  78.0 - 100.0 fL Final  . MCH 11/21/2013 31.9  26.0 - 34.0 pg Final  . MCHC 11/21/2013 33.1  30.0 - 36.0 g/dL Final  . RDW 11/21/2013 12.3  11.5 - 15.5 % Final  . Platelets 11/21/2013 212  150 - 400 K/uL Final  . Troponin I 11/21/2013 <0.30  <0.30 ng/mL Final   Comment:  Due to the release kinetics of cTnI,                           a negative result within the first hours                          of the onset of symptoms does not rule out                          myocardial infarction with certainty.                          If myocardial infarction is still suspected,                          repeat the test at appropriate intervals.  . Troponin I 11/21/2013 <0.30  <0.30 ng/mL Final   Comment:                                 Due to the release kinetics of cTnI,                          a negative result within the first hours                          of the onset of symptoms does not rule out                          myocardial infarction with certainty.                          If myocardial infarction is still suspected,                          repeat the test at appropriate intervals.  . Troponin I 11/21/2013 <0.30  <0.30 ng/mL Final   Comment:                                 Due to the release kinetics of cTnI,                          a negative result within the first hours                          of the onset of symptoms does not rule out                          myocardial infarction with certainty.                          If myocardial infarction is still suspected,                          repeat the test at appropriate intervals.  Marland Kitchen MRSA by PCR 11/21/2013 NEGATIVE  NEGATIVE Final   Comment:  The GeneXpert MRSA Assay (FDA                          approved for NASAL specimens                          only), is one component of a                          comprehensive MRSA colonization                          surveillance program. It is not                          intended to diagnose MRSA                          infection nor to guide or                          monitor treatment for                          MRSA infections.  . Pro B Natriuretic peptide (BNP) 11/21/2013 1741.0* 0 - 450 pg/mL Final  . Sodium 11/23/2013 138  137 - 147 mEq/L  Final  . Potassium 11/23/2013 4.1  3.7 - 5.3 mEq/L Final  . Chloride 11/23/2013 100  96 - 112 mEq/L Final  . CO2 11/23/2013 28  19 - 32 mEq/L Final  . Glucose, Bld 11/23/2013 106* 70 - 99 mg/dL Final  . BUN 11/23/2013 17  6 - 23 mg/dL Final  . Creatinine, Ser 11/23/2013 0.35* 0.50 - 1.10 mg/dL Final  . Calcium 11/23/2013 8.3* 8.4 - 10.5 mg/dL Final  . GFR calc non Af Amer 11/23/2013 >90  >90 mL/min Final  . GFR calc Af Amer 11/23/2013 >90  >90 mL/min Final   Comment: (NOTE)                          The eGFR has been calculated using the CKD EPI equation.                          This calculation has not been validated in all clinical situations.                          eGFR's persistently <90 mL/min signify possible Chronic Kidney                          Disease.  . Anion gap 11/23/2013 10  5 - 15 Final      Assessment/Plan 1. Essential hypertension -Patient is stable, blood pressure has improved with increased dose of hydralazine. To cont hydralazine 50 mg BID; Will monitor and change as needed. To hold BP medication for sbp <100.  2. Parkinson's Disease  Continues on Sinemet, patient at risk for orthostatic hypotension.  Educated caregiver that when she walks to monitor patient for orthostatic hypotension to prevent falls.

## 2014-04-20 ENCOUNTER — Ambulatory Visit: Payer: Medicare Other | Admitting: Neurology

## 2014-04-23 ENCOUNTER — Ambulatory Visit (INDEPENDENT_AMBULATORY_CARE_PROVIDER_SITE_OTHER): Payer: Medicare Other | Admitting: Neurology

## 2014-04-23 ENCOUNTER — Encounter: Payer: Self-pay | Admitting: Neurology

## 2014-04-23 VITALS — BP 133/77 | HR 68 | Temp 97.1°F

## 2014-04-23 DIAGNOSIS — R413 Other amnesia: Secondary | ICD-10-CM | POA: Diagnosis not present

## 2014-04-23 DIAGNOSIS — R54 Age-related physical debility: Secondary | ICD-10-CM

## 2014-04-23 DIAGNOSIS — G20 Parkinson's disease: Secondary | ICD-10-CM

## 2014-04-23 DIAGNOSIS — S52502S Unspecified fracture of the lower end of left radius, sequela: Secondary | ICD-10-CM

## 2014-04-23 NOTE — Progress Notes (Signed)
Subjective:    Patient ID: Vanessa Mcbride is a 81 y.o. female.  HPI     Interim history:   Vanessa Mcbride is a very friendly 81 year old right-handed woman with an underlying medical history of hyperlipidemia, hypertension, osteoarthritis, insomnia, memory loss, anxiety and history of fall with fracture of her left arm in 2014, who presents for followup consultation of her right-sided predominant Parkinson's disease. She is accompanied y one of her caretakers from [name], Vanessa Mcbride, and her Daughter Vanessa Mcbride) today. I last saw her on 11/07/2013, at which time her daughter worried about her weight gain. She was having difficulty maintaining sleep. Melatonin and amlodipine had been stopped. Her primary care physician prescribed Belsomra, but her daughter was not comfortable with her taking a sleeping pill and it was not started. She was having more exhaustion and fatigue. She had more trouble walking and was getting slower. She had to get up to use the bathroom 2 or 3 times per night. Her daughter resigned from her job to be closer to her mother and be with her mother more often.  I suggested we increase her Sinemet to 1-1/2 pills 4 times a day and we also added a Sinemet CR at bedtime. Unfortunately, in the interim, the patient fell on 11/20/2013. She was found to have a fractured vertebra. She was also found to be in new onset A. fib with rapid RVR. Unfortunately, this hospital stay left her more debilitated and frail. She needed 2 person assist. In the interim, she was seen by our nurse practitioner, Vanessa Mcbride, on 01/08/2014, at which time I also saw the patient. We kept her medications the same.  Today, her daughter reports that Vanessa Mcbride has been stable with her weight. She lost about 10 pounds but since then has been stable. She had gained some weight since the weight loss was welcome. She has had some increases in her blood pressure values and had an increase in her hydralazine to 50 mg twice daily. She seems  to be in sinus rhythm. She seems to have a good appetite. She is needing a 2 person assist with walking. Thankfully she has not fallen recently. She is less and less verbal. I reviewed her MAR. She does not appear to be on Sinemet CR at night at this time but is taking Sinemet immediate release 25-100 milligrams strength 1-1/2 pills 4 times a day.  I saw her on 04/25/2013, at which time I felt that her symptoms were worse with respect to her walking and her tremors. I suggested a slight increase in her Sinemet to alternating 1-1/2 pills with one pill for a total of 4 doses and a total of 5 pills. Her caretakers reported that she snored and had had some apneic pauses in her breathing. I suggested a sleep study but they suggested that he check with the patient's daughter and call back with regards to doing a sleep study. In the interim, she was seen by our nurse practitioner, Vanessa Mcbride on 07/20/2013 at which time she was fairly stable and the medications were kept the same.   I saw her on 12/23/2012, and which time I increased her Sinemet to 4 times daily from tid, as I felt that she had worsened since her previous visit. She reported worse memory, poor sleep, and occasional muscle jerks. I continued her on 1-1/2 pills for the first dose and one pill each time for the last 3 doses.   I saw her on 09/21/2012, at which time  I increased her morning dose of Sinemet to 1-1/2 pills and otherwise she was to continue her midday dose at one pill and her afternoon dose at one pill. Prior to that I saw her on 06/09/2012. I did not make any medication changes in 3/14. Her primary care physician encouraged her to use her walker or cane but she does not like to use them. She holds onto her caretaker.    Her Past Medical History Is Significant For: Past Medical History  Diagnosis Date  . Parkinson's disease   . Hypertension   . Arthritis     osteoarthritis  . Scoliosis   . Memory loss 06/09/2012  . Aortic valve  disorders 04/11/2012  . Dementia in conditions classified elsewhere without behavioral disturbance 04/07/2012  . Hyperlipidemia   . Osteoarthrosis, unspecified whether generalized or localized, unspecified site   . Insomnia, unspecified 03/20/2012  . Fracture closed, humerus 03/20/2012  . Other closed fractures of distal end of radius (alone) 03/20/2012  . Transient ischemic attack (TIA), and cerebral infarction without residual deficits(V12.54)   . Unspecified constipation   . Debility, unspecified 03/18/2012  . Anxiety   . Generalized anxiety disorder   . Urinary incontinence 07/05/2013    Re: PD  . Atrial fibrillation     Her Past Surgical History Is Significant For: Past Surgical History  Procedure Laterality Date  . Breast surgery      benign lumpectomy  . Cholecystectomy      Her Family History Is Significant For: Family History  Problem Relation Age of Onset  . Heart disease Son     CAD  . Diabetes Mother   . Diabetes Sister   . Hypertension Sister     Her Social History Is Significant For: History   Social History  . Marital Status: Widowed    Spouse Name: N/A    Number of Children: 2  . Years of Education: college   Occupational History  . retired Armed forces operational officer    Social History Main Topics  . Smoking status: Never Smoker   . Smokeless tobacco: Never Used  . Alcohol Use: No  . Drug Use: No  . Sexual Activity: No   Other Topics Concern  . None   Social History Narrative   Widowed 02/2012 (after 66 yr marriage). [place], understands English fairly well, speaks English poorly. Occupation: Retired Armed forces operational officer. Moved form [place] to [place] 03/2012,     Lives at [employer],  [place] section. Has 24 hour caregivers.   Advanced Directives:  DNR, MOST (05/2012)        Her Allergies Are:  Allergies  Allergen Reactions  . Iodine Anaphylaxis  . Iohexol      Desc: pt has had contrast in the past (years ago) and it causea  anaphylaxis- per pt's daughter.  stephanie davis,rtrct, Onset Date: [id number]   :   Her Current Medications Are:  Outpatient Encounter Prescriptions as of 04/23/2014  Medication Sig  . acetaminophen (TYLENOL) 325 MG tablet Take 650 mg by mouth. Take 2 tablets 3 times daily as needed  . acetaminophen (TYLENOL) 500 MG tablet Take 500 mg by mouth. Take twice daily  . aspirin 81 MG tablet Take 81 mg by mouth daily.  . bisacodyl (DULCOLAX) 5 MG EC tablet Take 1 tablet (5 mg total) by mouth 2 (two) times daily.  . carbidopa-levodopa (SINEMET CR) 50-200 MG per tablet Take 1 tablet by mouth at bedtime.  . carbidopa-levodopa (SINEMET IR) 25-100 MG per tablet  Take 1 1/2 tablet 4 times a day, at 6 am, 10 am, 2 pm, and 6 pm  . diclofenac sodium (VOLTAREN) 1 % GEL Apply 2 g topically. Use 2 grams to each shoulder three times daily to reduce pain  . hydrALAZINE (APRESOLINE) 50 MG tablet Take 50 mg by mouth. Take one twice daily hold for systolic <283  . Magnesium Hydroxide (MILK OF MAGNESIA PO) Take 45 mLs by mouth once as needed (constipation).  . polyethylene glycol (MIRALAX / GLYCOLAX) packet Take 17 g by mouth daily as needed.  :  Review of Systems:  Out of a complete 14 point review of systems, all are reviewed and negative with the exception of these symptoms as listed below:   Review of Systems  Neurological:       Blood pressure is getter higher than normal    Objective:  Neurologic Exam  Physical Exam Physical Examination:   Filed Vitals:   04/23/14 1122  BP: 133/77  Mcbride: 68  Temp: 97.1 F (36.2 C)    General Examination: The patient is a very pleasant 81 y.o. female in no acute distress. She appears frail and is minimally verbal. She is in her wheelchair with feet propped up a little bit.    HEENT exam: Normocephalic, atraumatic, moderate facial masking is noted and she keeps her mouth open more. She has no lip, neck or jaw tremor. Neck tone is moderately elevated. She has  normal range of motion. Hearing is intact. Speech is otherwise clear. Oropharynx exam reveals no abnormalities. Pupils are reactive to light and extraocular tracking is fair with moderate saccadic breakdown of smooth pursuit. Chest is clear to auscultation with no wheezing or rhonchi. Heart sounds are normal without murmurs, rubs or gallops. heart sounds are regular. Abdomen is soft, nontender. Bowel sounds are appreciated. She has no pitting edema today, with the exception of mild puffiness around the left ankle . Neurologically: Mental status: The patient is awake, alert and oriented to self, and circumstance. She is able to answer questions with only yes or no primarily but also tries to verbalize other responses but is very difficult to understand. She has evidence of bradyphrenia which is quite significant at this time. Cranial nerves are as described under HEENT exam. Motor exam: She a global strength of 4+/5. Her left arm mobility is better and she has spontaneous movements of the left upper and lower extremities but minimal spontaneous movements on the right. Tone is increased from last time and bradykinesia is worse. There is moderate cogwheeling noted in the right upper extremity and a little milder on the L. She has no tremor today. She has significant slowness, worse than before. She has severe impairment of finger taps on the R and moderate to severe impairment on the L. Same with hand movements. She has severe impairment of foot taps on the R and moderately so on the L.  I did not have her stand or walk for me today for safety. Sensory exam is intact to light touch throughout.   Assessment and plan:    In summary, Ms. Jans is a very pleasant 81 year old lady with an underlying medical history of hyperlipidemia, hypertension, osteoarthritis, insomnia, memory loss, anxiety and history of fall with fracture of her left arm in 2014 and recent Dx of of a fib with RVR, who presents for follow-up  consultation of her advanced, right-sided predominant Parkinson's disease, complicated by constipation, obstipation, advancing age and overall frailty and falls with injuries. She  has had progression of her symptoms. I had a long chat with the patient and particularly her daughter regarding her advanced symptoms and limitations because of the age factor and concern for potential side effects with medications. I would like for her to continue with Sinemet 1-1/2 pills 4 times a day and continue or restart Sinemet CR 50-200 milligrams strength one pill at bedtime. The patient and her caretakers were in agreement. I would like see her back in about 3 months. I answered all their questions today. I filled out her paperwork from her assisted living facility as well as provided them with written instructions. I spent 30 min in total face-to-face time with the patient, more 50% of which was spent in counseling and coordination of care, reviewing test results, reviewing medication and reviewing the diagnosis of advanced PD, its prognosis and treatment options.

## 2014-05-28 ENCOUNTER — Ambulatory Visit: Payer: Medicare Other | Admitting: Nurse Practitioner

## 2014-05-28 ENCOUNTER — Ambulatory Visit: Payer: Medicare Other | Admitting: Neurology

## 2014-05-30 ENCOUNTER — Encounter: Payer: Self-pay | Admitting: Nurse Practitioner

## 2014-05-30 ENCOUNTER — Non-Acute Institutional Stay: Payer: Medicare Other | Admitting: Nurse Practitioner

## 2014-05-30 VITALS — BP 130/72 | HR 68

## 2014-05-30 DIAGNOSIS — L989 Disorder of the skin and subcutaneous tissue, unspecified: Secondary | ICD-10-CM | POA: Diagnosis not present

## 2014-05-30 NOTE — Progress Notes (Signed)
Patient ID: Vanessa Mcbride, female   DOB: May 21, 1924, 81 y.o.   MRN: [id number]    Nursing Home Location:  [facility] (12)  PCP: [name], TIFFANY, DO  Allergies  Allergen Reactions  . Iodine Anaphylaxis  . Iohexol      Desc: pt has had contrast in the past (years ago) and it causea anaphylaxis- per pt's daughter.  stephanie davis,rtrct, Onset Date: [id number]     Chief Complaint  Patient presents with  . Acute Visit    irritated papule o ride side of nose, draining    HPI:  Patient is a 81 y.o. female seen today at [facility] with chief complaint of a small right papule on the right side of the nose.  It began as redness about 3 weeks ago and then gradually got larger.  She has used antibiotic ointment about 2 weeks ago.  Denies injury or scratching her face (can not even get hands to face due to limited ROM.    Review of Systems:  Review of Systems  Constitutional: Negative for fever, chills and fatigue.  HENT: Negative for postnasal drip and sinus pressure.   Respiratory: Negative for cough, shortness of breath and wheezing.   Cardiovascular: Negative for chest pain, palpitations and leg swelling.  Skin: Negative for color change, pallor, rash and wound.       Small raised papule on right bridge of nose.     Past Medical History  Diagnosis Date  . Parkinson's disease   . Hypertension   . Arthritis     osteoarthritis  . Scoliosis   . Memory loss 06/09/2012  . Aortic valve disorders 04/11/2012  . Dementia in conditions classified elsewhere without behavioral disturbance 04/07/2012  . Hyperlipidemia   . Osteoarthrosis, unspecified whether generalized or localized, unspecified site   . Insomnia, unspecified 03/20/2012  . Fracture closed, humerus 03/20/2012  . Other closed fractures of distal end of radius (alone) 03/20/2012  . Transient ischemic attack (TIA), and cerebral infarction without residual  deficits(V12.54)   . Unspecified constipation   . Debility, unspecified 03/18/2012  . Anxiety   . Generalized anxiety disorder   . Urinary incontinence 07/05/2013    Re: PD  . Atrial fibrillation    Past Surgical History  Procedure Laterality Date  . Breast surgery      benign lumpectomy  . Cholecystectomy     Social History:   reports that she has never smoked. She has never used smokeless tobacco. She reports that she does not drink alcohol or use illicit drugs.  Family History  Problem Relation Age of Onset  . Heart disease Son     CAD  . Diabetes Mother   . Diabetes Sister   . Hypertension Sister     Medications: Patient's Medications  New Prescriptions   No medications on file  Previous Medications   ACETAMINOPHEN (TYLENOL) 325 MG TABLET    Take 650 mg by mouth. Take 2 tablets 3 times daily as needed   ACETAMINOPHEN (TYLENOL) 500 MG TABLET    Take 500 mg by mouth. Take twice daily   ASPIRIN 81 MG TABLET    Take 81 mg by mouth daily.   BISACODYL (DULCOLAX) 5 MG EC TABLET    Take 1 tablet (5 mg total) by mouth 2 (two) times daily.   CARBIDOPA-LEVODOPA (SINEMET CR) 50-200 MG PER TABLET    Take 1 tablet by mouth at bedtime.   CARBIDOPA-LEVODOPA (SINEMET IR)  25-100 MG PER TABLET    Take 1 1/2 tablet 4 times a day, at 6 am, 10 am, 2 pm, and 6 pm   DICLOFENAC SODIUM (VOLTAREN) 1 % GEL    Apply 2 g topically. Use 2 grams to each shoulder three times daily to reduce pain   HYDRALAZINE (APRESOLINE) 50 MG TABLET    Take 50 mg by mouth. Take one twice daily hold for systolic <320   MAGNESIUM HYDROXIDE (MILK OF MAGNESIA PO)    Take 45 mLs by mouth once as needed (constipation).   POLYETHYLENE GLYCOL (MIRALAX / GLYCOLAX) PACKET    Take 17 g by mouth daily as needed.  Modified Medications   No medications on file  Discontinued Medications   No medications on file     Physical Exam: Filed Vitals:   05/30/14 1006  BP: 130/72  Pulse: 68  SpO2: 92%    Physical Exam    Constitutional: She appears well-developed. No distress.  HENT:  Head: Normocephalic and atraumatic.  Cardiovascular: Normal rate, regular rhythm and normal heart sounds.   Pulmonary/Chest: Effort normal and breath sounds normal.  Neurological: She is alert.  Skin: Skin is warm and dry.  2 mm red raised lesion Plaque in center on right bridge of nose. Irritation noted around nose No draining noted, however nursing reports that it was draining clear fluid this morning.      Labs reviewed: Basic Metabolic Panel:  Recent Labs  11/20/13 2001 11/21/13 0605 11/23/13 0249  NA 136* 138 138  K 3.9 3.6* 4.1  CL 94* 99 100  CO2 29 27 28   GLUCOSE 138* 147* 106*  BUN 12 12 17   CREATININE 0.46* 0.41* 0.35*  CALCIUM 9.6 8.9 8.3*  MG  --  2.5  --   PHOS  --  2.7  --    Liver Function Tests:  Recent Labs  11/21/13 0605  AST 9  ALT <5  ALKPHOS 55  BILITOT 1.0  PROT 6.7  ALBUMIN 3.4*   No results for input(s): LIPASE, AMYLASE in the last 8760 hours. No results for input(s): AMMONIA in the last 8760 hours. CBC:  Recent Labs  11/20/13 2001 11/21/13 0605  WBC 10.6* 10.4  HGB 14.9 14.4  HCT 44.4 43.5  MCV 94.9 96.5  PLT 210 212   TSH:  Recent Labs  11/21/13 0605  TSH 1.470   A1C: No results found for: HGBA1C Lipid Panel: No results for input(s): CHOL, HDL, LDLCALC, TRIG, CHOLHDL, LDLDIRECT in the last 8760 hours.  Assessment/Plan 1. Skin Lesion Possible skin cancer due to nonhealing lesion, will add mupirocin ointment twice daily for 5 days, to keep appt with dermatologist on 06/15/14 for further evaluation.   To keep follow up with Dr Mariea Clonts Next month

## 2014-06-13 DIAGNOSIS — R2681 Unsteadiness on feet: Secondary | ICD-10-CM | POA: Diagnosis not present

## 2014-06-13 DIAGNOSIS — R278 Other lack of coordination: Secondary | ICD-10-CM | POA: Diagnosis not present

## 2014-06-13 DIAGNOSIS — F0280 Dementia in other diseases classified elsewhere without behavioral disturbance: Secondary | ICD-10-CM | POA: Diagnosis not present

## 2014-06-13 DIAGNOSIS — R2689 Other abnormalities of gait and mobility: Secondary | ICD-10-CM | POA: Diagnosis not present

## 2014-06-13 DIAGNOSIS — G20 Parkinson's disease: Secondary | ICD-10-CM | POA: Diagnosis not present

## 2014-06-14 DIAGNOSIS — R2681 Unsteadiness on feet: Secondary | ICD-10-CM | POA: Diagnosis not present

## 2014-06-14 DIAGNOSIS — R2689 Other abnormalities of gait and mobility: Secondary | ICD-10-CM | POA: Diagnosis not present

## 2014-06-14 DIAGNOSIS — F0280 Dementia in other diseases classified elsewhere without behavioral disturbance: Secondary | ICD-10-CM | POA: Diagnosis not present

## 2014-06-14 DIAGNOSIS — R278 Other lack of coordination: Secondary | ICD-10-CM | POA: Diagnosis not present

## 2014-06-14 DIAGNOSIS — G20 Parkinson's disease: Secondary | ICD-10-CM | POA: Diagnosis not present

## 2014-06-15 DIAGNOSIS — R2689 Other abnormalities of gait and mobility: Secondary | ICD-10-CM | POA: Diagnosis not present

## 2014-06-15 DIAGNOSIS — F0280 Dementia in other diseases classified elsewhere without behavioral disturbance: Secondary | ICD-10-CM | POA: Diagnosis not present

## 2014-06-15 DIAGNOSIS — L814 Other melanin hyperpigmentation: Secondary | ICD-10-CM | POA: Diagnosis not present

## 2014-06-15 DIAGNOSIS — L570 Actinic keratosis: Secondary | ICD-10-CM | POA: Diagnosis not present

## 2014-06-15 DIAGNOSIS — L820 Inflamed seborrheic keratosis: Secondary | ICD-10-CM | POA: Diagnosis not present

## 2014-06-15 DIAGNOSIS — G20 Parkinson's disease: Secondary | ICD-10-CM | POA: Diagnosis not present

## 2014-06-15 DIAGNOSIS — R278 Other lack of coordination: Secondary | ICD-10-CM | POA: Diagnosis not present

## 2014-06-15 DIAGNOSIS — R2681 Unsteadiness on feet: Secondary | ICD-10-CM | POA: Diagnosis not present

## 2014-06-15 DIAGNOSIS — L578 Other skin changes due to chronic exposure to nonionizing radiation: Secondary | ICD-10-CM | POA: Diagnosis not present

## 2014-06-20 DIAGNOSIS — R2681 Unsteadiness on feet: Secondary | ICD-10-CM | POA: Diagnosis not present

## 2014-06-20 DIAGNOSIS — R278 Other lack of coordination: Secondary | ICD-10-CM | POA: Diagnosis not present

## 2014-06-20 DIAGNOSIS — G20 Parkinson's disease: Secondary | ICD-10-CM | POA: Diagnosis not present

## 2014-06-20 DIAGNOSIS — R2689 Other abnormalities of gait and mobility: Secondary | ICD-10-CM | POA: Diagnosis not present

## 2014-06-20 DIAGNOSIS — F0280 Dementia in other diseases classified elsewhere without behavioral disturbance: Secondary | ICD-10-CM | POA: Diagnosis not present

## 2014-06-26 DIAGNOSIS — R2689 Other abnormalities of gait and mobility: Secondary | ICD-10-CM | POA: Diagnosis not present

## 2014-06-26 DIAGNOSIS — F0280 Dementia in other diseases classified elsewhere without behavioral disturbance: Secondary | ICD-10-CM | POA: Diagnosis not present

## 2014-06-26 DIAGNOSIS — G20 Parkinson's disease: Secondary | ICD-10-CM | POA: Diagnosis not present

## 2014-06-26 DIAGNOSIS — R278 Other lack of coordination: Secondary | ICD-10-CM | POA: Diagnosis not present

## 2014-06-26 DIAGNOSIS — R2681 Unsteadiness on feet: Secondary | ICD-10-CM | POA: Diagnosis not present

## 2014-07-05 ENCOUNTER — Telehealth: Payer: Self-pay | Admitting: Neurology

## 2014-07-05 DIAGNOSIS — G20 Parkinson's disease: Secondary | ICD-10-CM

## 2014-07-05 NOTE — Telephone Encounter (Signed)
Patient's daughter Dr. Carlisle Cater @ [phone], has questions regarding Rx carbidopa-levodopa (SINEMET IR) 25-100 MG per tablet.  Patient having difficulty swallowing medication.  Please call and advise.

## 2014-07-05 NOTE — Telephone Encounter (Signed)
Pls see if they can crush and mix with applesauce. Should be possible. Pls call Dr. Carlisle Cater, her daughter

## 2014-07-06 ENCOUNTER — Telehealth: Payer: Self-pay | Admitting: Neurology

## 2014-07-06 ENCOUNTER — Telehealth: Payer: Self-pay

## 2014-07-06 DIAGNOSIS — G20 Parkinson's disease: Secondary | ICD-10-CM

## 2014-07-06 MED ORDER — CARBIDOPA-LEVODOPA 25-100 MG PO TABS: ORAL_TABLET | ORAL | Status: DC

## 2014-07-06 NOTE — Telephone Encounter (Signed)
Dr. Carlisle Cater (daughter) called back. States that Vanessa Mcbride is able to take daily Sinemet IR during the day with applesauce but cannot swallow the 8pm CR tablet. She asks if the patient can take 2 of the Sinemet 25-100 IR at 8pm (crushed), or try a Neupro patch during the evenings.  New order will need to be faxed to [facility] at [phone]. Also daughter will like to know what Dr. Rexene Alberts recommends, can leave it on voice mail [phone].

## 2014-07-06 NOTE — Telephone Encounter (Signed)
Sherese called to verify order that we faxed to facility today (see last phone note). We corrected order (changing order from taking Carb-Levo 4x a day to 5x a day. We re-faxed orders for Carb-Lavo 25-100 IR, Take 1.5 tab 5x a day, at 6am, 10am, 2pm, 6pm, and at bedtime. Also to stop Sinemet CR.

## 2014-07-06 NOTE — Telephone Encounter (Signed)
Left message to call back  

## 2014-07-06 NOTE — Telephone Encounter (Signed)
I faxed the new order to [facility]. I called and spoke to [facility] who is head of Geraldine's department and advised her on medication changes. I also spoke with daughter and she is aware of changes.

## 2014-07-06 NOTE — Telephone Encounter (Signed)
Rx corrected and reprinted for Sinemet.

## 2014-07-06 NOTE — Telephone Encounter (Signed)
I am afraid doubling up on the regular Sinemet at 8 PM would be too much. I would recommend we just try use regular Sinemet 1 1/2 pills 5 times a day and stop the CR at 8 PM. Neupro patch has risk for too much side effects and I would not recommend we try this at this time. I will place new Prescription and we will fax to [facility].  Pls advise Vanessa Mcbride.

## 2014-07-10 ENCOUNTER — Encounter: Payer: Self-pay | Admitting: Internal Medicine

## 2014-07-10 ENCOUNTER — Non-Acute Institutional Stay: Payer: Medicare Other | Admitting: Internal Medicine

## 2014-07-10 VITALS — BP 124/68 | HR 72 | Temp 98.6°F

## 2014-07-10 DIAGNOSIS — I10 Essential (primary) hypertension: Secondary | ICD-10-CM | POA: Diagnosis not present

## 2014-07-10 DIAGNOSIS — R32 Unspecified urinary incontinence: Secondary | ICD-10-CM

## 2014-07-10 DIAGNOSIS — F0280 Dementia in other diseases classified elsewhere without behavioral disturbance: Secondary | ICD-10-CM

## 2014-07-10 DIAGNOSIS — G20A1 Parkinson's disease without dyskinesia, without mention of fluctuations: Secondary | ICD-10-CM

## 2014-07-10 DIAGNOSIS — G20 Parkinson's disease: Secondary | ICD-10-CM | POA: Diagnosis not present

## 2014-07-10 DIAGNOSIS — Z23 Encounter for immunization: Secondary | ICD-10-CM

## 2014-07-10 DIAGNOSIS — R1310 Dysphagia, unspecified: Secondary | ICD-10-CM | POA: Diagnosis not present

## 2014-07-10 DIAGNOSIS — K5900 Constipation, unspecified: Secondary | ICD-10-CM | POA: Diagnosis not present

## 2014-07-10 DIAGNOSIS — K5909 Other constipation: Secondary | ICD-10-CM

## 2014-07-10 NOTE — Progress Notes (Signed)
Patient ID: Vanessa Mcbride, female   DOB: 1924/04/25, 81 y.o.   MRN: [id number]   Location:  [facility]  Code Status: DNR Goals of Care: Advanced Directive information Does patient have an advance directive?: Yes, Type of Advance Directive: Out of facility DNR (pink MOST or yellow form), Pre-existing out of facility DNR order (yellow form or pink MOST form): Pink MOST form placed in chart (order not valid for inpatient use);Yellow form placed in chart (order not valid for inpatient use), Does patient want to make changes to advanced directive?: No - Patient declined   Allergies  Allergen Reactions  . Iodine Anaphylaxis  . Iohexol      Desc: pt has had contrast in the past (years ago) and it causea anaphylaxis- per pt's daughter.  stephanie davis,rtrct, Onset Date: [id number]     Chief Complaint  Patient presents with  . Medical Management of Chronic Issues    Parkinson, dementia , blood pressure. AL nurse note blood pressure running high at night per 2nd shift nurse.    HPI: Patient is a 81 y.o. white female seen in the office today for f/u on chronic diseases.  She is accompanied by her aide.     BP had been running high at change of shift.  She gets very fatigued around 7pm.  She is in bed when the night bps are done.  Discussed supine htn in PD.  Did have bm yesterday morning.    Holds liquids in her mouth at times as her PD is progressing.    Review of Systems: per aide, resident occasionally answers yes/no questions, but not all of the time and responses are delayed considerably Review of Systems  Constitutional: Positive for malaise/fatigue. Negative for fever and chills.  HENT: Negative for congestion.   Respiratory: Negative for shortness of breath.   Cardiovascular: Negative for chest pain and leg swelling.  Gastrointestinal: Negative for abdominal pain, constipation, blood in stool and melena.  Genitourinary: Negative for dysuria.  Musculoskeletal: Negative for  falls.  Neurological: Positive for tremors and weakness. Negative for dizziness and loss of consciousness.  Psychiatric/Behavioral: Positive for memory loss.     Past Medical History  Diagnosis Date  . Parkinson's disease   . Hypertension   . Arthritis     osteoarthritis  . Scoliosis   . Memory loss 06/09/2012  . Aortic valve disorders 04/11/2012  . Dementia in conditions classified elsewhere without behavioral disturbance 04/07/2012  . Hyperlipidemia   . Osteoarthrosis, unspecified whether generalized or localized, unspecified site   . Insomnia, unspecified 03/20/2012  . Fracture closed, humerus 03/20/2012  . Other closed fractures of distal end of radius (alone) 03/20/2012  . Transient ischemic attack (TIA), and cerebral infarction without residual deficits(V12.54)   . Unspecified constipation   . Debility, unspecified 03/18/2012  . Anxiety   . Generalized anxiety disorder   . Urinary incontinence 07/05/2013    Re: PD  . Atrial fibrillation     Past Surgical History  Procedure Laterality Date  . Breast surgery      benign lumpectomy  . Cholecystectomy      Social History:   reports that she has never smoked. She has never used smokeless tobacco. She reports that she does not drink alcohol or use illicit drugs.  Family History  Problem Relation Age of Onset  . Heart disease Son     CAD  . Diabetes Mother   . Diabetes Sister   . Hypertension Sister  Medications: Patient's Medications  New Prescriptions   No medications on file  Previous Medications   ACETAMINOPHEN (TYLENOL) 325 MG TABLET    Take 650 mg by mouth. Take 2 tablets 3 times daily as needed   ACETAMINOPHEN (TYLENOL) 500 MG TABLET    Take 500 mg by mouth. Take twice daily   ASPIRIN 81 MG TABLET    Take 81 mg by mouth daily.   BISACODYL (DULCOLAX) 10 MG SUPPOSITORY    Place 10 mg rectally. One suppository as needed if no BM in two days   BISACODYL (DULCOLAX) 5 MG EC TABLET    Take 1 tablet (5 mg total)  by mouth 2 (two) times daily.   CARBIDOPA-LEVODOPA (SINEMET IR) 25-100 MG PER TABLET    Take 1 1/2 tablet 5 times a day, at 6 am, 10 am, 2 pm, 6 pm and bedtime.  Stop Sinemet CR.   CLONIDINE (CATAPRES) 0.1 MG TABLET    Take 0.1 mg by mouth. Take one only if SBP 170 or greater; every 6 hours as needed   DICLOFENAC SODIUM (VOLTAREN) 1 % GEL    Apply 2 g topically. Use 2 grams to each shoulder three times daily to reduce pain   HYDRALAZINE (APRESOLINE) 50 MG TABLET    Take 50 mg by mouth. Take one twice daily hold for systolic <562   MAGNESIUM HYDROXIDE (MILK OF MAGNESIA PO)    Take 45 mLs by mouth once as needed (constipation).   POLYETHYLENE GLYCOL (MIRALAX / GLYCOLAX) PACKET    Take 17 g by mouth daily as needed.  Modified Medications   No medications on file  Discontinued Medications   No medications on file     Physical Exam: Filed Vitals:   07/10/14 1602  BP: 124/68  Pulse: 72  Temp: 98.6 F (37 C)  TempSrc: Oral  SpO2: 98%  Physical Exam  Constitutional:  White female seated in wheelchair  Cardiovascular: Normal rate and regular rhythm.   Murmur heard. Pulmonary/Chest: Effort normal and breath sounds normal.  Abdominal: Soft. Bowel sounds are normal.  Neurological: She is alert. A cranial nerve deficit is present. She exhibits abnormal muscle tone.  Resting tremor present, masked facies, hypophonia, dysarthia, dysphagia  Skin: Skin is warm and dry.  Psychiatric:  Appears she is smiling all of the time      Labs reviewed: Basic Metabolic Panel:  Recent Labs  11/20/13 2001 11/21/13 0605 11/23/13 0249  NA 136* 138 138  K 3.9 3.6* 4.1  CL 94* 99 100  CO2 29 27 28   GLUCOSE 138* 147* 106*  BUN 12 12 17   CREATININE 0.46* 0.41* 0.35*  CALCIUM 9.6 8.9 8.3*  MG  --  2.5  --   PHOS  --  2.7  --   TSH  --  1.470  --    Liver Function Tests:  Recent Labs  11/21/13 0605  AST 9  ALT <5  ALKPHOS 55  BILITOT 1.0  PROT 6.7  ALBUMIN 3.4*   No results for  input(s): LIPASE, AMYLASE in the last 8760 hours. No results for input(s): AMMONIA in the last 8760 hours. CBC:  Recent Labs  11/20/13 2001 11/21/13 0605  WBC 10.6* 10.4  HGB 14.9 14.4  HCT 44.4 43.5  MCV 94.9 96.5  PLT 210 212    Assessment/Plan 1. Essential hypertension -bps elevated on night shift--I suspect this is related to supine hypertension from autonomic dysfunction in Parkinson's -she has as needed clonidine that could be used  if her SBP is too high  2. Parkinson disease -continues on sinemet  3. Dementia due to medical condition without behavioral disturbance -due to Parkinson's--seems her dementia came well after her PD--PD is advanced, late stage, and she may even qualify for hospice care at this time  4. Dysphagia -due to advanced PD -with liquids noted as above, cont modified diet and assistance with meals  5. Chronic constipation -cont miralax, prn bisacodyl -also related to PD  6. Urinary incontinence, unspecified incontinence type -due to advanced PD, medication unlikely to be helpful in this case -continue use of depends  7. Need for vaccination with 13-polyvalent pneumococcal conjugate vaccine -prevnar given - Pneumococcal conjugate vaccine 13-valent  Labs/tests ordered: none, but will need before her EV Next appt: July OV  Cleofas Hudgins L. Arna Luis, D.O. [facility] [address], [place] [address] Cell Phone (Mon-Fri 8am-5pm):  [phone] On Call:  [phone] & follow prompts after 5pm & weekends Office Phone:  [phone] Office Fax:  [phone]

## 2014-07-13 DIAGNOSIS — L814 Other melanin hyperpigmentation: Secondary | ICD-10-CM | POA: Diagnosis not present

## 2014-07-13 DIAGNOSIS — L570 Actinic keratosis: Secondary | ICD-10-CM | POA: Diagnosis not present

## 2014-07-13 DIAGNOSIS — L578 Other skin changes due to chronic exposure to nonionizing radiation: Secondary | ICD-10-CM | POA: Diagnosis not present

## 2014-07-20 ENCOUNTER — Ambulatory Visit (INDEPENDENT_AMBULATORY_CARE_PROVIDER_SITE_OTHER): Payer: Medicare Other | Admitting: Neurology

## 2014-07-20 ENCOUNTER — Encounter: Payer: Self-pay | Admitting: Neurology

## 2014-07-20 VITALS — BP 138/79 | HR 77 | Resp 14

## 2014-07-20 DIAGNOSIS — R5381 Other malaise: Secondary | ICD-10-CM

## 2014-07-20 DIAGNOSIS — R54 Age-related physical debility: Secondary | ICD-10-CM | POA: Diagnosis not present

## 2014-07-20 DIAGNOSIS — R413 Other amnesia: Secondary | ICD-10-CM | POA: Diagnosis not present

## 2014-07-20 DIAGNOSIS — G20 Parkinson's disease: Secondary | ICD-10-CM | POA: Diagnosis not present

## 2014-07-20 NOTE — Progress Notes (Signed)
Subjective:    Patient ID: Vanessa Mcbride is a 81 y.o. female.  HPI     Interim history:   Vanessa Mcbride is a very friendly 81 year old right-handed woman with an underlying medical history of hyperlipidemia, hypertension, osteoarthritis, insomnia, memory loss, anxiety and history of fall with fracture of her left arm in 2014, who presents for followup consultation of her right-sided predominant Parkinson's disease. She is accompanied y one of her caretakers from Vanessa Mcbride, Vanessa Mcbride, and her Daughter Vanessa Mcbride) today. I last saw her on 04/23/2014, at which time her daughter reported that her weight had stabilized after she had lost about 10 pounds. She had had some increased blood pressure values in her hydralazine was increased to twice daily. She was needing a 2 person assist with walking. She had not fallen. She was becoming less and less verbal however. She was no longer on Sinemet CR at night and was on sinemet immediate release 25-100 milligrams strength 1-1/2 pills 4 times a day. I suggested we restart Sinemet CR at bedtime.  In the interim, her daughter called on 07/05/2014 reporting that the patient was having difficulty swallowing her medications. I suggested we start crushing her Sinemet and giving it with applesauce but this could not be done with a CR. Therefore, I suggested we increase her immediate release Sinemet to 1-1/2 pills 5 times a day and do away with a CR at night.   Today, 07/20/2014: Her daughter reports that things are relatively stable. The patient is tolerating her 5 doses of Sinemet 1-1/2 pills each time. She is overall a little bit more alert. She still has significant trouble with mobility. She needs at least 2 person assist when standing and she cannot walk any longer on her own. She holds onto the walker while one of her caretaker is behind her with a wheelchair just in case and another caretaker next to her. Thankfully she has not fallen recently. Her daughter is noticing an  overall collateral decline in her condition. Unfortunately, her daughter had to deal with her own health issues recently.   Previously:   I saw her on 11/07/2013, at which time her daughter worried about her weight gain. She was having difficulty maintaining sleep. Melatonin and amlodipine had been stopped. Her primary care physician prescribed Belsomra, but her daughter was not comfortable with her taking a sleeping pill and it was not started. She was having more exhaustion and fatigue. She had more trouble walking and was getting slower. She had to get up to use the bathroom 2 or 3 times per night. Her daughter resigned from her job to be closer to her mother and be with her mother more often.   I suggested we increase her Sinemet to 1-1/2 pills 4 times a day and we also added a Sinemet CR at bedtime. Unfortunately, in the interim, the patient fell on 11/20/2013. She was found to have a fractured vertebra. She was also found to be in new onset A. fib with rapid RVR. Unfortunately, this hospital stay left her more debilitated and frail. She needed 2 person assist.  In the interim, she was seen by our nurse practitioner, Vanessa Mcbride, on 01/08/2014, at which time I also saw the patient. We kept her medications the same.   I saw her on 04/25/2013, at which time I felt that her symptoms were worse with respect to her walking and her tremors. I suggested a slight increase in her Sinemet to alternating 1-1/2 pills with one pill for  a total of 4 doses and a total of 5 pills. Her caretakers reported that she snored and had had some apneic pauses in her breathing. I suggested a sleep study but they suggested that he check with the patient's daughter and call back with regards to doing a sleep study. In the interim, she was seen by our nurse practitioner, Vanessa Mcbride on 07/20/2013 at which time she was fairly stable and the medications were kept the same.   I saw her on 12/23/2012, and which time I increased her  Sinemet to 4 times daily from tid, as I felt that she had worsened since her previous visit. She reported worse memory, poor sleep, and occasional muscle jerks. I continued her on 1-1/2 pills for the first dose and one pill each time for the last 3 doses.   I saw her on 09/21/2012, at which time I increased her morning dose of Sinemet to 1-1/2 pills and otherwise she was to continue her midday dose at one pill and her afternoon dose at one pill. Prior to that I saw her on 06/09/2012. I did not make any medication changes in 3/14. Her primary care physician encouraged her to use her walker or cane but she does not like to use them. She holds onto her caretaker.     Her Past Medical History Is Significant For: Past Medical History  Diagnosis Date  . Parkinson's disease   . Hypertension   . Arthritis     osteoarthritis  . Scoliosis   . Memory loss 06/09/2012  . Aortic valve disorders 04/11/2012  . Dementia in conditions classified elsewhere without behavioral disturbance 04/07/2012  . Hyperlipidemia   . Osteoarthrosis, unspecified whether generalized or localized, unspecified site   . Insomnia, unspecified 03/20/2012  . Fracture closed, humerus 03/20/2012  . Other closed fractures of distal end of radius (alone) 03/20/2012  . Transient ischemic attack (TIA), and cerebral infarction without residual deficits(V12.54)   . Unspecified constipation   . Debility, unspecified 03/18/2012  . Anxiety   . Generalized anxiety disorder   . Urinary incontinence 07/05/2013    Re: PD  . Atrial fibrillation     Her Past Surgical History Is Significant For: Past Surgical History  Procedure Laterality Date  . Breast surgery      benign lumpectomy  . Cholecystectomy      Her Family History Is Significant For: Family History  Problem Relation Age of Onset  . Heart disease Son     CAD  . Diabetes Mother   . Diabetes Sister   . Hypertension Sister     Her Social History Is Significant For: History    Social History  . Marital Status: Widowed    Spouse Name: N/A  . Number of Children: 2  . Years of Education: college   Occupational History  . retired Armed forces operational officer    Social History Main Topics  . Smoking status: Never Smoker   . Smokeless tobacco: Never Used  . Alcohol Use: No  . Drug Use: No  . Sexual Activity: No   Other Topics Concern  . None   Social History Narrative   Widowed 02/2012 (after 11 yr marriage). [place], understands English fairly well, speaks English poorly. Occupation: Retired Armed forces operational officer. Moved form [place] to [place] 03/2012,     Lives at [employer],  [place] section. Has 24 hour caregivers.   Advanced Directives:  DNR, MOST (05/2012)        Her Allergies Are:  Allergies  Allergen Reactions  . Iodine Anaphylaxis  . Iohexol      Desc: pt has had contrast in the past (years ago) and it causea anaphylaxis- per pt's daughter.  stephanie davis,rtrct, Onset Date: [id number]   :   Her Current Medications Are:  Outpatient Encounter Prescriptions as of 07/20/2014  Medication Sig  . acetaminophen (TYLENOL) 500 MG tablet Take 500 mg by mouth. Take twice daily  . aspirin 81 MG tablet Take 81 mg by mouth daily.  . bisacodyl (DULCOLAX) 10 MG suppository Place 10 mg rectally. One suppository as needed if no BM in two days  . carbidopa-levodopa (SINEMET IR) 25-100 MG per tablet Take 1 1/2 tablet 5 times a day, at 6 am, 10 am, 2 pm, 6 pm and bedtime.  Stop Sinemet CR.  . cloNIDine (CATAPRES) 0.1 MG tablet Take 0.1 mg by mouth. Take one only if SBP 170 or greater; every 6 hours as needed  . diclofenac sodium (VOLTAREN) 1 % GEL Apply 2 g topically. Use 2 grams to each shoulder three times daily to reduce pain  . hydrALAZINE (APRESOLINE) 50 MG tablet Take 50 mg by mouth. Take one twice daily hold for systolic <510  . polyethylene glycol (MIRALAX / GLYCOLAX) packet Take 17 g by mouth daily as needed.  . [DISCONTINUED] acetaminophen  (TYLENOL) 325 MG tablet Take 650 mg by mouth. Take 2 tablets 3 times daily as needed  . [DISCONTINUED] bisacodyl (DULCOLAX) 5 MG EC tablet Take 1 tablet (5 mg total) by mouth 2 (two) times daily.  . [DISCONTINUED] Magnesium Hydroxide (MILK OF MAGNESIA PO) Take 45 mLs by mouth once as needed (constipation).  :  Review of Systems:  Out of a complete 14 point review of systems, all are reviewed and negative with the exception of these symptoms as listed below:        Review of Systems  Neurological:       Family feels that her Parkinson's symptoms are progressing     Objective:  Neurologic Exam  Physical Exam Physical Examination:   Filed Vitals:   07/20/14 1117  BP: 138/79  Mcbride: 77  Resp: 14    General Examination: The patient is a very pleasant 81 y.o. female in no acute distress. She appears frail and is minimally verbal, but overall more alert and somewhat more attentive than last time. She is in her wheelchair.    HEENT exam: Normocephalic, atraumatic, moderate facial masking is noted and she keeps her mouth open more. She has no lip, neck or jaw tremor. Neck tone is moderately to severely elevated. She has decrease passive ROM in her neck. Hearing seems intact. Speech is minimal, but severely hypophonic and mildly dysarthric. Oropharynx exam reveals: mild mouth dryness, no drooling. Pupils are reactive to light and extraocular tracking is fair with moderate saccadic breakdown of smooth pursuit. Chest is clear to auscultation with no wheezing or rhonchi or crackles.  Heart sounds are normal without murmurs, rubs or gallops. heart sounds are regular. Abdomen is soft, nontender. Bowel sounds are appreciated. Extremities: She has b/l, L>R pitting edema in the distal LEs, worse.  Neurologically: Mental status: The patient is awake, alert and oriented to self, and circumstance. She is able to answer questions with only yes or no primarily but also tries to verbalize some other  responses but is very difficult to understand. She has evidence of significant bradyphrenia with quite a long delay in her responses. Cranial nerves are as described under HEENT exam.  Motor exam: She a global strength of 4/5. Her left arm mobility is better and she has spontaneous movements of the left upper and lower extremities but minimal spontaneous movements on the right. Tone is increased from last time and bradykinesia is severe at this time. There is moderate cogwheeling noted in both UEs. She has no tremor today. She has severe impairment of fine motor skills. She cannot stand or walk without maximum assist and there was no walker available today and she has to grab onto the walker. Sensory exam appears to be intact to light touch throughout and reflexes are preserved. She cannot do heel to shin or finger to nose.   Assessment and plan:    In summary, Ms. Cuervo is a very pleasant 81 year old lady with an underlying medical history of hyperlipidemia, hypertension, osteoarthritis, insomnia, memory loss, anxiety and history of fall with fracture of her left arm in 2014 and more recent Dx of of a fib with RVR, who presents for follow-up consultation of her advanced, right-sided predominant Parkinson's disease, complicated by constipation, obstipation, advancing age, swallowing difficulty, scant speech, swelling of legs, memory loss, and overall frailty and falls with injuries, as well as overall deconditioning. She has had progression of her symptoms over the last few visits. I again had a long chat with the patient and particularly her daughter and her caregiver, Vanessa Mcbride, regarding her advanced symptoms and limitations because of the age factor and concern for potential side effects with medications. I suggested we continue with Sinemet 1-1/2 pills 5 times a day at this time, as this was better than 4 times a day and she could not swallow the Sinemet CR 50-200 mg, and this cannot be crushed. They will continue  to walk her daily, at least briefly, once a day, with 2 person assist and her 2 wheeled walker to grab onto. Her caretakers were in agreement. I would like see her back in about 3 months. I answered all their questions today. I filled out her paperwork from her assisted living facility as well as provided them with written instructions. I spent 20 min in total face-to-face time with the patient, more 50% of which was spent in counseling and coordination of care, reviewing test results, reviewing medication and reviewing the diagnosis of advanced PD, its prognosis and treatment options.

## 2014-07-20 NOTE — Patient Instructions (Signed)
We will continue with Sinemet 25/100 mg 1 1/2 pills 5 times a day, at 6, 10, 2 PM, 6 PM, and bedtime.   We will try to have you walk with a walker with 2 person assist, never alone!

## 2014-07-31 IMAGING — CT CT HEAD W/O CM
3 series · 18 of 30 positions shown, 19 images · non-contrast
Comparison: 03/14/2012

CLINICAL DATA: Fall, head injury

EXAM:
CT HEAD WITHOUT CONTRAST
CT CERVICAL SPINE WITHOUT CONTRAST
TECHNIQUE: Multidetector CT imaging of the head and cervical spine was
performed following the standard protocol without intravenous
contrast. Multiplanar CT image reconstructions of the cervical spine
were also generated.

[Series 3: head w/o · axial · non-contrast · 0.43mm/px · z∈[-109,-39]mm · 3 of 29 slices shown, 4 images]
[im 8/29  brain]
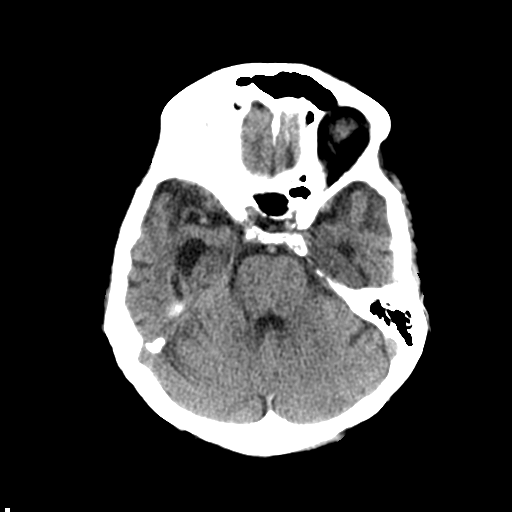
[im 8/29  bone]
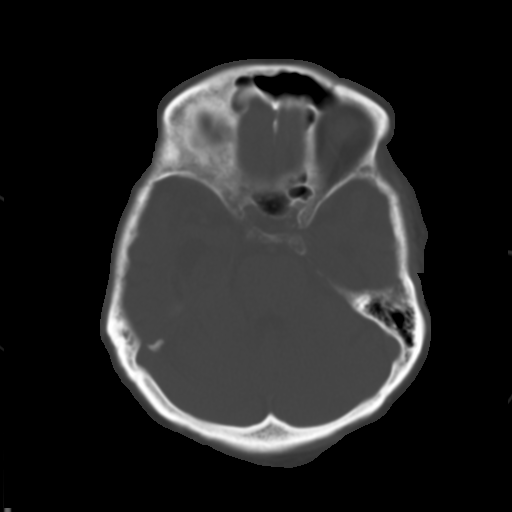
[im 15/29  brain]
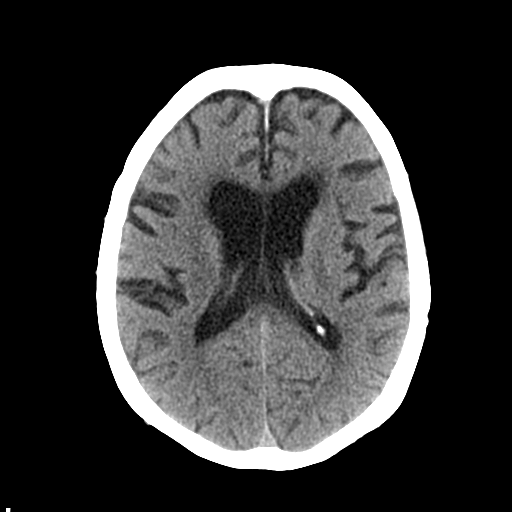
[im 22/29  brain]
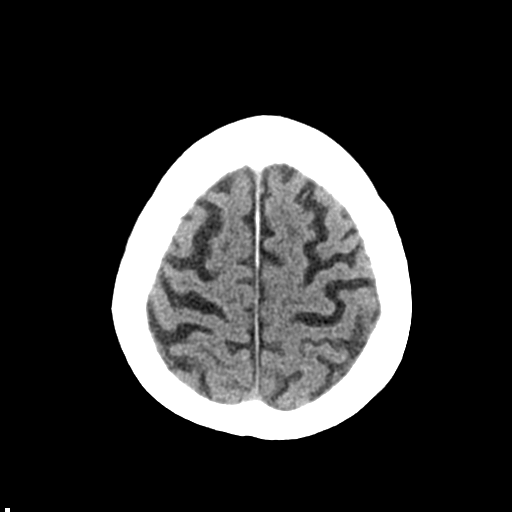

[Series 4: bone windows · axial · 0.43mm/px · z∈[-129,-24]mm · 7 of 47 slices shown]
[im 6/47  bone]
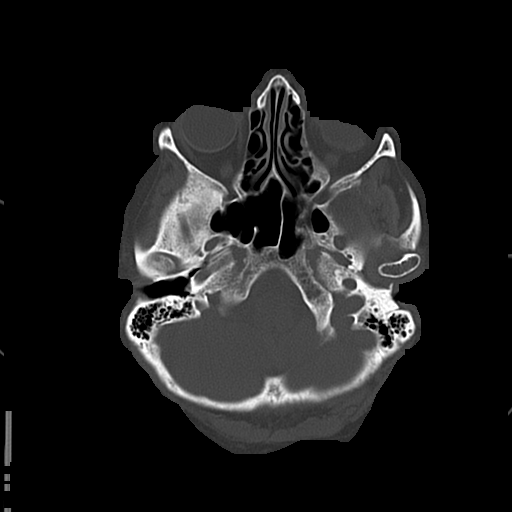
[im 12/47  bone]
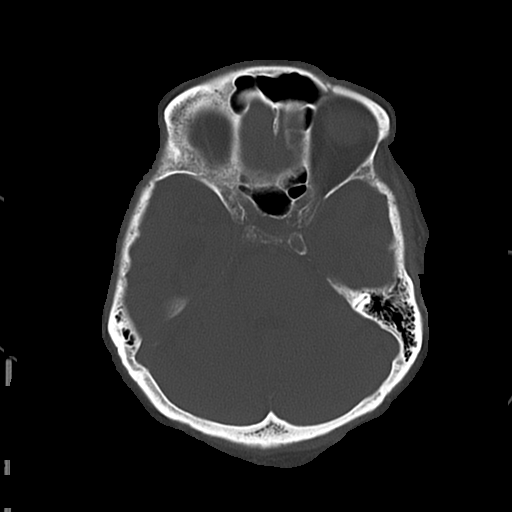
[im 18/47  bone]
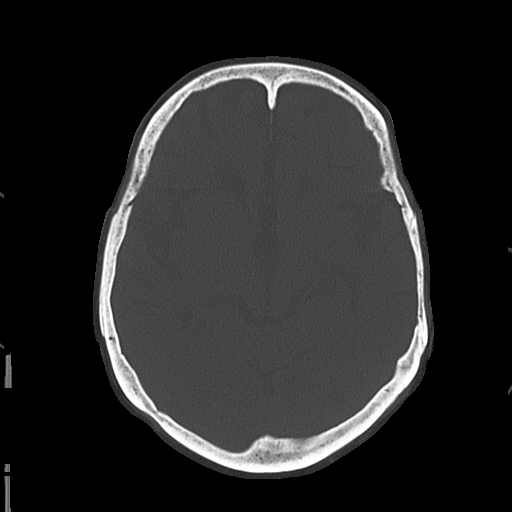
[im 24/47  bone]
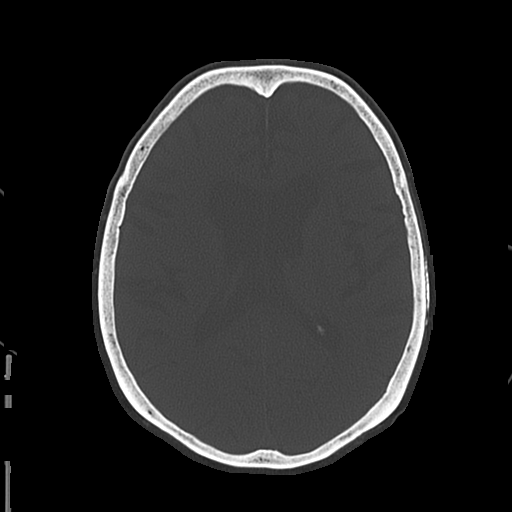
[im 29/47  bone]
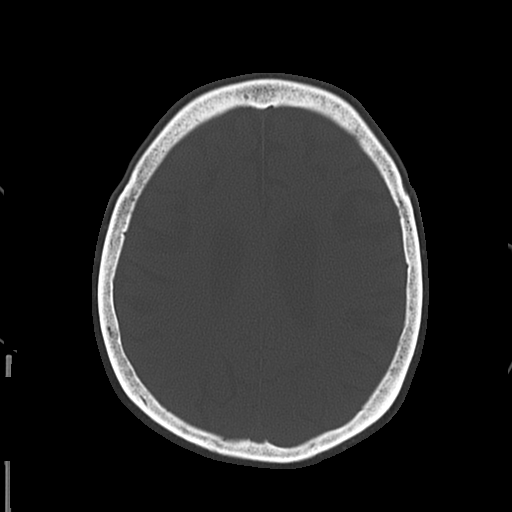
[im 35/47  bone]
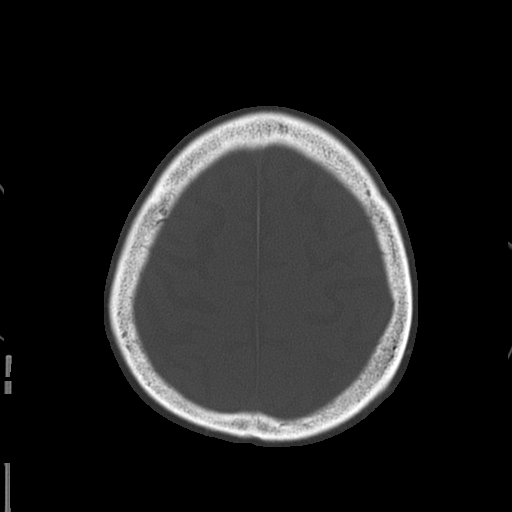
[im 41/47  bone]
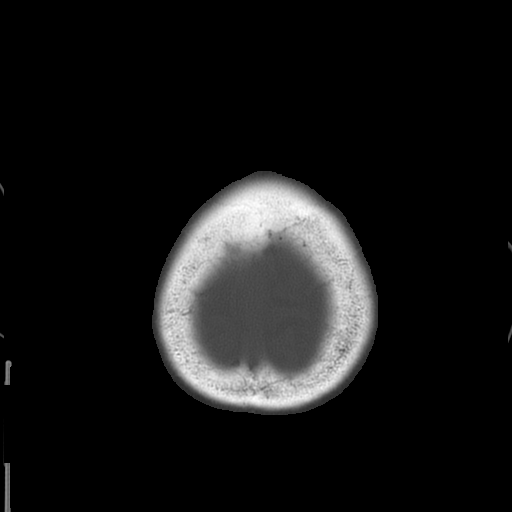

[Series 5: c-spine st · axial · 0.29mm/px · z∈[-287,-143]mm · 8 of 84 slices shown]
[im 6/84  brain]
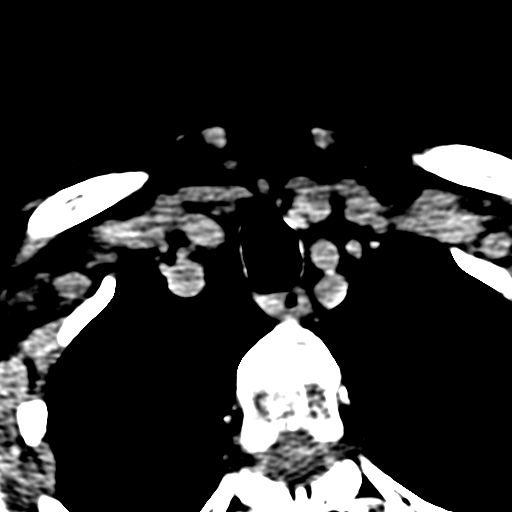
[im 17/84  brain]
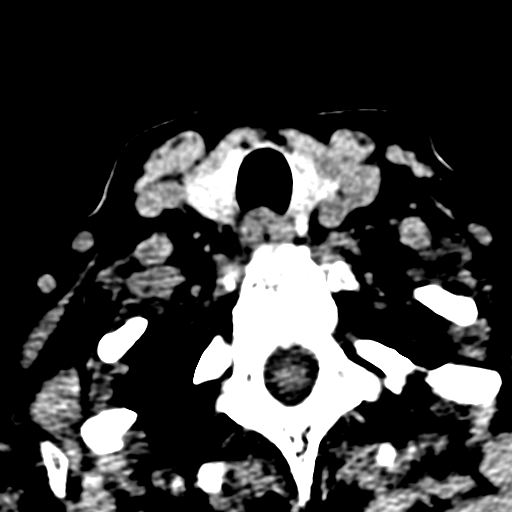
[im 28/84  brain]
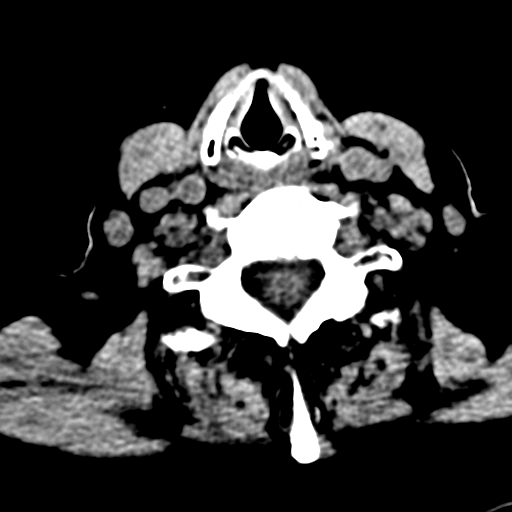
[im 39/84  brain]
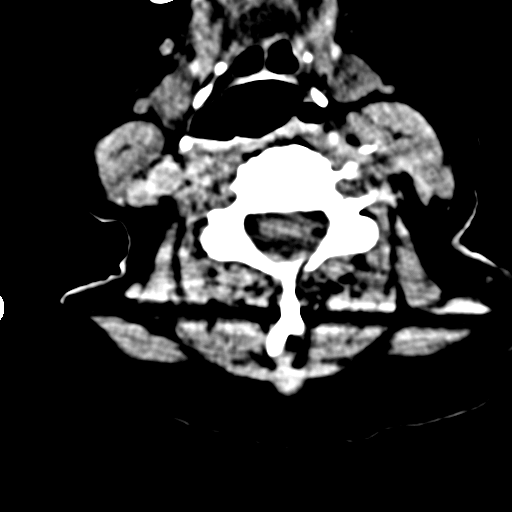
[im 45/84  brain]
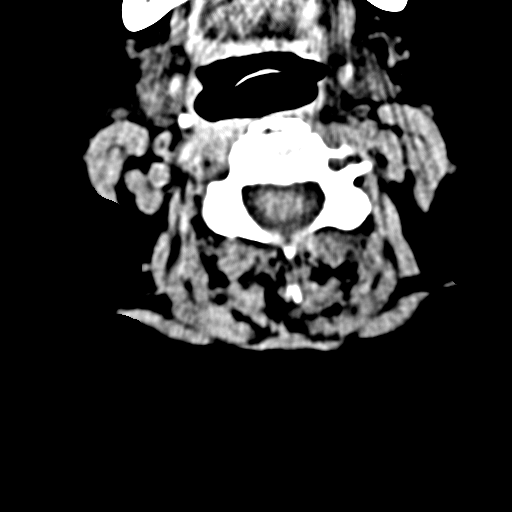
[im 56/84  brain]
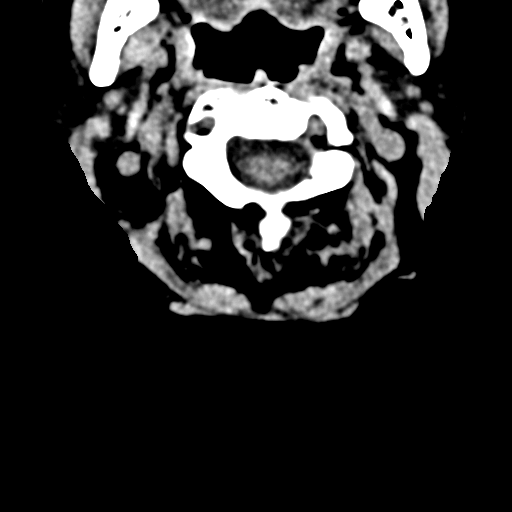
[im 67/84  brain]
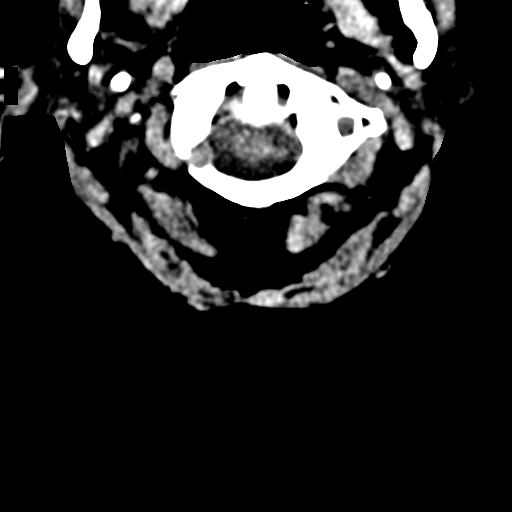
[im 78/84  brain]
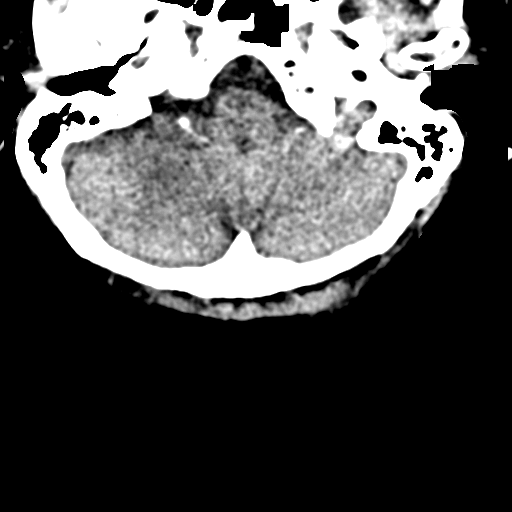

[18 of 30 positions shown; findings below may reference images not displayed]

FINDINGS: CT HEAD FINDINGS

No skull fracture is noted. Paranasal sinuses and mastoid air cells
are unremarkable. No intracranial hemorrhage, mass effect or midline
shift. Atherosclerotic calcifications of carotid siphon again noted.
No acute cortical infarction. No mass lesion is noted on this
unenhanced scan. Stable atrophy and chronic white matter disease. No
intraventricular hemorrhage.

CT CERVICAL SPINE FINDINGS

Axial images of the cervical spine shows no acute fracture or
subluxation. Computer processed images demonstrate no acute fracture
or subluxation. Mild degenerative changes C1-C2 articulation. Mild
disc space flattening at C4-C5 and C5-C6 level. No prevertebral soft
tissue swelling. Mild anterior spurring lower endplate of C4 and C5
vertebral body. No pneumothorax in visualized lung apices.

Bilateral apical scarring is noted.  Cervical airway is patent.
IMPRESSION: 1. No acute intracranial abnormality. Stable atrophy and chronic
white matter disease.
2. No cervical spine acute fracture or subluxation. Diffuse
osteopenia. Degenerative changes as described above. Bilateral lung
apices scarring.

## 2014-09-14 DIAGNOSIS — L820 Inflamed seborrheic keratosis: Secondary | ICD-10-CM | POA: Diagnosis not present

## 2014-09-14 DIAGNOSIS — L570 Actinic keratosis: Secondary | ICD-10-CM | POA: Diagnosis not present

## 2014-09-14 DIAGNOSIS — L814 Other melanin hyperpigmentation: Secondary | ICD-10-CM | POA: Diagnosis not present

## 2014-09-14 DIAGNOSIS — L578 Other skin changes due to chronic exposure to nonionizing radiation: Secondary | ICD-10-CM | POA: Diagnosis not present

## 2014-10-08 ENCOUNTER — Telehealth: Payer: Self-pay | Admitting: Neurology

## 2014-10-08 NOTE — Telephone Encounter (Signed)
As long as the patient is doing well with this regiment, is it ok for patient to get her medication 1 hour after eating?

## 2014-10-08 NOTE — Telephone Encounter (Signed)
Brittney RN at [facility] called stating daughter is requesting order for carbidopa-levodopa (SINEMET IR) 25-100 MG per tablet to given as directed. Patient is getting medication but is not necessarily taking it on time as it is being given to her 1 hr after eating. She is getting 5 x/day as instructed but her daughter is really upset with [facility] for waiting to give med 1hour after eating as this is the correct time for med to be taken. Her daughter wants it to be given to her as scheduled regardless if she has a meal or not. She can be reached at [phone].

## 2014-10-09 ENCOUNTER — Encounter: Payer: Self-pay | Admitting: Internal Medicine

## 2014-10-09 NOTE — Telephone Encounter (Signed)
I spoke to Manuela Schwartz earlier today and gave her VO below. I see that she has called back with further questions. I will call back asap.

## 2014-10-09 NOTE — Telephone Encounter (Signed)
Manuela Schwartz RN at [facility] called and requested to speak with Beverlee Nims RN regarding the previously discussed issue. Please call and advise. [phone]).

## 2014-10-09 NOTE — Telephone Encounter (Signed)
I spoke to Vanessa Mcbride and clarified orders and answered questions about medication administration. Clarified that medication needs to be given at least 81min before meal or 1 hour after to allow patient's body to absorb it.

## 2014-10-09 NOTE — Telephone Encounter (Signed)
Given the circumstances, what with other patients to be considered, her sleep-wake schedule, her meal times, etc. I think it is okay to continue with 5 times a day schedule, even 1 hour post-prandial. Pls give VO to RN, Network engineer at [facility].

## 2014-10-10 ENCOUNTER — Telehealth: Payer: Self-pay | Admitting: Neurology

## 2014-10-10 NOTE — Telephone Encounter (Signed)
I spoke to Vanessa Mcbride. Patient's daughter wants patient to take the Sinemet at the exact times that are prescribed regardless of meal times. He asked if Dr. Rexene Alberts would change Rx to say give Sinemet at these times.... Regardless of meal times. I discussed with Dr. Rexene Alberts and she will not change order. Medication will not be absorbed if not taken on an empty stomach. He voiced understanding. I stated that he is welcome to refer [name]'s daughter to [place] if any further question.

## 2014-10-10 NOTE — Telephone Encounter (Signed)
Vanessa Mcbride from [facility], called and would like to speak with RN about her sinemet dosage. carbidopa-levodopa (SINEMET IR) 25-100 MG per tablet. Please call and advise [phone]

## 2014-10-24 ENCOUNTER — Non-Acute Institutional Stay: Payer: Medicare Other | Admitting: Internal Medicine

## 2014-10-24 ENCOUNTER — Encounter: Payer: Self-pay | Admitting: Internal Medicine

## 2014-10-24 ENCOUNTER — Telehealth: Payer: Self-pay | Admitting: Neurology

## 2014-10-24 VITALS — BP 122/70 | HR 77 | Temp 97.5°F

## 2014-10-24 DIAGNOSIS — G20 Parkinson's disease: Secondary | ICD-10-CM

## 2014-10-24 DIAGNOSIS — R1310 Dysphagia, unspecified: Secondary | ICD-10-CM | POA: Diagnosis not present

## 2014-10-24 DIAGNOSIS — K5900 Constipation, unspecified: Secondary | ICD-10-CM | POA: Diagnosis not present

## 2014-10-24 DIAGNOSIS — I10 Essential (primary) hypertension: Secondary | ICD-10-CM

## 2014-10-24 DIAGNOSIS — I482 Chronic atrial fibrillation, unspecified: Secondary | ICD-10-CM

## 2014-10-24 DIAGNOSIS — R269 Unspecified abnormalities of gait and mobility: Secondary | ICD-10-CM

## 2014-10-24 DIAGNOSIS — K5909 Other constipation: Secondary | ICD-10-CM

## 2014-10-24 MED ORDER — CARBIDOPA-LEVODOPA 25-100 MG PO TABS: ORAL_TABLET | ORAL | Status: DC

## 2014-10-24 NOTE — Telephone Encounter (Signed)
I spoke to daughter. She has concerns about the times her mother is taking her Sinemet. Listening to her concerns, it seems that changing the 6pm dose to 5:15pm will help matters. Dr. Rexene Alberts is in agreement with this. I will fax orders to [facility].

## 2014-10-24 NOTE — Telephone Encounter (Signed)
Raquel Sarna called stating patient's daughter Angelie Kram wants to talk with Dr Rexene Alberts regarding the directions for carbidopa-levodopa (SINEMET IR) 25-100 MG per tablet .  Please call and advise. Keerat Denicola can be reached at [phone].

## 2014-10-24 NOTE — Progress Notes (Signed)
Patient ID: Vanessa Mcbride, female   DOB: 02/14/1925, 81 y.o.   MRN: [id number]   Location:  [facility]  Code Status: DNR  Goals of Care:Advanced Directive information Does patient have an advance directive?: Yes, Type of Advance Directive: [place];Living will;Out of facility DNR (pink MOST or yellow form), Pre-existing out of facility DNR order (yellow form or pink MOST form): Yellow form placed in chart (order not valid for inpatient use), Does patient want to make changes to advanced directive?: No - Patient declined  Chief Complaint  Patient presents with  . Medical Management of Chronic Issues    3 month follow-up     HPI: Patient is a 81 y.o. white female [facility] today for medical mgt of her chronic diseases primarily her advanced Parkinson's disease.  She also follows with neurology, Vanessa Mcbride.  Today, her daughter, Vanessa Mcbride, and her caregiver, Vanessa Mcbride, are with her.  She's been staying in rehab in transition from [place] to SNF care.    She has been here for 2.5 years.  [name] fell at home just after her husband passed.  Went to rehab here, went to [place] over 2 years.  Have been waiting for a SNF bed for over a year.  She is stying in rehab now.  Has 24 hour caregivers and a second in the evening to help as well.    PD:  Has progressed over past couple of years. Awaiting a call back from Vanessa Mcbride.  Timing of sinemet has been problematic with meals.  In [place] they adjusted some to this.    Had anaphylactic reaction to iodine.   HTN.  Now has some flexibility with pressures. Doing better with this last 6 mos.  Not too low or too high.    Otherwise had been quite healthy.   Is alert and oriented.   She is immobile and does not speak.  She does like some treas here and there despite her restricted diet.  She is very active.  Current challenge:  Medication getting harder to swallow.  Some being crushed.  May need liquid form.  Is on thickened  liquid.    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    Walks to meals with three individuals supporting her and her walker.    Review of Systems:  Review of Systems  Constitutional: Negative for fever and chills.  HENT: Negative for hearing loss.   Eyes: Negative for blurred vision.  Respiratory: Negative for shortness of breath.   Cardiovascular: Positive for leg swelling. Negative for chest pain.       Left greater than right  Gastrointestinal: Positive for constipation. Negative for heartburn, nausea, vomiting, abdominal pain, diarrhea, blood in stool and melena.  Genitourinary: Negative for dysuria.  Musculoskeletal: Negative for falls.  Neurological: Positive for weakness. Negative for dizziness and loss of consciousness.       Very rigid, right worse than left.  Has some contracture of right arm and hand  Endo/Heme/Allergies: Bruises/bleeds easily.  Psychiatric/Behavioral: Negative for memory loss.    Past Medical History  Diagnosis Date  . Parkinson's disease   . Hypertension   . Arthritis     osteoarthritis  . Scoliosis   . Memory loss 06/09/2012  . Aortic valve disorders 04/11/2012  . Dementia in conditions classified elsewhere without behavioral disturbance 04/07/2012  . Hyperlipidemia   . Osteoarthrosis, unspecified whether generalized or localized,  unspecified site   . Insomnia, unspecified 03/20/2012  . Fracture closed, humerus 03/20/2012  . Other closed fractures of distal end of radius (alone) 03/20/2012  . Transient ischemic attack (TIA), and cerebral infarction without residual deficits(V12.54)   . Unspecified constipation   . Debility, unspecified 03/18/2012  . Anxiety   . Generalized anxiety disorder   . Urinary incontinence 07/05/2013    Re: PD  . Atrial fibrillation     Past Surgical History  Procedure Laterality Date  . Breast surgery      benign lumpectomy  . Cholecystectomy      Social History:   reports that she  has never smoked. She has never used smokeless tobacco. She reports that she does not drink alcohol or use illicit drugs.  Allergies  Allergen Reactions  . Iodine Anaphylaxis  . Iohexol      Desc: pt has had contrast in the past (years ago) and it causea anaphylaxis- per pt's daughter.  stephanie davis,rtrct, Onset Date: [id number]     Medications: Patient's Medications  New Prescriptions   No medications on file  Previous Medications   ACETAMINOPHEN (TYLENOL) 500 MG TABLET    Take 500 mg by mouth. Take twice daily   ASPIRIN 81 MG TABLET    Take 81 mg by mouth daily.   CARBIDOPA-LEVODOPA (SINEMET IR) 25-100 MG PER TABLET    Take 1 1/2 tablet 5 times a day, at 6 am, 10 am, 2 pm, 6 pm and bedtime.  Stop Sinemet CR.   CLONIDINE (CATAPRES) 0.1 MG TABLET    Take 0.1 mg by mouth. Take one only if SBP 170 or greater; every 6 hours as needed   DICLOFENAC SODIUM (VOLTAREN) 1 % GEL    Apply 2 g topically. Use 2 grams to each shoulder three times daily to reduce pain   HYDRALAZINE (APRESOLINE) 50 MG TABLET    Take 50 mg by mouth. Take one twice daily hold for systolic <364   POLYETHYLENE GLYCOL (MIRALAX / GLYCOLAX) PACKET    Take 17 g by mouth daily as needed.  Modified Medications   No medications on file  Discontinued Medications   BISACODYL (DULCOLAX) 10 MG SUPPOSITORY    Place 10 mg rectally. One suppository as needed if no BM in two days     Physical Exam: Filed Vitals:   10/24/14 1525  BP: 122/70  Pulse: 77  Temp: 97.5 F (36.4 C)  TempSrc: Oral  SpO2: 96%   There is no weight on file to calculate BMI. Physical Exam  Constitutional: She appears well-developed and well-nourished. No distress.  HENT:  Head: Normocephalic and atraumatic.  Cardiovascular: Normal rate, regular rhythm, normal heart sounds and intact distal pulses.   Pulmonary/Chest: Effort normal and breath sounds normal. No respiratory distress.  Abdominal: Soft. Bowel sounds are normal. She exhibits no distension.  There is no tenderness.  Neurological: She is alert. She exhibits abnormal muscle tone.  Severe cogwheeling rigidity of bilateral arms, but right greater than left; legs similarly rigid  Psychiatric: Her behavior is normal.  Somewhat flat affect--does occasionally laugh and smile, but facial expression otherwise flat     Labs reviewed: Basic Metabolic Panel:  Recent Labs  11/20/13 2001 11/21/13 0605 11/23/13 0249  NA 136* 138 138  K 3.9 3.6* 4.1  CL 94* 99 100  CO2 29 27 28   GLUCOSE 138* 147* 106*  BUN 12 12 17   CREATININE 0.46* 0.41* 0.35*  CALCIUM 9.6 8.9 8.3*  MG  --  2.5  --   PHOS  --  2.7  --   TSH  --  1.470  --    Liver Function Tests:  Recent Labs  11/21/13 0605  AST 9  ALT <5  ALKPHOS 55  BILITOT 1.0  PROT 6.7  ALBUMIN 3.4*   No results for input(s): LIPASE, AMYLASE in the last 8760 hours. No results for input(s): AMMONIA in the last 8760 hours. CBC:  Recent Labs  11/20/13 2001 11/21/13 0605  WBC 10.6* 10.4  HGB 14.9 14.4  HCT 44.4 43.5  MCV 94.9 96.5  PLT 210 212   Patient Care Team: [name], DO as PCP - General (Geriatric Medicine) [facility] Star Age, MD as Attending Physician (Neurology) [name], NP as Nurse Practitioner (Neurology)  Assessment/Plan 1. Parkinson's disease -will be a bit more liberal about dosing of sinemet--Vanessa Mcbride made some adjustments to accomodate about the evening meal so she does not miss doses  2. Dysphagia -cont thickened liquids -having more difficulty and meds will soon need to be made dissolvable or liquid as many are getting crushed so she can swallow them as her PD progresses  3. Essential hypertension -bp sometimes running low so hold parameters were added to the hydralazine to accommodate this  4. Chronic constipation -caregivers have this under control with current regimen and occasional suppository when she still does not have a bm  5. Chronic atrial  fibrillation -on baby asa only due to serious fall risk; rate currently controlled  6. Abnormality of gait -cont her use of walker and multiple assistants to ambulate -elevate feet for 15 mins before or after lunch to help with edema  Labs/tests ordered:  No new today Next appt:  Will be moving to skilled and get monthly visits  Rjay Revolorio L. Kitt Minardi, D.O. [facility] [address], [place] [address] Cell Phone (Mon-Fri 8am-5pm):  [phone] On Call:  [phone] & follow prompts after 5pm & weekends Office Phone:  [phone] Office Fax:  [phone]

## 2014-10-25 ENCOUNTER — Ambulatory Visit: Payer: Medicare Other | Admitting: Neurology

## 2014-10-30 DIAGNOSIS — R1312 Dysphagia, oropharyngeal phase: Secondary | ICD-10-CM | POA: Diagnosis not present

## 2014-10-30 DIAGNOSIS — R293 Abnormal posture: Secondary | ICD-10-CM | POA: Diagnosis not present

## 2014-10-30 DIAGNOSIS — G8929 Other chronic pain: Secondary | ICD-10-CM | POA: Diagnosis not present

## 2014-10-30 DIAGNOSIS — M6281 Muscle weakness (generalized): Secondary | ICD-10-CM | POA: Diagnosis not present

## 2014-10-30 DIAGNOSIS — G20 Parkinson's disease: Secondary | ICD-10-CM | POA: Diagnosis not present

## 2014-10-31 ENCOUNTER — Ambulatory Visit (INDEPENDENT_AMBULATORY_CARE_PROVIDER_SITE_OTHER): Payer: Medicare Other | Admitting: Neurology

## 2014-10-31 ENCOUNTER — Encounter: Payer: Self-pay | Admitting: Neurology

## 2014-10-31 VITALS — BP 132/68 | HR 68 | Resp 14

## 2014-10-31 DIAGNOSIS — R54 Age-related physical debility: Secondary | ICD-10-CM | POA: Diagnosis not present

## 2014-10-31 DIAGNOSIS — R413 Other amnesia: Secondary | ICD-10-CM

## 2014-10-31 DIAGNOSIS — G20A1 Parkinson's disease without dyskinesia, without mention of fluctuations: Secondary | ICD-10-CM

## 2014-10-31 DIAGNOSIS — R5381 Other malaise: Secondary | ICD-10-CM

## 2014-10-31 DIAGNOSIS — G20 Parkinson's disease: Secondary | ICD-10-CM | POA: Diagnosis not present

## 2014-10-31 NOTE — Patient Instructions (Addendum)
We will continue with Sinemet 1-1/2 pills 5 times a day. It can be crushed and mixed with applesauce. Okay to have some "wiggle room" with the dose times, generally, try to have a 4 hourly interval between doses and try not to withhold doses. Monitor BP daily.  Follow up in 3-4 months.

## 2014-10-31 NOTE — Progress Notes (Signed)
Subjective:    Patient ID: Vanessa Mcbride is a 81 y.o. female.  HPI     Interim history:   Ms. Buzan is a very friendly 81 year old right-handed woman with an underlying medical history of hyperlipidemia, hypertension, osteoarthritis, insomnia, memory loss, anxiety and history of fall with fracture of her left arm in 2014, who presents for followup consultation of her right-sided predominant Parkinson's disease. She is accompanied y one of her personal caretakers from [place], Vanessa Mcbride, and her Daughter Vanessa Mcbride) again today. I last saw her on 07/20/2014, at which time her daughter reported that things were relatively stable. The patient was tolerating her 5 doses of Sinemet daily, 1-1/2 pills each dose. She continued to have 2 person assist when standing and was no longer able to walk on her own. She had no recent falls. Overall there had been gradual decline in her condition.   In the interim, her daughter called last month for clarification of the timing of her Sinemet. I suggested we give her more flexibility with her dosing because of timing of other medications, her sleep and wake schedule, and her meal schedule.   Today, 10/31/2014: She is non-verbal. She did say "okay" very slowly in the beginning of our visit. Per daughter, she is in skilled nursing rehab from assisted living because of the increased level of care needed. She was seen by her primary care physician, Vanessa Mcbride, on 10/24/2014 and I reviewed the office note. Her daughter reports that she is fairly stable. She just has taken an overall gradual and steady decline. She has trouble swallowing. They have to crush the Sinemet and give it with applesauce. She seems to be able to take it that way reasonably well. She has more lower extremity edema.   Previously:  I saw her on 04/23/2014, at which time her daughter reported that her weight had stabilized after she had lost about 10 pounds. She had had some increased blood pressure values  in her hydralazine was increased to twice daily. She was needing a 2 person assist with walking. She had not fallen. She was becoming less and less verbal however. She was no longer on Sinemet CR at night and was on sinemet immediate release 25-100 milligrams strength 1-1/2 pills 4 times a day. I suggested we restart Sinemet CR at bedtime.  In the interim, her daughter called on 07/05/2014 reporting that the patient was having difficulty swallowing her medications. I suggested we start crushing her Sinemet and giving it with applesauce but this could not be done with a CR. Therefore, I suggested we increase her immediate release Sinemet to 1-1/2 pills 5 times a day and do away with a CR at night.    I saw her on 11/07/2013, at which time her daughter worried about her weight gain. She was having difficulty maintaining sleep. Melatonin and amlodipine had been stopped. Her primary care physician prescribed Belsomra, but her daughter was not comfortable with her taking a sleeping pill and it was not started. She was having more exhaustion and fatigue. She had more trouble walking and was getting slower. She had to get up to use the bathroom 2 or 3 times per night. Her daughter resigned from her job to be closer to her mother and be with her mother more often.   I suggested we increase her Sinemet to 1-1/2 pills 4 times a day and we also added a Sinemet CR at bedtime. Unfortunately, in the interim, the patient fell on 11/20/2013. She was  found to have a fractured vertebra. She was also found to be in new onset A. fib with rapid RVR. Unfortunately, this hospital stay left her more debilitated and frail. She needed 2 person assist.  In the interim, she was seen by our nurse practitioner, Ms. Lam, on 01/08/2014, at which time I also saw the patient. We kept her medications the same.   I saw her on 04/25/2013, at which time I felt that her symptoms were worse with respect to her walking and her tremors. I  suggested a slight increase in her Sinemet to alternating 1-1/2 pills with one pill for a total of 4 doses and a total of 5 pills. Her caretakers reported that she snored and had had some apneic pauses in her breathing. I suggested a sleep study but they suggested that he check with the patient's daughter and call back with regards to doing a sleep study. In the interim, she was seen by our nurse practitioner, Ms. Lam on 07/20/2013 at which time she was fairly stable and the medications were kept the same.   I saw her on 12/23/2012, and which time I increased her Sinemet to 4 times daily from tid, as I felt that she had worsened since her previous visit. She reported worse memory, poor sleep, and occasional muscle jerks. I continued her on 1-1/2 pills for the first dose and one pill each time for the last 3 doses.   I saw her on 09/21/2012, at which time I increased her morning dose of Sinemet to 1-1/2 pills and otherwise she was to continue her midday dose at one pill and her afternoon dose at one pill. Prior to that I saw her on 06/09/2012. I did not make any medication changes in 3/14. Her primary care physician encouraged her to use her walker or cane but she does not like to use them. She holds onto her caretaker.    Her Past Medical History Is Significant For: Past Medical History  Diagnosis Date  . Parkinson's disease   . Hypertension   . Arthritis     osteoarthritis  . Scoliosis   . Memory loss 06/09/2012  . Aortic valve disorders 04/11/2012  . Dementia in conditions classified elsewhere without behavioral disturbance 04/07/2012  . Hyperlipidemia   . Osteoarthrosis, unspecified whether generalized or localized, unspecified site   . Insomnia, unspecified 03/20/2012  . Fracture closed, humerus 03/20/2012  . Other closed fractures of distal end of radius (alone) 03/20/2012  . Transient ischemic attack (TIA), and cerebral infarction without residual deficits(V12.54)   . Unspecified  constipation   . Debility, unspecified 03/18/2012  . Anxiety   . Generalized anxiety disorder   . Urinary incontinence 07/05/2013    Re: PD  . Atrial fibrillation     Her Past Surgical History Is Significant For: Past Surgical History  Procedure Laterality Date  . Breast surgery      benign lumpectomy  . Cholecystectomy      Her Family History Is Significant For: Family History  Problem Relation Age of Onset  . Heart disease Son     CAD  . Diabetes Mother   . Diabetes Sister   . Hypertension Sister     Her Social History Is Significant For: Social History   Social History  . Marital Status: Widowed    Spouse Name: N/A  . Number of Children: 2  . Years of Education: college   Occupational History  . retired Armed forces operational officer    Social History  Main Topics  . Smoking status: Never Smoker   . Smokeless tobacco: Never Used  . Alcohol Use: No  . Drug Use: No  . Sexual Activity: No   Other Topics Concern  . None   Social History Narrative   Widowed 02/2012 (after 13 yr marriage). [place], understands English fairly well, speaks English poorly. Occupation: Retired Armed forces operational officer. Moved form [place] to [place] 03/2012,     Lives at [employer],  [place] section. Has 24 hour caregivers.   Advanced Directives:  DNR, MOST (05/2012)        Her Allergies Are:  Allergies  Allergen Reactions  . Iodine Anaphylaxis  . Iohexol      Desc: pt has had contrast in the past (years ago) and it causea anaphylaxis- per pt's daughter.  stephanie davis,rtrct, Onset Date: [id number]   :   Her Current Medications Are:  Outpatient Encounter Prescriptions as of 10/31/2014  Medication Sig  . acetaminophen (TYLENOL) 500 MG tablet Take 500 mg by mouth. Take twice daily  . aspirin 81 MG tablet Take 81 mg by mouth daily.  . Carbidopa 25 MG tablet 1 1/2 BY MOUTH AT BEDTIME  . carbidopa-levodopa (SINEMET IR) 25-100 MG per tablet Take 1 1/2 tablet 5 times a day, at 6  am, 10 am, 2 pm, 5:15 pm and bedtime.  . cloNIDine (CATAPRES) 0.1 MG tablet Take 0.1 mg by mouth. Take one only if SBP 170 or greater; every 6 hours as needed  . diclofenac sodium (VOLTAREN) 1 % GEL Apply 2 g topically. Use 2 grams to each shoulder three times daily to reduce pain  . hydrALAZINE (APRESOLINE) 50 MG tablet 50 mg. Take one three times daily hold for systolic <858  . magnesium hydroxide (MILK OF MAGNESIA) 400 MG/5ML suspension Take 45 mLs by mouth daily as needed for mild constipation.  . polyethylene glycol (MIRALAX / GLYCOLAX) packet Take 17 g by mouth daily as needed.  . sennosides-docusate sodium (SENOKOT-S) 8.6-50 MG tablet Take 2 tablets by mouth at bedtime.   No facility-administered encounter medications on file as of 10/31/2014.  :  Review of Systems:  Out of a complete 14 point review of systems, all are reviewed and negative with the exception of these symptoms as listed below:   Review of Systems  Neurological:       Daughter reports that patient is having trouble swallowing, therefore her Sinemet is being crushed, daughter asks if it come in liquid form?   Overall the daughter feels like the patient is "stable".     Objective:  Neurologic Exam  Physical Exam Physical Examination:   Filed Vitals:   10/31/14 1127  BP: 132/68  Mcbride: 68  Resp: 14    General Examination: The patient is a very pleasant 81 y.o. female in no acute distress. She is essentially nonverbal. She is situated in her wheelchair. She has her eyes closed at times, she seems to have apraxia of eyelid opening.   HEENT exam: Normocephalic, atraumatic, moderate facial masking is noted and she keeps her mouth open more. She has no lip, neck or jaw tremor. Neck tone is moderately to severely elevated. She has decrease passive ROM in her neck. Hearing seems intact. Speech is minimal, but severely hypophonic and mildly dysarthric. Oropharynx exam reveals: mild mouth dryness, no drooling. Pupils  are reactive to light and extraocular tracking is fair with moderate saccadic breakdown of smooth pursuit. Chest is clear to auscultation with no wheezing or rhonchi or  crackles.  Heart sounds are normal without murmurs, rubs or gallops. Heart sounds are regular. Abdomen is soft, nontender. Bowel sounds are appreciated. Extremities: She has b/l, L>R pitting edema in the distal LEs, 1+ to 2+.  Neurologically: Mental status: The patient is awake, not fully alert and oriented to self only. She only says OK once. She has evidence of significant bradyphrenia with quite a long delay in her responses or visual tracking. Cranial nerves are as described under HEENT exam. Motor exam: She a global strength of 4/5. She has minimal spontaneous movements. She's not able to mimic movements or follow verbal commands. Tone is overall increased. Bradykinesia seems severe. She does have cogwheeling in both upper extremities. She has no resting tremor. Fine motor skills are not really possible to be assessed at this time she's not able to stand or walk. Sensory exam appears to be intact to light touch throughout and reflexes are about 1+ throughout. She's not able to do finger to nose testing or heel-to-shin testing.   Assessment and plan:    In summary, Ms. Sonnen is a very pleasant 81 year old lady with an underlying medical history of hyperlipidemia, hypertension, osteoarthritis, insomnia, memory loss, anxiety and history of fall with fracture of her left arm in 2014 and more recent Dx of of a fib with RVR, who presents for follow-up consultation of her advanced, right-sided predominant Parkinson's disease, complicated by constipation and even obstipation, advancing age, swallowing difficulty, scant speech, swelling of legs, memory loss, and overall frailty and falls with injuries, as well as overall deconditioning. She has had progression of her symptoms over the last few visits. I again discussed her challenges with her  daughter and caregiver, Vanessa Mcbride. I suggested we continue with Sinemet 1-1/2 pills 5 times a day at this time, as this was better than 4 times a day and she could not swallow the Sinemet CR 50-200 mg, as this cannot be crushed. They will continue to try to mobilize her with maximum assist, typically with 2 caretakers. Sinemet can be crushed and given with applesauce. We mutually agreed to have some leeway as far as the dose times so she can get her 5 doses in, rather than skipping doses. I would like see her back in about 3 to 4 months, sooner if needed. I answered all their questions today. I filled out her paperwork from her SNF and provided them with written instructions. I spent 20 min in total face-to-face time with the patient, more 50% of which was spent in counseling and coordination of care, reviewing test results, reviewing medication and reviewing the diagnosis of advanced PD, its prognosis and treatment options.

## 2014-11-01 DIAGNOSIS — G8929 Other chronic pain: Secondary | ICD-10-CM | POA: Diagnosis not present

## 2014-11-01 DIAGNOSIS — R293 Abnormal posture: Secondary | ICD-10-CM | POA: Diagnosis not present

## 2014-11-01 DIAGNOSIS — R1312 Dysphagia, oropharyngeal phase: Secondary | ICD-10-CM | POA: Diagnosis not present

## 2014-11-01 DIAGNOSIS — M6281 Muscle weakness (generalized): Secondary | ICD-10-CM | POA: Diagnosis not present

## 2014-11-01 DIAGNOSIS — G20 Parkinson's disease: Secondary | ICD-10-CM | POA: Diagnosis not present

## 2014-11-05 DIAGNOSIS — R293 Abnormal posture: Secondary | ICD-10-CM | POA: Diagnosis not present

## 2014-11-05 DIAGNOSIS — R1312 Dysphagia, oropharyngeal phase: Secondary | ICD-10-CM | POA: Diagnosis not present

## 2014-11-05 DIAGNOSIS — G20 Parkinson's disease: Secondary | ICD-10-CM | POA: Diagnosis not present

## 2014-11-05 DIAGNOSIS — G8929 Other chronic pain: Secondary | ICD-10-CM | POA: Diagnosis not present

## 2014-11-05 DIAGNOSIS — M6281 Muscle weakness (generalized): Secondary | ICD-10-CM | POA: Diagnosis not present

## 2014-11-07 DIAGNOSIS — R293 Abnormal posture: Secondary | ICD-10-CM | POA: Diagnosis not present

## 2014-11-07 DIAGNOSIS — M6281 Muscle weakness (generalized): Secondary | ICD-10-CM | POA: Diagnosis not present

## 2014-11-07 DIAGNOSIS — G8929 Other chronic pain: Secondary | ICD-10-CM | POA: Diagnosis not present

## 2014-11-07 DIAGNOSIS — G20 Parkinson's disease: Secondary | ICD-10-CM | POA: Diagnosis not present

## 2014-11-07 DIAGNOSIS — R1312 Dysphagia, oropharyngeal phase: Secondary | ICD-10-CM | POA: Diagnosis not present

## 2014-11-12 DIAGNOSIS — G8929 Other chronic pain: Secondary | ICD-10-CM | POA: Diagnosis not present

## 2014-11-12 DIAGNOSIS — R1312 Dysphagia, oropharyngeal phase: Secondary | ICD-10-CM | POA: Diagnosis not present

## 2014-11-12 DIAGNOSIS — M6281 Muscle weakness (generalized): Secondary | ICD-10-CM | POA: Diagnosis not present

## 2014-11-12 DIAGNOSIS — R293 Abnormal posture: Secondary | ICD-10-CM | POA: Diagnosis not present

## 2014-11-12 DIAGNOSIS — G20 Parkinson's disease: Secondary | ICD-10-CM | POA: Diagnosis not present

## 2014-11-15 DIAGNOSIS — G8929 Other chronic pain: Secondary | ICD-10-CM | POA: Diagnosis not present

## 2014-11-15 DIAGNOSIS — R1312 Dysphagia, oropharyngeal phase: Secondary | ICD-10-CM | POA: Diagnosis not present

## 2014-11-15 DIAGNOSIS — R293 Abnormal posture: Secondary | ICD-10-CM | POA: Diagnosis not present

## 2014-11-15 DIAGNOSIS — G20 Parkinson's disease: Secondary | ICD-10-CM | POA: Diagnosis not present

## 2014-11-15 DIAGNOSIS — M6281 Muscle weakness (generalized): Secondary | ICD-10-CM | POA: Diagnosis not present

## 2014-11-22 DIAGNOSIS — G8929 Other chronic pain: Secondary | ICD-10-CM | POA: Diagnosis not present

## 2014-11-22 DIAGNOSIS — G20 Parkinson's disease: Secondary | ICD-10-CM | POA: Diagnosis not present

## 2014-11-22 DIAGNOSIS — R293 Abnormal posture: Secondary | ICD-10-CM | POA: Diagnosis not present

## 2014-11-22 DIAGNOSIS — M6281 Muscle weakness (generalized): Secondary | ICD-10-CM | POA: Diagnosis not present

## 2014-11-27 DIAGNOSIS — R293 Abnormal posture: Secondary | ICD-10-CM | POA: Diagnosis not present

## 2014-11-27 DIAGNOSIS — G20 Parkinson's disease: Secondary | ICD-10-CM | POA: Diagnosis not present

## 2014-11-27 DIAGNOSIS — G8929 Other chronic pain: Secondary | ICD-10-CM | POA: Diagnosis not present

## 2014-11-27 DIAGNOSIS — M6281 Muscle weakness (generalized): Secondary | ICD-10-CM | POA: Diagnosis not present

## 2014-11-28 DIAGNOSIS — M6281 Muscle weakness (generalized): Secondary | ICD-10-CM | POA: Diagnosis not present

## 2014-11-28 DIAGNOSIS — G8929 Other chronic pain: Secondary | ICD-10-CM | POA: Diagnosis not present

## 2014-11-28 DIAGNOSIS — R293 Abnormal posture: Secondary | ICD-10-CM | POA: Diagnosis not present

## 2014-11-28 DIAGNOSIS — G20 Parkinson's disease: Secondary | ICD-10-CM | POA: Diagnosis not present

## 2014-11-29 DIAGNOSIS — G20 Parkinson's disease: Secondary | ICD-10-CM | POA: Diagnosis not present

## 2014-11-29 DIAGNOSIS — R293 Abnormal posture: Secondary | ICD-10-CM | POA: Diagnosis not present

## 2014-11-29 DIAGNOSIS — M6281 Muscle weakness (generalized): Secondary | ICD-10-CM | POA: Diagnosis not present

## 2014-11-29 DIAGNOSIS — G8929 Other chronic pain: Secondary | ICD-10-CM | POA: Diagnosis not present

## 2014-12-04 DIAGNOSIS — G20 Parkinson's disease: Secondary | ICD-10-CM | POA: Diagnosis not present

## 2014-12-04 DIAGNOSIS — G8929 Other chronic pain: Secondary | ICD-10-CM | POA: Diagnosis not present

## 2014-12-04 DIAGNOSIS — M6281 Muscle weakness (generalized): Secondary | ICD-10-CM | POA: Diagnosis not present

## 2014-12-04 DIAGNOSIS — R293 Abnormal posture: Secondary | ICD-10-CM | POA: Diagnosis not present

## 2014-12-10 DIAGNOSIS — M6281 Muscle weakness (generalized): Secondary | ICD-10-CM | POA: Diagnosis not present

## 2014-12-10 DIAGNOSIS — R293 Abnormal posture: Secondary | ICD-10-CM | POA: Diagnosis not present

## 2014-12-10 DIAGNOSIS — G8929 Other chronic pain: Secondary | ICD-10-CM | POA: Diagnosis not present

## 2014-12-10 DIAGNOSIS — G20 Parkinson's disease: Secondary | ICD-10-CM | POA: Diagnosis not present

## 2014-12-12 DIAGNOSIS — R293 Abnormal posture: Secondary | ICD-10-CM | POA: Diagnosis not present

## 2014-12-12 DIAGNOSIS — G8929 Other chronic pain: Secondary | ICD-10-CM | POA: Diagnosis not present

## 2014-12-12 DIAGNOSIS — M6281 Muscle weakness (generalized): Secondary | ICD-10-CM | POA: Diagnosis not present

## 2014-12-12 DIAGNOSIS — G20 Parkinson's disease: Secondary | ICD-10-CM | POA: Diagnosis not present

## 2014-12-13 ENCOUNTER — Non-Acute Institutional Stay (SKILLED_NURSING_FACILITY): Payer: Medicare Other | Admitting: Adult Health

## 2014-12-13 ENCOUNTER — Encounter: Payer: Self-pay | Admitting: Adult Health

## 2014-12-13 DIAGNOSIS — M1990 Unspecified osteoarthritis, unspecified site: Secondary | ICD-10-CM

## 2014-12-13 DIAGNOSIS — G20 Parkinson's disease: Secondary | ICD-10-CM

## 2014-12-13 DIAGNOSIS — F0280 Dementia in other diseases classified elsewhere without behavioral disturbance: Secondary | ICD-10-CM | POA: Diagnosis not present

## 2014-12-13 DIAGNOSIS — I10 Essential (primary) hypertension: Secondary | ICD-10-CM

## 2014-12-13 DIAGNOSIS — I4891 Unspecified atrial fibrillation: Secondary | ICD-10-CM | POA: Diagnosis not present

## 2014-12-13 DIAGNOSIS — R2689 Other abnormalities of gait and mobility: Secondary | ICD-10-CM | POA: Diagnosis not present

## 2014-12-13 DIAGNOSIS — K5900 Constipation, unspecified: Secondary | ICD-10-CM | POA: Diagnosis not present

## 2014-12-13 DIAGNOSIS — M6281 Muscle weakness (generalized): Secondary | ICD-10-CM | POA: Diagnosis not present

## 2014-12-13 NOTE — Progress Notes (Signed)
Patient ID: Vanessa Mcbride, female   DOB: 1924-11-08, 81 y.o.   MRN: [id number]     Nursing Home Location:  [place]   Code Status: DNR, most form  Patient Care Team: [name], DO as PCP - General (Geriatric Medicine) [facility] Age, MD as Attending Physician (Neurology) [name], NP as Nurse Practitioner (Neurology)   Place of Service: SNF (31)  Chief Complaint  Patient presents with  . Medical Management of Chronic Issues    HPI:  81 y.o.  female residing at [facility], skilled care section. I am here to review her chronic medical issues. She has a hx of afib, htn, constipation, PD, dementia, and OA. There are no complaints regarding her care today.  She has lost 7 lbs in the past year.  She has required more care and was moved to skilled care this month.  She remains on a walking program but requires two person assist.  She is on puree with nectar thick liquids. The nsg notes indicate there was an episode of pocketing food but there are no issues with aspiration pna at this point.      Review of Systems:  Review of Systems  Unable to perform ROS: Dementia    Medications: Patient's Medications  New Prescriptions   No medications on file  Previous Medications   ACETAMINOPHEN (TYLENOL) 500 MG TABLET    Take 500 mg by mouth. Take twice daily   ASPIRIN 81 MG TABLET    Take 81 mg by mouth daily.   CARBIDOPA 25 MG TABLET    1 1/2 BY MOUTH AT BEDTIME   CARBIDOPA-LEVODOPA (SINEMET IR) 25-100 MG PER TABLET    Take 1 1/2 tablet 5 times a day, at 6 am, 10 am, 2 pm, 5:15 pm and bedtime.   CLONIDINE (CATAPRES) 0.1 MG TABLET    Take 0.1 mg by mouth. Take one only if SBP 170 or greater; every 6 hours as needed   DICLOFENAC SODIUM (VOLTAREN) 1 % GEL    Apply 2 g topically. Use 2 grams to each shoulder three times daily to reduce pain   HYDRALAZINE (APRESOLINE) 50 MG TABLET    50 mg. Take one three times  daily hold for systolic <703   MAGNESIUM HYDROXIDE (MILK OF MAGNESIA) 400 MG/5ML SUSPENSION    Take 45 mLs by mouth daily as needed for mild constipation.   POLYETHYLENE GLYCOL (MIRALAX / GLYCOLAX) PACKET    Take 17 g by mouth daily as needed.   SENNOSIDES-DOCUSATE SODIUM (SENOKOT-S) 8.6-50 MG TABLET    Take 2 tablets by mouth at bedtime.  Modified Medications   No medications on file  Discontinued Medications   No medications on file     Physical Exam:  Filed Vitals:   12/13/14 1550  BP: 145/90  Pulse: 68  Temp: 96.9 F (36.1 C)  Resp: 22  Weight: 143 lb 11.2 oz (65.182 kg)  SpO2: 96%    Physical Exam  Constitutional: No distress.  HENT:  Head: Normocephalic and atraumatic.  Nose: Nose normal.  Refused oral exam  Neck: No JVD present. No thyromegaly present.  Cardiovascular: Normal rate and regular rhythm.   No murmur heard. No edema  Pulmonary/Chest: Effort normal and breath sounds normal. No respiratory distress.  Abdominal: Soft. Bowel sounds are normal. She exhibits no distension.  Musculoskeletal:  Rigidity noted to arms and legs  Neurological: She is alert.  No verbal and does not f/c  Skin: Skin is warm and dry. She is not diaphoretic.    Wt Readings from Last 3 Encounters:  12/13/14 143 lb 11.2 oz (65.182 kg)  12/26/13 147 lb (66.679 kg)  12/01/13 150 lb 12.8 oz (68.402 kg)     Labs reviewed/Significant Diagnostic Results:  Basic Metabolic Panel: No results for input(s): NA, K, CL, CO2, GLUCOSE, BUN, CREATININE, CALCIUM, MG, PHOS in the last 8760 hours. Liver Function Tests: No results for input(s): AST, ALT, ALKPHOS, BILITOT, PROT, ALBUMIN in the last 8760 hours. No results for input(s): LIPASE, AMYLASE in the last 8760 hours. No results for input(s): AMMONIA in the last 8760 hours. CBC: No results for input(s): WBC, NEUTROABS, HGB, HCT, MCV, PLT in the last 8760 hours. CBG: No results for input(s): GLUCAP in the last 8760 hours. TSH: No  results for input(s): TSH in the last 8760 hours. A1C: No results found for: HGBA1C Lipid Panel: No results for input(s): CHOL, HDL, LDLCALC, TRIG, CHOLHDL, LDLDIRECT in the last 8760 hours.     Assessment/Plan  1. Essential hypertension -controlled, goal <150/90 -continue current med  2. Atrial fibrillation with RVR -she has a hx of this but her rate is controlled and regular today with out meds -no further intervention given her debility  3. Constipation, unspecified constipation type -normal and regular BMs per the staff  4. Parkinson disease -progressive function decline -followed by Dr. Rexene Alberts, continue current meds   5. Osteoarthritis, unspecified osteoarthritis type, unspecified site -no signs of pain, continue scheduled tylenol    Labs/tests ordered CBC, TSH, and BMP   [name], ANP [facility] [phone]

## 2014-12-18 DIAGNOSIS — I10 Essential (primary) hypertension: Secondary | ICD-10-CM | POA: Diagnosis not present

## 2014-12-18 DIAGNOSIS — R634 Abnormal weight loss: Secondary | ICD-10-CM | POA: Diagnosis not present

## 2014-12-18 LAB — TSH: TSH: 2.21 u[IU]/mL (ref 0.41–5.90)

## 2014-12-18 LAB — CBC AND DIFFERENTIAL
HEMATOCRIT: 38 % (ref 36–46)
HEMOGLOBIN: 13 g/dL (ref 12.0–16.0)
Platelets: 247 10*3/uL (ref 150–399)
WBC: 4.5 10*3/mL

## 2014-12-18 LAB — BASIC METABOLIC PANEL
BUN: 14 mg/dL (ref 4–21)
Creatinine: 0.4 mg/dL — AB (ref 0.5–1.1)
GLUCOSE: 95 mg/dL
Potassium: 4.2 mmol/L (ref 3.4–5.3)
SODIUM: 136 mmol/L — AB (ref 137–147)

## 2014-12-26 DIAGNOSIS — M6281 Muscle weakness (generalized): Secondary | ICD-10-CM | POA: Diagnosis not present

## 2014-12-26 DIAGNOSIS — Z23 Encounter for immunization: Secondary | ICD-10-CM | POA: Diagnosis not present

## 2014-12-26 DIAGNOSIS — G20 Parkinson's disease: Secondary | ICD-10-CM | POA: Diagnosis not present

## 2014-12-26 DIAGNOSIS — R2689 Other abnormalities of gait and mobility: Secondary | ICD-10-CM | POA: Diagnosis not present

## 2014-12-26 DIAGNOSIS — M1990 Unspecified osteoarthritis, unspecified site: Secondary | ICD-10-CM | POA: Diagnosis not present

## 2014-12-26 DIAGNOSIS — F0280 Dementia in other diseases classified elsewhere without behavioral disturbance: Secondary | ICD-10-CM | POA: Diagnosis not present

## 2014-12-27 ENCOUNTER — Non-Acute Institutional Stay (SKILLED_NURSING_FACILITY): Payer: Medicare Other | Admitting: Adult Health

## 2014-12-27 ENCOUNTER — Encounter: Payer: Self-pay | Admitting: Adult Health

## 2014-12-27 DIAGNOSIS — N898 Other specified noninflammatory disorders of vagina: Secondary | ICD-10-CM | POA: Diagnosis not present

## 2014-12-27 NOTE — Progress Notes (Signed)
Patient ID: Vanessa Mcbride, female   DOB: 01-13-1925, 81 y.o.   MRN: [id number]     Nursing Home Location:  [place]   Code Status: DNR  Patient Care Team: [name], DO as PCP - General (Geriatric Medicine) [facility] Star Age, MD as Attending Physician (Neurology) [name], NP as Nurse Practitioner (Neurology)   Place of Service: SNF (31)  Chief Complaint  Patient presents with  . Acute Visit    vaginal discharge    HPI:  81 y.o.female residing at [facility], skilled care section, that I was asked to see for complaints of vaginal discharge for 3 days. She has not had a fever or other associated symptoms per her caregivers. She is non verbal and has Parkinson's disease.       Review of Systems:  Review of Systems  Unable to perform ROS: Other    Medications: Patient's Medications  New Prescriptions   No medications on file  Previous Medications   ACETAMINOPHEN (TYLENOL) 500 MG TABLET    Take 500 mg by mouth. Take twice daily   ASPIRIN 81 MG TABLET    Take 81 mg by mouth daily.   CARBIDOPA 25 MG TABLET    1 1/2 BY MOUTH AT BEDTIME   CARBIDOPA-LEVODOPA (SINEMET IR) 25-100 MG PER TABLET    Take 1 1/2 tablet 5 times a day, at 6 am, 10 am, 2 pm, 5:15 pm and bedtime.   CLONIDINE (CATAPRES) 0.1 MG TABLET    Take 0.1 mg by mouth. Take one only if SBP 170 or greater; every 6 hours as needed   DICLOFENAC SODIUM (VOLTAREN) 1 % GEL    Apply 2 g topically. Use 2 grams to each shoulder three times daily to reduce pain   HYDRALAZINE (APRESOLINE) 50 MG TABLET    50 mg. Take one three times daily hold for systolic <902   MAGNESIUM HYDROXIDE (MILK OF MAGNESIA) 400 MG/5ML SUSPENSION    Take 45 mLs by mouth daily as needed for mild constipation.   POLYETHYLENE GLYCOL (MIRALAX / GLYCOLAX) PACKET    Take 17 g by mouth daily as needed.   SENNOSIDES-DOCUSATE SODIUM (SENOKOT-S) 8.6-50 MG TABLET    Take 2 tablets by mouth  at bedtime.  Modified Medications   No medications on file  Discontinued Medications   No medications on file     Physical Exam:  Filed Vitals:   12/27/14 1503  BP: 128/72  Pulse: 69  Temp: 98.6 F (37 C)  Resp: 18  SpO2: 96%    Physical Exam  Constitutional: No distress.  Abdominal: Soft. Bowel sounds are normal. She exhibits no distension. There is no tenderness.  No s/p or CVA tenderness  Genitourinary: Uterus normal. Rectal exam shows no external hemorrhoid. Uterus is not tender. Vulva exhibits exudate. Vulva exhibits no erythema, no lesion, no rash and no tenderness. Vagina exhibits normal mucosa. Thick  odorless  yellow and vaginal discharge (purulent) found.  Unable to visualize cervical os due to positioning  Skin: Skin is warm and dry. She is not diaphoretic.  Psychiatric: Affect normal.    Wt Readings from Last 3 Encounters:  12/13/14 143 lb 11.2 oz (65.182 kg)  12/26/13 147 lb (66.679 kg)  12/01/13 150 lb 12.8 oz (68.402 kg)     Labs reviewed/Significant Diagnostic Results:  Basic Metabolic Panel: No results for input(s): NA, K, CL, CO2, GLUCOSE, BUN, CREATININE, CALCIUM, MG, PHOS in the last 8760 hours. Liver  Function Tests: No results for input(s): AST, ALT, ALKPHOS, BILITOT, PROT, ALBUMIN in the last 8760 hours. No results for input(s): LIPASE, AMYLASE in the last 8760 hours. No results for input(s): AMMONIA in the last 8760 hours. CBC: No results for input(s): WBC, NEUTROABS, HGB, HCT, MCV, PLT in the last 8760 hours. CBG: No results for input(s): GLUCAP in the last 8760 hours. TSH: No results for input(s): TSH in the last 8760 hours. A1C: No results found for: HGBA1C Lipid Panel: No results for input(s): CHOL, HDL, LDLCALC, TRIG, CHOLHDL, LDLDIRECT in the last 8760 hours.     Assessment/Plan  The resident was placed in lithotomy position to the fullest extent possible without causing discomfort.  She has decreased ROM to both hips so this  was difficult.  Her exam was performed in the bed and there fore I was not able to visualize the os well. There was a large amt of purulent drainage in the vaginal canal without erythema or tenderness.  Findings were discussed with her daughter, Vanessa Mcbride.   1. Vaginal discharge -purulent in nature, in this setting its most likely a UTI -there were no signs of yeast infection or odor -specimen sent for gram stain, wet prep -UA C and S via I and O cath -begin Cipro 500 mg BID with Florastor 1 cap BID for 7 days     [name], [name] [phone]

## 2015-01-15 ENCOUNTER — Encounter: Payer: Self-pay | Admitting: Internal Medicine

## 2015-01-16 ENCOUNTER — Encounter: Payer: Medicare Other | Admitting: Internal Medicine

## 2015-01-31 ENCOUNTER — Ambulatory Visit: Payer: Medicare Other | Admitting: Neurology

## 2015-02-05 ENCOUNTER — Encounter: Payer: Self-pay | Admitting: Neurology

## 2015-02-05 ENCOUNTER — Ambulatory Visit (INDEPENDENT_AMBULATORY_CARE_PROVIDER_SITE_OTHER): Payer: Medicare Other | Admitting: Neurology

## 2015-02-05 VITALS — BP 132/76 | HR 70 | Resp 14

## 2015-02-05 DIAGNOSIS — R54 Age-related physical debility: Secondary | ICD-10-CM | POA: Diagnosis not present

## 2015-02-05 DIAGNOSIS — R413 Other amnesia: Secondary | ICD-10-CM

## 2015-02-05 DIAGNOSIS — G20 Parkinson's disease: Secondary | ICD-10-CM

## 2015-02-05 DIAGNOSIS — R5381 Other malaise: Secondary | ICD-10-CM

## 2015-02-05 NOTE — Patient Instructions (Addendum)
We will continue with Sinemet 1-1/2 pills 5 times a day. It can be crushed and mixed with applesauce. It is okay to have some "wiggle room" with the dose times, generally, try to have a 4 hourly interval between doses and try not to withhold doses. Monitor BP and try to increase oral fluid intake. Mobilize with 3 person assist as possible and transfer with [facility] lift. Be proactive with constipation.  Follow up in 4 months.

## 2015-02-05 NOTE — Progress Notes (Signed)
Subjective:    Patient ID: Vanessa Mcbride is a 81 y.o. female.  HPI     Interim history:   Vanessa Mcbride is a very friendly 81 year old right-handed woman with an underlying medical history of hyperlipidemia, hypertension, osteoarthritis, insomnia, memory loss, anxiety and history of fall with fracture of her left arm in 2014, who presents for followup consultation of her right-sided predominant Parkinson's disease. She is accompanied by one of her personal caretakers from [place], Vanessa Mcbride, and her Daughter's assistant, Vanessa Mcbride, today. I last saw her on 10/31/2014, at which time she was minimally verbal. She was essentially nonverbal. She was in skilled nursing rehabilitation because of increased level of care needed. She had recently seen her PCP. She was overall deemed stable. Nevertheless, she had taken a gradual and steady decline including deconditioning and trouble swallowing. They had to crush her Sinemet. She was able to take it with some applesauce. She had more lower extremity edema. I suggested we continue with Sinemet IR 25/100 mg, 1-1/2 pills 5 times a day.  Today, 02/05/2015: She is essentially non-verbal, no new issues are reported by Vanessa Mcbride or [name]. They are now using a Hoyer lift for all transfers. No issues with constipation reported. She still walks 3 times a day with 3 person assist.   Previously:   I saw her on 07/20/2014, at which time her daughter reported that things were relatively stable. The patient was tolerating her 5 doses of Sinemet daily, 1-1/2 pills each dose. She continued to have 2 person assist when standing and was no longer able to walk on her own. She had no recent falls. Overall there had been gradual decline in her condition.   In the interim, her daughter called for clarification of the timing of her Sinemet. I suggested we give her more flexibility with her dosing because of timing of other medications, her sleep and wake schedule, and her meal schedule.  I saw  her on 04/23/2014, at which time her daughter reported that her weight had stabilized after she had lost about 10 pounds. She had had some increased blood pressure values in her hydralazine was increased to twice daily. She was needing a 2 person assist with walking. She had not fallen. She was becoming less and less verbal however. She was no longer on Sinemet CR at night and was on sinemet immediate release 25-100 milligrams strength 1-1/2 pills 4 times a day. I suggested we restart Sinemet CR at bedtime.  In the interim, her daughter called on 07/05/2014 reporting that the patient was having difficulty swallowing her medications. I suggested we start crushing her Sinemet and giving it with applesauce but this could not be done with a CR. Therefore, I suggested we increase her immediate release Sinemet to 1-1/2 pills 5 times a day and do away with a CR at night.    I saw her on 11/07/2013, at which time her daughter worried about her weight gain. She was having difficulty maintaining sleep. Melatonin and amlodipine had been stopped. Her primary care physician prescribed Belsomra, but her daughter was not comfortable with her taking a sleeping pill and it was not started. She was having more exhaustion and fatigue. She had more trouble walking and was getting slower. She had to get up to use the bathroom 2 or 3 times per night. Her daughter resigned from her job to be closer to her mother and be with her mother more often.   I suggested we increase her Sinemet to 1-1/2  pills 4 times a day and we also added a Sinemet CR at bedtime. Unfortunately, in the interim, the patient fell on 11/20/2013. She was found to have a fractured vertebra. She was also found to be in new onset A. fib with rapid RVR. Unfortunately, this hospital stay left her more debilitated and frail. She needed 2 person assist.  In the interim, she was seen by our nurse practitioner, Ms. Lam, on 01/08/2014, at which time I also saw the  patient. We kept her medications the same.   I saw her on 04/25/2013, at which time I felt that her symptoms were worse with respect to her walking and her tremors. I suggested a slight increase in her Sinemet to alternating 1-1/2 pills with one pill for a total of 4 doses and a total of 5 pills. Her caretakers reported that she snored and had had some apneic pauses in her breathing. I suggested a sleep study but they suggested that he check with the patient's daughter and call back with regards to doing a sleep study. In the interim, she was seen by our nurse practitioner, Ms. Lam on 07/20/2013 at which time she was fairly stable and the medications were kept the same.   I saw her on 12/23/2012, and which time I increased her Sinemet to 4 times daily from tid, as I felt that she had worsened since her previous visit. She reported worse memory, poor sleep, and occasional muscle jerks. I continued her on 1-1/2 pills for the first dose and one pill each time for the last 3 doses.   I saw her on 09/21/2012, at which time I increased her morning dose of Sinemet to 1-1/2 pills and otherwise she was to continue her midday dose at one pill and her afternoon dose at one pill. Prior to that I saw her on 06/09/2012. I did not make any medication changes in 3/14. Her primary care physician encouraged her to use her walker or cane but she does not like to use them. She holds onto her caretaker.     Her Past Medical History Is Significant For: Past Medical History  Diagnosis Date  . Parkinson's disease ([place])   . Hypertension   . Arthritis     osteoarthritis  . Scoliosis   . Memory loss 06/09/2012  . Aortic valve disorders 04/11/2012  . Dementia in conditions classified elsewhere without behavioral disturbance 04/07/2012  . Hyperlipidemia   . Osteoarthrosis, unspecified whether generalized or localized, unspecified site   . Insomnia, unspecified 03/20/2012  . Fracture closed, humerus 03/20/2012  . Other  closed fractures of distal end of radius (alone) 03/20/2012  . 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 Unspecified constipation   . Debility, unspecified 03/18/2012  . Anxiety   . Generalized anxiety disorder   . Urinary incontinence 07/05/2013    Re: PD  . Atrial fibrillation [facility])     Her Past Surgical History Is Significant For: Past Surgical History  Procedure Laterality Date  . Breast surgery      benign lumpectomy  . Cholecystectomy      Her Family History Is Significant For: Family History  Problem Relation Age of Onset  . Heart disease Son     CAD  . Diabetes Mother   . Diabetes Sister   . Hypertension Sister     Her Social History Is Significant For: Social History   Social History  . Marital Status: Widowed    Spouse  Name: N/A  . Number of Children: 2  . Years of Education: college   Occupational History  . retired Armed forces operational officer    Social History Main Topics  . Smoking status: Never Smoker   . Smokeless tobacco: Never Used  . Alcohol Use: No  . Drug Use: No  . Sexual Activity: No   Other Topics Concern  . None   Social History Narrative   Widowed 02/2012 (after 61 yr marriage). [place], understands English fairly well, speaks English poorly. Occupation: Retired Armed forces operational officer. Moved form [place] to [place] 03/2012,     Lives at [facility],  [place] section. Has 24 hour caregivers.   Advanced Directives:  DNR, MOST (05/2012)        Her Allergies Are:  Allergies  Allergen Reactions  . Iodine Anaphylaxis  . Iohexol      Desc: pt has had contrast in the past (years ago) and it causea anaphylaxis- per pt's daughter.  stephanie davis,rtrct, Onset Date: [id number]   :   Her Current Medications Are:  Outpatient Encounter Prescriptions as of 02/05/2015  Medication Sig  . acetaminophen (TYLENOL) 500 MG tablet Take 500 mg by mouth. Take twice daily  . aspirin 81 MG tablet Take  81 mg by mouth daily.  . Carbidopa 25 MG tablet 1 1/2 BY MOUTH AT BEDTIME  . carbidopa-levodopa (SINEMET IR) 25-100 MG per tablet Take 1 1/2 tablet 5 times a day, at 6 am, 10 am, 2 pm, 5:15 pm and bedtime.  . cloNIDine (CATAPRES) 0.1 MG tablet Take 0.1 mg by mouth. Take one only if SBP 170 or greater; every 6 hours as needed  . hydrALAZINE (APRESOLINE) 50 MG tablet 50 mg. Take one three times daily hold for systolic 99991111  . magnesium hydroxide (MILK OF MAGNESIA) 400 MG/5ML suspension Take 45 mLs by mouth daily as needed for mild constipation.  . polyethylene glycol (MIRALAX / GLYCOLAX) packet Take 17 g by mouth daily as needed.  . sennosides-docusate sodium (SENOKOT-S) 8.6-50 MG tablet Take 2 tablets by mouth at bedtime.  . [DISCONTINUED] diclofenac sodium (VOLTAREN) 1 % GEL Apply 2 g topically. Use 2 grams to each shoulder three times daily to reduce pain   No facility-administered encounter medications on file as of 02/05/2015.  :  Review of Systems:  Out of a complete 14 point review of systems, all are reviewed and negative with the exception of these symptoms as listed below:   Review of Systems  Neurological:       No new concerns per care taker.     Objective:  Neurologic Exam  Physical Exam Physical Examination:   Filed Vitals:   02/05/15 1137  BP: 132/76  Mcbride: 70  Resp: 14    General Examination: The patient is a very pleasant 81 y.o. female in no acute distress. She is essentially nonverbal. She is situated in her wheelchair. She has her eyes closed at times, she seems to have apraxia of eyelid opening, but she did nod off a couple of times and had to be woken up with verbal and tactile stimuli.   HEENT exam: Normocephalic, atraumatic, moderate facial masking is noted and she keeps her mouth open more. She has no lip, neck or jaw tremor. Neck tone is moderately to severely elevated. She has decrease passive ROM in her neck. Hearing seems intact. Speech: She is  essentially nonverbal with the exception of saying goodbye at the end. Oropharynx exam reveals: mild mouth dryness, no drooling. Pupils  are reactive to light and extraocular tracking is minimal at this time, she does not follow commands of the track the light.  Chest is clear to auscultation with no wheezing or rhonchi or crackles.  Heart sounds are normal without murmurs, rubs or gallops. Heart sounds are regular. Abdomen is soft, nontender. Bowel sounds are appreciated. Extremities: She has b/l, L>R trace edema around the ankles.   Neurologically: Mental status: The patient is awake, not fully alert and oriented to self only. She only says something once at the very end of the visit. She has evidence of significant bradyphrenia with quite a long delay in her responses or visual tracking. Cranial nerves are as described under HEENT exam. Motor exam: She a global strength of 3-4/5. She has minimal spontaneous movements. She's not able to mimic movements or follow verbal commands. Tone is overall increased. Bradykinesia seems severe. She does have cogwheeling in both upper extremities. She has no resting tremor. Fine motor skills are not really possible to be assessed at this time. She not able to stand or walk. Sensory exam appears to be intact to light touch throughout and reflexes are about 1+ throughout. She is not able to do fine motor skills testing.   Assessment and plan:    In summary, Vanessa Mcbride is a very pleasant 81 year old lady with an underlying medical history of hyperlipidemia, hypertension, osteoarthritis, insomnia, memory loss, anxiety and history of fall with fracture of her left arm in 2014 and more recent Dx of of a fib with RVR, who presents for follow-up consultation of her advanced, right-sided predominant Parkinson's disease, complicated by constipation and even obstipation, advancing age, swallowing difficulty, scant speech, swelling of legs, memory loss, falls with injuries, as well as  overall frailty deconditioning. She has had progression of her symptoms over the last few visits. Her exam appears fairly stable, but is limited by the lack of cooperation. I talked to her caregivers, Vanessa Mcbride and Paramedic, [place] today. I suggested we continue with Sinemet 1-1/2 pills 5 times a day. They will continue to try to mobilize her with maximum assist, typically with 3 caretakers. Since September or October of this year she has to transfer with a Hoyer lift only. I talked to them about trying to push oral fluid intake and be proactive about constipation issues. Currently she has no issues with constipation. She had a recent urinary tract infection which was treated with Cipro.  I would like see her back in about 4 months, sooner if needed. I answered all their questions today. I filled out her paperwork from her SNF and provided them with written instructions as well. I spent 20 min in total face-to-face time with the patient, more 50% of which was spent in counseling and coordination of care, reviewing test results, reviewing medication and reviewing the diagnosis of advanced PD, its prognosis and treatment options.

## 2015-02-18 ENCOUNTER — Non-Acute Institutional Stay (SKILLED_NURSING_FACILITY): Payer: Medicare Other | Admitting: Adult Health

## 2015-02-18 DIAGNOSIS — N898 Other specified noninflammatory disorders of vagina: Secondary | ICD-10-CM

## 2015-02-18 DIAGNOSIS — I10 Essential (primary) hypertension: Secondary | ICD-10-CM

## 2015-02-18 DIAGNOSIS — K5901 Slow transit constipation: Secondary | ICD-10-CM

## 2015-02-18 DIAGNOSIS — R8299 Other abnormal findings in urine: Secondary | ICD-10-CM | POA: Diagnosis not present

## 2015-02-21 NOTE — Progress Notes (Signed)
Patient ID: Vanessa Mcbride, female   DOB: [birth year], 81 y.o.   MRN: [id number]     Nursing Home Location:  [place]   Code Status:   Patient Care Team: [name], DO as PCP - General (Geriatric Medicine) [facility] Star Age, MD as Attending Physician (Neurology) [name], NP as Nurse Practitioner (Neurology)   Place of Service: SNF (31)  Chief Complaint  Patient presents with  . Acute Visit    decreased appetite, vaginal drainage  . Medical Management of Chronic Issues    HPI:  81 y.o. female residing at [facility], skilled care section. I am here to review her chronic medical issues, as well as address an acute issues. The resident has a hx of PD with 24 hr care givers. She recently moved from [place] to skilled care due to functional decline. She spends most of the day in her chair but can ambulate with max assist short distances.  She was seen last month on 10/6 and treated for a UTI with cipro. The staff is reporting that she has vaginal discharge again and has not been eating well and sleeping more. She is non verbal and can not elaborate further.  She has not had a fever and does not appear to be in pain. Her VS have been WNL.  She had a few loose stools over the weekend that filled her diaper but the most recent stool was formed.  She is on senna for constipation and this was held on Friday and Saturday but given on Sunday.     Review of Systems:  Review of Systems  Unable to perform ROS: Dementia    Medications: Patient's Medications  New Prescriptions   No medications on file  Previous Medications   ACETAMINOPHEN (TYLENOL) 500 MG TABLET    Take 500 mg by mouth. Take twice daily   ASPIRIN 81 MG TABLET    Take 81 mg by mouth daily.   CARBIDOPA 25 MG TABLET    1 1/2 BY MOUTH AT BEDTIME   CARBIDOPA-LEVODOPA (SINEMET IR) 25-100 MG PER TABLET    Take 1 1/2 tablet 5 times a day, at 6 am, 10 am, 2 pm,  5:15 pm and bedtime.   CLONIDINE (CATAPRES) 0.1 MG TABLET    Take 0.1 mg by mouth. Take one only if SBP 170 or greater; every 6 hours as needed   HYDRALAZINE (APRESOLINE) 50 MG TABLET    50 mg. Take one three times daily hold for systolic 99991111   MAGNESIUM HYDROXIDE (MILK OF MAGNESIA) 400 MG/5ML SUSPENSION    Take 45 mLs by mouth daily as needed for mild constipation.   POLYETHYLENE GLYCOL (MIRALAX / GLYCOLAX) PACKET    Take 17 g by mouth daily as needed.   SENNOSIDES-DOCUSATE SODIUM (SENOKOT-S) 8.6-50 MG TABLET    Take 2 tablets by mouth at bedtime.  Modified Medications   No medications on file  Discontinued Medications   No medications on file     Physical Exam:  Filed Vitals:   02/18/15 1603  BP: 140/78  Pulse: 64  Temp: 98.6 F (37 C)  Resp: 16  SpO2: 97%    Physical Exam  Constitutional: No distress.  HENT:  Head: Normocephalic and atraumatic.  Mouth/Throat: Oropharynx is clear and moist. No oropharyngeal exudate.  Neck: No JVD present.  Cardiovascular: Normal rate and regular rhythm.   No murmur heard. No edema  Abdominal: Soft. Bowel sounds are normal. She  exhibits no distension. There is no tenderness.  No s/p or CVA tenderness  Neurological: She is alert.  Not able to f/c, tracks with eyes in the room  Skin: Skin is warm and dry. She is not diaphoretic.  Moist mucus membranes  Psychiatric: Affect normal.    Wt Readings from Last 3 Encounters:  12/13/14 143 lb 11.2 oz (65.182 kg)  12/26/13 147 lb (66.679 kg)  12/01/13 150 lb 12.8 oz (68.402 kg)     Labs reviewed/Significant Diagnostic Results:  Basic Metabolic Panel: No results for input(s): NA, K, CL, CO2, GLUCOSE, BUN, CREATININE, CALCIUM, MG, PHOS in the last 8760 hours. Liver Function Tests: No results for input(s): AST, ALT, ALKPHOS, BILITOT, PROT, ALBUMIN in the last 8760 hours. No results for input(s): LIPASE, AMYLASE in the last 8760 hours. No results for input(s): AMMONIA in the last 8760  hours. CBC: No results for input(s): WBC, NEUTROABS, HGB, HCT, MCV, PLT in the last 8760 hours. CBG: No results for input(s): GLUCAP in the last 8760 hours. TSH: No results for input(s): TSH in the last 8760 hours. A1C: No results found for: HGBA1C Lipid Panel: No results for input(s): CHOL, HDL, LDLCALC, TRIG, CHOLHDL, LDLDIRECT in the last 8760 hours.     Assessment/Plan  1) Vaginal discharge -previous pelvic exam was difficult so it was deferred today. My last survey revealed that the discharge was coming from the urine so that is most likely the culprit again today.  -check UA C and S and monitor VS intake and output  2) HTN -BP controlled, continue current meds -most recent BMP wnl  3) Constipation -had loose stools over the weekend which resolved -please hold senna for diarrhea   Labs/tests ordered UA C and S I spoke with her daughter [name] and she declined labs at this time. The resident was able to eat lunch and her daughter is going to make sure that they incorporate yogurt and OJ into her diet.    [name], ANP [facility] [phone]

## 2015-03-08 DIAGNOSIS — L814 Other melanin hyperpigmentation: Secondary | ICD-10-CM | POA: Diagnosis not present

## 2015-03-08 DIAGNOSIS — L578 Other skin changes due to chronic exposure to nonionizing radiation: Secondary | ICD-10-CM | POA: Diagnosis not present

## 2015-03-12 ENCOUNTER — Non-Acute Institutional Stay (SKILLED_NURSING_FACILITY): Payer: Medicare Other | Admitting: Internal Medicine

## 2015-03-12 DIAGNOSIS — N898 Other specified noninflammatory disorders of vagina: Secondary | ICD-10-CM

## 2015-03-12 DIAGNOSIS — G20 Parkinson's disease: Secondary | ICD-10-CM | POA: Diagnosis not present

## 2015-03-12 DIAGNOSIS — I10 Essential (primary) hypertension: Secondary | ICD-10-CM

## 2015-03-12 DIAGNOSIS — M1990 Unspecified osteoarthritis, unspecified site: Secondary | ICD-10-CM | POA: Diagnosis not present

## 2015-03-12 DIAGNOSIS — K5901 Slow transit constipation: Secondary | ICD-10-CM

## 2015-03-12 DIAGNOSIS — G20A1 Parkinson's disease without dyskinesia, without mention of fluctuations: Secondary | ICD-10-CM

## 2015-03-12 NOTE — Progress Notes (Signed)
Patient ID: Vanessa Mcbride, female   DOB: March 10, 1925, 81 y.o.   MRN: [id number]   Location: [facility] Provider: Dariyah Garduno L. Mariea Clonts, D.O., C.M.D.  Code Status: DNR Goals of Care: Advanced Directive information Advanced Directives 10/24/2014  Does patient have an advance directive? Yes  Type of Paramedic of [place];Living will;Out of facility DNR (pink MOST or yellow form)  Does patient want to make changes to advanced directive? No - Patient declined  Copy of advanced directive(s) in chart? No - copy requested  Pre-existing out of facility DNR order (yellow form or pink MOST form) Yellow form placed in chart (order not valid for inpatient use)     Chief Complaint  Patient presents with  . Acute Visit    thick malodorous vaginal discharge 12/16; just completed cipro for UTI one week prior, has been afebrile    HPI: Patient is a 81 y.o. female with advanced Parkinson's disease seen in her room today for an acute visit for thick malodorous vaginal discharge initially noted 12/16.  Has been seen off and on the past few days by report from caregiver.  Pt completed her cipro therapy for UTI one week ago.  She has been afebrile and without any changes to her normal mental state.  Review of Systems: obtained from caregiver, nursing  Review of Systems  Constitutional: Negative for fever and chills.  Respiratory: Negative for shortness of breath.   Cardiovascular: Negative for chest pain.  Gastrointestinal: Positive for constipation. Negative for abdominal pain.  Genitourinary: Negative for dysuria.       Urinary incontinence  Musculoskeletal: Negative for falls.  Skin: Negative for itching and rash.  Neurological: Positive for tremors.  Psychiatric/Behavioral: Positive for memory loss.    Past Medical History  Diagnosis Date  . Parkinson's disease ([place])   . Hypertension   . Arthritis     osteoarthritis  . Scoliosis   . Memory loss 06/09/2012  . Aortic valve  disorders 04/11/2012  . Dementia in conditions classified elsewhere without behavioral disturbance 04/07/2012  . Hyperlipidemia   . Osteoarthrosis, unspecified whether generalized or localized, unspecified site   . Insomnia, unspecified 03/20/2012  . Fracture closed, humerus 03/20/2012  . Other closed fractures of distal end of radius (alone) 03/20/2012  . 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 Unspecified constipation   . Debility, unspecified 03/18/2012  . Anxiety   . Generalized anxiety disorder   . Urinary incontinence 07/05/2013    Re: PD  . Atrial fibrillation [facility])     Past Surgical History  Procedure Laterality Date  . Breast surgery      benign lumpectomy  . Cholecystectomy      Allergies  Allergen Reactions  . Iodine Anaphylaxis  . Iohexol      Desc: pt has had contrast in the past (years ago) and it causea anaphylaxis- per pt's daughter.  stephanie davis,rtrct, Onset Date: [id number]       Medication List       This list is accurate as of: 03/12/15 11:59 PM.  Always use your most recent med list.               acetaminophen 500 MG tablet  Commonly known as:  TYLENOL  Take 500 mg by mouth. Take twice daily     aspirin 81 MG tablet  Take 81 mg by mouth daily.     Carbidopa 25 MG tablet  1 1/2 BY MOUTH AT BEDTIME  carbidopa-levodopa 25-100 MG tablet  Commonly known as:  SINEMET IR  Take 1 1/2 tablet 5 times a day, at 6 am, 10 am, 2 pm, 5:15 pm and bedtime.     cloNIDine 0.1 MG tablet  Commonly known as:  CATAPRES  Take 0.1 mg by mouth. Take one only if SBP 170 or greater; every 6 hours as needed     hydrALAZINE 50 MG tablet  Commonly known as:  APRESOLINE  50 mg. Take one three times daily hold for systolic 99991111     magnesium hydroxide 400 MG/5ML suspension  Commonly known as:  MILK OF MAGNESIA  Take 45 mLs by mouth daily as needed for mild constipation.     polyethylene glycol packet  Commonly  known as:  MIRALAX / GLYCOLAX  Take 17 g by mouth daily as needed.     sennosides-docusate sodium 8.6-50 MG tablet  Commonly known as:  SENOKOT-S  Take 2 tablets by mouth at bedtime.        Physical Exam: Filed Vitals:   03/12/15 1413  BP: 142/70  Pulse: 77  Temp: 98.6 F (37 C)  Resp: 18  Height: 4\' 11"  (1.499 m)  Weight: 144 lb 12.8 oz (65.681 kg)  SpO2: 98%   Body mass index is 29.23 kg/(m^2). Physical Exam  Constitutional: No distress.  Cardiovascular: Normal rate and regular rhythm.   Murmur heard. Pulmonary/Chest: Effort normal and breath sounds normal.  Genitourinary: No vaginal discharge found.  No current discharge  Neurological: She is alert. She exhibits abnormal muscle tone.  Flat affect  Skin: Skin is warm and dry. No erythema.    Assessment/Plan 1. Vaginal discharge -has had some of this off and on -suspect this time that it may have been yeast after completing abx -if recurs and remains thick and white, treat with diflucan -exam challenging -avoid overtesting for UTIs due to possible colonization--clear order with me first  2. Parkinson's disease ([place]) -cont 24 hr care, requires full adl assist -cont sinemet, carbidopa  3. Osteoarthritis, unspecified osteoarthritis type, unspecified site -cont tylenol for pain  4. Slow transit constipation -cont senna s 2 daily and daily prn miralax and MOM  5.  HTN:  bp at goal with hydralazine tid and prn clonidine over sbp of 170 only q 6 hrs  Labs/tests ordered:  No new today  Willey Due L. Edu On, D.O. [employer] [facility] [address] [place], [place] [address] Cell Phone (Mon-Fri 8am-5pm):  [phone] On Call:  [phone] & follow prompts after 5pm & weekends Office Phone:  [phone] Office Fax:  [phone]

## 2015-03-22 DIAGNOSIS — R369 Urethral discharge, unspecified: Secondary | ICD-10-CM | POA: Diagnosis not present

## 2015-04-06 ENCOUNTER — Encounter: Payer: Self-pay | Admitting: Internal Medicine

## 2015-04-06 DIAGNOSIS — G20A1 Parkinson's disease without dyskinesia, without mention of fluctuations: Secondary | ICD-10-CM | POA: Insufficient documentation

## 2015-04-06 DIAGNOSIS — G20 Parkinson's disease: Secondary | ICD-10-CM | POA: Insufficient documentation

## 2015-04-09 ENCOUNTER — Other Ambulatory Visit: Payer: Self-pay

## 2015-04-11 ENCOUNTER — Non-Acute Institutional Stay (SKILLED_NURSING_FACILITY): Payer: Medicare Other | Admitting: Adult Health

## 2015-04-11 DIAGNOSIS — F0280 Dementia in other diseases classified elsewhere without behavioral disturbance: Secondary | ICD-10-CM

## 2015-04-11 DIAGNOSIS — G20 Parkinson's disease: Secondary | ICD-10-CM

## 2015-04-11 DIAGNOSIS — K5901 Slow transit constipation: Secondary | ICD-10-CM

## 2015-04-11 DIAGNOSIS — I10 Essential (primary) hypertension: Secondary | ICD-10-CM

## 2015-04-11 DIAGNOSIS — R269 Unspecified abnormalities of gait and mobility: Secondary | ICD-10-CM

## 2015-04-19 ENCOUNTER — Encounter: Payer: Self-pay | Admitting: Adult Health

## 2015-04-19 NOTE — Progress Notes (Signed)
Patient ID: Vanessa Mcbride, female   DOB: May 18, 1924, 82 y.o.   MRN: [id number]     Nursing Home Location:  [place]   Code Status: DNR, most form  Patient Care Team: [name], DO as PCP - General (Geriatric Medicine) [facility] Star Age, MD as Attending Physician (Neurology) [name], NP as Nurse Practitioner (Neurology)   Place of Service: SNF (31)  Chief Complaint  Patient presents with  . Medical Management of Chronic Issues    HPI:  82 y.o.  female residing at [facility], skilled care section. I am here to review her chronic medical issues. She has a hx of afib, htn, constipation, PD, dementia, and OA.   HTN: BP controlled, on apresoline, no prn clonidine use, occasional edema to the left ankle  Constipation: no reports of constipation per staff, using miralax and senokot s  Dementia: non verbal, not able to follow commands, tracks with her eyes in the room, not on meds due to the advanced nature  PD: currently on sinemet, followed by neurology, increasing weakness and rigidity, especially on the right  Gait abnormality: walks with max two person assist, fell earlier in the week and inverted her left ankle then developed swelling and refused to walk Staff reports increased weakness and buckling of knees with ambulation    Review of Systems:  Review of Systems  Unable to perform ROS: Dementia    Medications: Patient's Medications  New Prescriptions   No medications on file  Previous Medications   ACETAMINOPHEN (TYLENOL) 500 MG TABLET    Take 500 mg by mouth. Take twice daily   ASPIRIN 81 MG TABLET    Take 81 mg by mouth daily.   CARBIDOPA 25 MG TABLET    1 1/2 BY MOUTH AT BEDTIME   CARBIDOPA-LEVODOPA (SINEMET IR) 25-100 MG PER TABLET    Take 1 1/2 tablet 5 times a day, at 6 am, 10 am, 2 pm, 5:15 pm and bedtime.   CLONIDINE (CATAPRES) 0.1 MG TABLET    Take 0.1 mg by mouth. Take one only if  SBP 170 or greater; every 6 hours as needed   HYDRALAZINE (APRESOLINE) 50 MG TABLET    50 mg. Take one three times daily hold for systolic 99991111   MAGNESIUM HYDROXIDE (MILK OF MAGNESIA) 400 MG/5ML SUSPENSION    Take 45 mLs by mouth daily as needed for mild constipation.   POLYETHYLENE GLYCOL (MIRALAX / GLYCOLAX) PACKET    Take 17 g by mouth daily as needed.   SENNOSIDES-DOCUSATE SODIUM (SENOKOT-S) 8.6-50 MG TABLET    Take 2 tablets by mouth at bedtime.  Modified Medications   No medications on file  Discontinued Medications   No medications on file     Physical Exam:  Filed Vitals:   04/19/15 1544  Weight: 144 lb (65.318 kg)    Physical Exam  Constitutional: No distress.  HENT:  Head: Normocephalic and atraumatic.  Nose: Nose normal.  Refused oral exam  Neck: No JVD present. No thyromegaly present.  Cardiovascular: Normal rate and regular rhythm.   No murmur heard. Pulmonary/Chest: Effort normal and breath sounds normal. No respiratory distress.  Abdominal: Soft. Bowel sounds are normal. She exhibits no distension.  Musculoskeletal: She exhibits edema (trace to the left ankle, no joint laxity). She exhibits no tenderness.  Rigidity noted to arms and legs, worse on the right  Neurological: She is alert.  No verbal and does not f/c  Skin:  Skin is warm and dry. She is not diaphoretic.    Wt Readings from Last 3 Encounters:  04/19/15 144 lb (65.318 kg)  03/12/15 144 lb 12.8 oz (65.681 kg)  12/13/14 143 lb 11.2 oz (65.182 kg)     Labs reviewed/Significant Diagnostic Results:  Basic Metabolic Panel:  Recent Labs  12/18/14  NA 136*  K 4.2  BUN 14  CREATININE 0.4*   Liver Function Tests: No results for input(s): AST, ALT, ALKPHOS, BILITOT, PROT, ALBUMIN in the last 8760 hours. No results for input(s): LIPASE, AMYLASE in the last 8760 hours. No results for input(s): AMMONIA in the last 8760 hours. CBC:  Recent Labs  12/18/14  WBC 4.5  HGB 13.0  HCT 38  PLT 247     CBG: No results for input(s): GLUCAP in the last 8760 hours. TSH:  Recent Labs  12/18/14  TSH 2.21   A1C: No results found for: HGBA1C Lipid Panel: No results for input(s): CHOL, HDL, LDLCALC, TRIG, CHOLHDL, LDLDIRECT in the last 8760 hours.     Assessment/Plan  1. Parkinson disease ([place]) -progressively weaker and more rigidity -continue sinemet per neuro  2. Dementia due to medical condition without behavioral disturbance -advanced, not able to perform cognitive testing  3. Slow transit constipation -controlled  4. Essential hypertension -controlled  5. Abnormality of gait PT to consult regarding safe practices when walking to prevent injury   [name], [place] [phone]

## 2015-04-24 DIAGNOSIS — Z9181 History of falling: Secondary | ICD-10-CM | POA: Diagnosis not present

## 2015-04-24 DIAGNOSIS — F0280 Dementia in other diseases classified elsewhere without behavioral disturbance: Secondary | ICD-10-CM | POA: Diagnosis not present

## 2015-04-24 DIAGNOSIS — G20 Parkinson's disease: Secondary | ICD-10-CM | POA: Diagnosis not present

## 2015-04-24 DIAGNOSIS — R2689 Other abnormalities of gait and mobility: Secondary | ICD-10-CM | POA: Diagnosis not present

## 2015-04-25 DIAGNOSIS — F0280 Dementia in other diseases classified elsewhere without behavioral disturbance: Secondary | ICD-10-CM | POA: Diagnosis not present

## 2015-04-25 DIAGNOSIS — G20 Parkinson's disease: Secondary | ICD-10-CM | POA: Diagnosis not present

## 2015-04-25 DIAGNOSIS — Z9181 History of falling: Secondary | ICD-10-CM | POA: Diagnosis not present

## 2015-04-25 DIAGNOSIS — R2689 Other abnormalities of gait and mobility: Secondary | ICD-10-CM | POA: Diagnosis not present

## 2015-04-26 DIAGNOSIS — R2689 Other abnormalities of gait and mobility: Secondary | ICD-10-CM | POA: Diagnosis not present

## 2015-04-26 DIAGNOSIS — Z9181 History of falling: Secondary | ICD-10-CM | POA: Diagnosis not present

## 2015-04-26 DIAGNOSIS — F0280 Dementia in other diseases classified elsewhere without behavioral disturbance: Secondary | ICD-10-CM | POA: Diagnosis not present

## 2015-04-26 DIAGNOSIS — G20 Parkinson's disease: Secondary | ICD-10-CM | POA: Diagnosis not present

## 2015-04-29 DIAGNOSIS — R2689 Other abnormalities of gait and mobility: Secondary | ICD-10-CM | POA: Diagnosis not present

## 2015-04-29 DIAGNOSIS — F0280 Dementia in other diseases classified elsewhere without behavioral disturbance: Secondary | ICD-10-CM | POA: Diagnosis not present

## 2015-04-29 DIAGNOSIS — Z9181 History of falling: Secondary | ICD-10-CM | POA: Diagnosis not present

## 2015-04-29 DIAGNOSIS — G20 Parkinson's disease: Secondary | ICD-10-CM | POA: Diagnosis not present

## 2015-05-01 DIAGNOSIS — Z9181 History of falling: Secondary | ICD-10-CM | POA: Diagnosis not present

## 2015-05-01 DIAGNOSIS — R2689 Other abnormalities of gait and mobility: Secondary | ICD-10-CM | POA: Diagnosis not present

## 2015-05-01 DIAGNOSIS — G20 Parkinson's disease: Secondary | ICD-10-CM | POA: Diagnosis not present

## 2015-05-01 DIAGNOSIS — F0280 Dementia in other diseases classified elsewhere without behavioral disturbance: Secondary | ICD-10-CM | POA: Diagnosis not present

## 2015-05-02 DIAGNOSIS — Z9181 History of falling: Secondary | ICD-10-CM | POA: Diagnosis not present

## 2015-05-02 DIAGNOSIS — F0280 Dementia in other diseases classified elsewhere without behavioral disturbance: Secondary | ICD-10-CM | POA: Diagnosis not present

## 2015-05-02 DIAGNOSIS — R2689 Other abnormalities of gait and mobility: Secondary | ICD-10-CM | POA: Diagnosis not present

## 2015-05-02 DIAGNOSIS — G20 Parkinson's disease: Secondary | ICD-10-CM | POA: Diagnosis not present

## 2015-05-03 DIAGNOSIS — F0280 Dementia in other diseases classified elsewhere without behavioral disturbance: Secondary | ICD-10-CM | POA: Diagnosis not present

## 2015-05-03 DIAGNOSIS — Z9181 History of falling: Secondary | ICD-10-CM | POA: Diagnosis not present

## 2015-05-03 DIAGNOSIS — R2689 Other abnormalities of gait and mobility: Secondary | ICD-10-CM | POA: Diagnosis not present

## 2015-05-03 DIAGNOSIS — G20 Parkinson's disease: Secondary | ICD-10-CM | POA: Diagnosis not present

## 2015-05-08 DIAGNOSIS — Z9181 History of falling: Secondary | ICD-10-CM | POA: Diagnosis not present

## 2015-05-08 DIAGNOSIS — G20 Parkinson's disease: Secondary | ICD-10-CM | POA: Diagnosis not present

## 2015-05-08 DIAGNOSIS — F0280 Dementia in other diseases classified elsewhere without behavioral disturbance: Secondary | ICD-10-CM | POA: Diagnosis not present

## 2015-05-08 DIAGNOSIS — R2689 Other abnormalities of gait and mobility: Secondary | ICD-10-CM | POA: Diagnosis not present

## 2015-05-14 ENCOUNTER — Non-Acute Institutional Stay (SKILLED_NURSING_FACILITY): Payer: Medicare Other | Admitting: Internal Medicine

## 2015-05-14 ENCOUNTER — Encounter: Payer: Self-pay | Admitting: Internal Medicine

## 2015-05-14 DIAGNOSIS — M810 Age-related osteoporosis without current pathological fracture: Secondary | ICD-10-CM | POA: Diagnosis not present

## 2015-05-14 DIAGNOSIS — N39498 Other specified urinary incontinence: Secondary | ICD-10-CM | POA: Diagnosis not present

## 2015-05-14 DIAGNOSIS — E785 Hyperlipidemia, unspecified: Secondary | ICD-10-CM

## 2015-05-14 DIAGNOSIS — F0280 Dementia in other diseases classified elsewhere without behavioral disturbance: Secondary | ICD-10-CM | POA: Diagnosis not present

## 2015-05-14 DIAGNOSIS — I10 Essential (primary) hypertension: Secondary | ICD-10-CM | POA: Diagnosis not present

## 2015-05-14 DIAGNOSIS — K5901 Slow transit constipation: Secondary | ICD-10-CM

## 2015-05-14 DIAGNOSIS — G20 Parkinson's disease: Secondary | ICD-10-CM | POA: Diagnosis not present

## 2015-05-14 DIAGNOSIS — G20A1 Parkinson's disease without dyskinesia, without mention of fluctuations: Secondary | ICD-10-CM

## 2015-05-14 NOTE — Progress Notes (Signed)
Patient ID: Vanessa Mcbride, female   DOB: 1924-12-17, 82 y.o.   MRN: [id number]  Location:  [place] Room Number: [id number] Place of Service:  SNF (31) Provider:  Nader Boys L. Mariea Clonts, D.O., C.M.D.  Hollace Kinnier, DO  Patient Care Team: [name], DO as PCP - General (Geriatric Medicine) [facility] Star Age, MD as Attending Physician (Neurology) [name], NP as Nurse Practitioner (Neurology)  Extended Emergency Contact Information Primary Emergency Contact: [facility] Address: [address]          [place], [place] [address] [name] of [place] Phone: [phone] Mobile Phone: [phone] Relation: Daughter  Code Status:  DNR Goals of care: Advanced Directive information Advanced Directives 05/14/2015  Does patient have an advance directive? Yes  Type of Paramedic of [place];Living will;Out of facility DNR (pink MOST or yellow form)  Does patient want to make changes to advanced directive? -  Copy of advanced directive(s) in chart? Yes  Pre-existing out of facility DNR order (yellow form or pink MOST form) Yellow form placed in chart (order not valid for inpatient use);Pink MOST form placed in chart (order not valid for inpatient use)     Chief Complaint  Patient presents with  . Medical Management of Chronic Issues    routine    HPI:  Pt is a 82 y.o. female seen today for medical management of chronic diseases.  She has a hx of afib, htn, constipation, PD, dementia, and OA.  HTN: BP controlled, on apresoline, no prn clonidine use, occasional edema to the left ankle.  Remains unchanged from last visit.  Constipation: stable with miralax and senokot s  Dementia: non verbal, not able to follow commands, tracks with her eyes in the room, not on meds due to the advanced nature  PD: currently on sinemet, followed by neurology, increasing weakness and rigidity, especially on the  right.  Family has requested that we take over management due to challenges with getting patient out of facility as this progresses.  We are cutting down on unnecessary supplements and focusing on comfort care.  Past Medical History  Diagnosis Date  . Parkinson's disease ([place])   . Hypertension   . Arthritis     osteoarthritis  . Scoliosis   . Memory loss 06/09/2012  . Aortic valve disorders 04/11/2012  . Dementia in conditions classified elsewhere without behavioral disturbance 04/07/2012  . Hyperlipidemia   . Osteoarthrosis, unspecified whether generalized or localized, unspecified site   . Insomnia, unspecified 03/20/2012  . Fracture closed, humerus 03/20/2012  . Other closed fractures of distal end of radius (alone) 03/20/2012  . 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 Unspecified constipation   . Debility, unspecified 03/18/2012  . Anxiety   . Generalized anxiety disorder   . Urinary incontinence 07/05/2013    Re: PD  . Atrial fibrillation ([place])   . Dementia due to medical condition without behavioral disturbance 03/09/2012    04/07/12: ST evaluation revealed deficits in STM and visual spatial tasks. Some tasks not assessed due to language barrier. ST interventions continue.  05/10/2012:  Little progress with cognitive training;  patient always has someone with her to assist with problem solving.Mild, STML compensated with caregivers. Evaluation of functional status is difficult due to mobility limitation and dependence of caregiving staff.  06/06/12: No change.     Past Surgical History  Procedure Laterality Date  . Breast surgery  benign lumpectomy  . Cholecystectomy      Allergies  Allergen Reactions  . Iodine Anaphylaxis  . Iohexol      Desc: pt has had contrast in the past (years ago) and it causea anaphylaxis- per pt's daughter.  stephanie davis,rtrct, Onset Date: [id number]       Medication List       This list is  accurate as of: 05/14/15 11:59 PM.  Always use your most recent med list.               acetaminophen 500 MG tablet  Commonly known as:  TYLENOL  Take 500 mg by mouth. Take twice daily     aspirin 81 MG tablet  Take 81 mg by mouth daily.     bisacodyl 10 MG suppository  Commonly known as:  DULCOLAX  Place 10 mg rectally. One suppository, rectal daily as needed if no BM in two days     carbidopa-levodopa 25-100 MG tablet  Commonly known as:  SINEMET IR  Take 1 1/2 tablet 5 times a day, at 6 am, 10 am, 2 pm, 5:15 pm and bedtime.     cloNIDine 0.1 MG tablet  Commonly known as:  CATAPRES  Take 0.1 mg by mouth. Take one only if SBP 170 or greater; every 6 hours as needed     hydrALAZINE 50 MG tablet  Commonly known as:  APRESOLINE  50 mg. Take one three times daily hold for systolic 99991111     lactose free nutrition Liqd  Take 237 mLs by mouth. Once daily mid-afternoon     magnesium hydroxide 400 MG/5ML suspension  Commonly known as:  MILK OF MAGNESIA  Take 45 mLs by mouth daily as needed for mild constipation.     polyethylene glycol packet  Commonly known as:  MIRALAX / GLYCOLAX  Take 17 g by mouth daily as needed.     sennosides-docusate sodium 8.6-50 MG tablet  Commonly known as:  SENOKOT-S  Take 2 tablets by mouth at bedtime.       ROS obtained from caregiver and SNF staff as pt nonverbal at this point  Review of Systems  Unable to perform ROS: dementia  Constitutional: Negative for fever and chills.  Respiratory: Negative for shortness of breath.   Cardiovascular: Negative for chest pain.  Gastrointestinal: Negative for abdominal pain.  Genitourinary: Negative for dysuria.  Musculoskeletal: Positive for joint pain and falls.  Neurological: Positive for tremors and weakness. Negative for loss of consciousness.       Rigidity, advanced Parkinson's  Psychiatric/Behavioral: Positive for memory loss.    Immunization History  Administered Date(s) Administered  .  Influenza Whole 12/22/2011, 01/03/2013  . Influenza-Unspecified 12/26/2013, 01/03/2015  . PPD Test 04/21/2012  . Pneumococcal Conjugate-13 07/10/2014   Pertinent  Health Maintenance Due  Topic Date Due  . DEXA SCAN  12/09/1991  . PNA vac Low Risk Adult (2 of 2 - PPSV23) 07/10/2015  . INFLUENZA VACCINE  10/22/2015   Fall Risk  01/10/2014  Falls in the past year? No   Functional Status Survey: Is the patient deaf or have difficulty hearing?: No Does the patient have difficulty seeing, even when wearing glasses/contacts?: No Does the patient have difficulty concentrating, remembering, or making decisions?: Yes Does the patient have difficulty walking or climbing stairs?: Yes Does the patient have difficulty dressing or bathing?: Yes Does the patient have difficulty doing errands alone such as visiting a doctor's office or shopping?: Yes  Filed Vitals:  05/14/15 1534  BP: 140/80  Pulse: 72  Temp: 98.4 F (36.9 C)  Resp: 16  Height: 5\' 1"  (1.549 m)  Weight: 147 lb (66.679 kg)  SpO2: 95%   Body mass index is 27.79 kg/(m^2). Physical Exam  Constitutional: She appears well-developed and well-nourished. No distress.  Cardiovascular: Normal rate, regular rhythm, normal heart sounds and intact distal pulses.   Edema of ankles  Pulmonary/Chest: Effort normal and breath sounds normal. No respiratory distress.  Abdominal: Soft. Bowel sounds are normal.  Musculoskeletal:  Increasing rigid, tremulous on right greater than left  Neurological: She is alert. She exhibits abnormal muscle tone.  Tracking with eyes  Skin: Skin is warm and dry.  Psychiatric:  Flat affect    Labs reviewed:  Recent Labs  12/18/14  NA 136*  K 4.2  BUN 14  CREATININE 0.4*   No results for input(s): AST, ALT, ALKPHOS, BILITOT, PROT, ALBUMIN in the last 8760 hours.  Recent Labs  12/18/14  WBC 4.5  HGB 13.0  HCT 38  PLT 247   Lab Results  Component Value Date   TSH 2.21 12/18/2014   No  results found for: HGBA1C Lab Results  Component Value Date   CHOL 138 03/28/2013   HDL 76* 03/28/2013   LDLCALC 39 03/28/2013   TRIG 113 03/28/2013    Assessment/Plan 1. Parkinson disease ([place]) -her mobility is declining as this progresses -her daughter has decided along with neurology that keeping her in the building to manage this is best -cont ROM exercises -has been walking less since ankle injury so will continue regular moving to prevent pressure ulcers -cont current sinemet as per neurology's original orders  2. Dementia due to medical condition without behavioral disturbance -she is nonverbal at this point -not on meds due to goals of care and lack of benefit at this stage  3. Slow transit constipation -cont bowel regimen -this is likely neurogenic -maintain adequate hydration to help with this  4. Essential hypertension -bp well controlled with current regimen  5. Senile osteoporosis -noted with prior fractures of multiple bones -due to goal for comfort and qol at this point, cutting down on supplements so certainly not checking bone density or taking meds for this at this time  6. Hyperlipidemia -lipids are not a concern at this stage of her PD and dementia--quality not quantity  7. Other urinary incontinence -likely this is neurogenic bladder due to Parkinson's, cont incontinence care -avoid checking for UTIs unless clear symptoms present and cleared by provider (has been treated multiple previous times and developed yeast infection from abx)  Family/ staff Communication: discussed with SNF nursing and caregiver  Labs/tests ordered:  No new  Brandin Dilday L. Ramia Sidney, D.O. [facility] [address], [place] [address] Cell Phone (Mon-Fri 8am-5pm):  [phone] On Call:  [phone] & follow prompts after 5pm & weekends Office Phone:  [phone] Office Fax:  [phone]

## 2015-05-16 ENCOUNTER — Telehealth: Payer: Self-pay | Admitting: Neurology

## 2015-05-16 NOTE — Telephone Encounter (Signed)
Patient's daughter Dr. Durenda Hurt is calling to discuss her mother's condition. Dr. Carlisle Cater can be reached at [phone]. The patient had a follow up appointment with Dr. Rexene Alberts on 06-06-15 but was cancelled because the patient is seeing a doctor at [facility] where she resides.

## 2015-05-16 NOTE — Telephone Encounter (Signed)
Dr. Carlisle Cater asked if you can call her. She would like to discuss the progression of her mother. Patient is still at [facility] and is now unable to speak. She has to be lifted to transfer. Daughter states that it is getting very hard for patient to be brought here and PCP at [facility] feels comfortable taking over her medications. Daughter wanted to talk to you about the pros and cons of this and states she needs "a peep talk" from you about this progression.

## 2015-05-17 NOTE — Telephone Encounter (Signed)
I called and talked to Dr. Carlisle Cater [name]), patient's daughter. Patient is non-verbal, lethargic, not able to mobilize even with max. assistance.  She has 24/7 care in addition to being in skilled care at [facility].  She has some ROM exercises, not in visible distress, no decubitus, she does stand up a couple of times a day - with maximum assistance.  Can swallow and gets her C/L.  We both agreed, that the goal is to keep her comfortable, safe, and supportive care. She is in very good care at has full supervision, not in pain.  Ultimately, daughter agrees, that when the time comes, she has to let her go.  I agreed, that outpatient neuro visits are not critical at this time. She geriatrician is comfortable writing her sinemet Rx and we also agreed that medications, like vitamins and minerals PO are not essential; she is still drinking some Ensure/Boost, which is fortified with protein, minerals and vitamins.  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

## 2015-05-31 ENCOUNTER — Encounter: Payer: Self-pay | Admitting: *Deleted

## 2015-06-06 ENCOUNTER — Ambulatory Visit: Payer: Medicare Other | Admitting: Neurology

## 2015-06-16 DIAGNOSIS — M810 Age-related osteoporosis without current pathological fracture: Secondary | ICD-10-CM | POA: Insufficient documentation

## 2015-06-20 ENCOUNTER — Encounter: Payer: Self-pay | Admitting: Adult Health

## 2015-06-20 ENCOUNTER — Non-Acute Institutional Stay (SKILLED_NURSING_FACILITY): Payer: Medicare Other | Admitting: Adult Health

## 2015-06-20 DIAGNOSIS — R1310 Dysphagia, unspecified: Secondary | ICD-10-CM | POA: Diagnosis not present

## 2015-06-20 DIAGNOSIS — G20 Parkinson's disease: Secondary | ICD-10-CM | POA: Diagnosis not present

## 2015-06-20 DIAGNOSIS — K5901 Slow transit constipation: Secondary | ICD-10-CM

## 2015-06-20 DIAGNOSIS — I10 Essential (primary) hypertension: Secondary | ICD-10-CM | POA: Diagnosis not present

## 2015-06-20 DIAGNOSIS — F0280 Dementia in other diseases classified elsewhere without behavioral disturbance: Secondary | ICD-10-CM

## 2015-06-20 NOTE — Progress Notes (Signed)
Patient ID: Vanessa Mcbride, female   DOB: [birth year], 82 y.o.   MRN: [id number]  Location:  [place] of Service:  SNF (31) Provider:   [name], ANP [place] [phone]   Mcbride, Vanessa Sidle, DO  Patient Care Team: Vanessa Curry, DO as PCP - General (Geriatric Medicine) [facility] Star Age, MD as Attending Physician (Neurology) Vanessa Pali, NP as Nurse Practitioner (Neurology)  Extended Emergency Contact Information Primary Emergency Contact: Vanessa,Mcbride Address: [address]          [place], [place] [address] [name] of [place] Phone: [phone] Mobile Phone: [phone] Relation: Daughter  Code Status:  DNR Goals of care: Advanced Directive information Advanced Directives 05/14/2015  Does patient have an advance directive? Yes  Type of Paramedic of [place];Living will;Out of facility DNR (pink MOST or yellow form)  Does patient want to make changes to advanced directive? -  Copy of advanced directive(s) in chart? Yes  Pre-existing out of facility DNR order (yellow form or pink MOST form) Yellow form placed in chart (order not valid for inpatient use);Pink MOST form placed in chart (order not valid for inpatient use)     Chief Complaint  Patient presents with  . Medical Management of Chronic Issues    HPI:  Pt is a 82 y.o. female seen today for medical management of chronic diseases.    1. Parkinson's disease [facility]) -Resident has had progressive decline , sleeping more, eating less -Family has decided to forego further neuro visits  2. Dementia due to medical condition without behavioral disturbance -non verbal and non ambulatory -has 24 hr care givers  3. Slow transit constipation -controlled with senna  4. Essential hypertension -most readings are WNL, one dose of clonidine given in the past two weeks -remains on hydralazine TID  5.  Dysphagia -currently on puree diet with NTL -coughs with meals at times but no recent bouts of pna  Past Medical History  Diagnosis Date  . Parkinson's disease ([place])   . Hypertension   . Arthritis     osteoarthritis  . Scoliosis   . Memory loss 06/09/2012  . Aortic valve disorders 04/11/2012  . Dementia in conditions classified elsewhere without behavioral disturbance 04/07/2012  . Hyperlipidemia   . Osteoarthrosis, unspecified whether generalized or localized, unspecified site   . Insomnia, unspecified 03/20/2012  . Fracture closed, humerus 03/20/2012  . Other closed fractures of distal end of radius (alone) 03/20/2012  . 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 Unspecified constipation   . Debility, unspecified 03/18/2012  . Anxiety   . Generalized anxiety disorder   . Urinary incontinence 07/05/2013    Re: PD  . Atrial fibrillation ([place])   . Dementia due to medical condition without behavioral disturbance 03/09/2012    04/07/12: ST evaluation revealed deficits in STM and visual spatial tasks. Some tasks not assessed due to language barrier. ST interventions continue.  05/10/2012:  Little progress with cognitive training;  patient always has someone with her to assist with problem solving.Mild, STML compensated with caregivers. Evaluation of functional status is difficult due to mobility limitation and dependence of caregiving staff.  06/06/12: No change.     Past Surgical History  Procedure Laterality Date  . Breast surgery      benign lumpectomy  . Cholecystectomy      Allergies  Allergen Reactions  . Iodine Anaphylaxis  .  Iohexol      Desc: pt has had contrast in the past (years ago) and it causea anaphylaxis- per pt's daughter.  stephanie davis,rtrct, Onset Date: [id number]       Medication List       This list is accurate as of: 06/20/15  3:06 PM.  Always use your most recent med list.               acetaminophen 500  MG tablet  Commonly known as:  TYLENOL  Take 500 mg by mouth. Take twice daily     aspirin 81 MG tablet  Take 81 mg by mouth daily.     bisacodyl 10 MG suppository  Commonly known as:  DULCOLAX  Place 10 mg rectally. One suppository, rectal daily as needed if no BM in two days     carbidopa-levodopa 25-100 MG tablet  Commonly known as:  SINEMET IR  Take 1 1/2 tablet 5 times a day, at 6 am, 10 am, 2 pm, 5:15 pm and bedtime.     cloNIDine 0.1 MG tablet  Commonly known as:  CATAPRES  Take 0.1 mg by mouth. Take one only if SBP 170 or greater; every 6 hours as needed     hydrALAZINE 50 MG tablet  Commonly known as:  APRESOLINE  50 mg. Take one three times daily hold for systolic 99991111     lactose free nutrition Liqd  Take 237 mLs by mouth. Once daily mid-afternoon     loratadine 10 MG tablet  Commonly known as:  CLARITIN  Take 10 mg by mouth daily.     magnesium hydroxide 400 MG/5ML suspension  Commonly known as:  MILK OF MAGNESIA  Take 45 mLs by mouth daily as needed for mild constipation.     polyethylene glycol packet  Commonly known as:  MIRALAX / GLYCOLAX  Take 17 g by mouth daily as needed.     sennosides-docusate sodium 8.6-50 MG tablet  Commonly known as:  SENOKOT-S  Take 2 tablets by mouth at bedtime.        Review of Systems  Unable to perform ROS: Dementia    Immunization History  Administered Date(s) Administered  . Influenza Whole 12/22/2011, 01/03/2013  . Influenza-Unspecified 12/26/2013, 01/03/2015  . PPD Test 04/21/2012  . Pneumococcal Conjugate-13 07/10/2014   Pertinent  Health Maintenance Due  Topic Date Due  . PNA vac Low Risk Adult (2 of 2 - PPSV23) 07/10/2015  . INFLUENZA VACCINE  10/22/2015   Fall Risk  01/10/2014  Falls in the past year? No   Functional Status Survey:   . Wt Readings from Last 3 Encounters:  06/20/15 144 lb 9.6 oz (65.59 kg)  05/14/15 147 lb (66.679 kg)  04/19/15 144 lb (65.318 kg)   Filed Vitals:   06/20/15  1454  BP: 130/78  Pulse: 81  Temp: 98.5 F (36.9 C)  Resp: 18  Weight: 144 lb 9.6 oz (65.59 kg)  SpO2: 93%   Body mass index is 27.34 kg/(m^2). Physical Exam  Constitutional: No distress.  Eyes: Conjunctivae are normal. Pupils are equal, round, and reactive to light. Right eye exhibits no discharge. Left eye exhibits no discharge.  Cardiovascular: Normal rate and regular rhythm.   Pulmonary/Chest: Effort normal and breath sounds normal.  Abdominal: Soft.  Genitourinary: Vagina normal.  Musculoskeletal:  Rigidity noted to both upper ext, worse on the right  Neurological: She is alert.  Skin: Skin is warm and dry. She is not diaphoretic.    Labs  reviewed:  Recent Labs  12/18/14  NA 136*  K 4.2  BUN 14  CREATININE 0.4*   No results for input(s): AST, ALT, ALKPHOS, BILITOT, PROT, ALBUMIN in the last 8760 hours.  Recent Labs  12/18/14  WBC 4.5  HGB 13.0  HCT 38  PLT 247   Lab Results  Component Value Date   TSH 2.21 12/18/2014   No results found for: HGBA1C Lab Results  Component Value Date   CHOL 138 03/28/2013   HDL 76* 03/28/2013   LDLCALC 39 03/28/2013   TRIG 113 03/28/2013    Significant Diagnostic Results in last 30 days:  No results found.  Assessment/Plan 1. Parkinson's disease ([place]) -progressive -continue supportive care -continue sinemet  2. Dementia due to medical condition without behavioral disturbance -late stage -no meds due to the advanced nature  3. Slow transit constipation -controlled -continue senna  4. Essential hypertension -controlled -continue hydralazine  5. Dysphagia -continue puree diet and NTL -would not implement feeding tube due to goals of care, which are comfort base  Family/ staff Communication: discussed with sitter and nurse  [name], [place] [phone]

## 2015-07-18 ENCOUNTER — Non-Acute Institutional Stay (SKILLED_NURSING_FACILITY): Payer: Medicare Other | Admitting: Adult Health

## 2015-07-18 DIAGNOSIS — I10 Essential (primary) hypertension: Secondary | ICD-10-CM

## 2015-07-18 DIAGNOSIS — G20 Parkinson's disease: Secondary | ICD-10-CM

## 2015-07-18 DIAGNOSIS — G20A1 Parkinson's disease without dyskinesia, without mention of fluctuations: Secondary | ICD-10-CM

## 2015-07-18 DIAGNOSIS — K5901 Slow transit constipation: Secondary | ICD-10-CM

## 2015-07-18 DIAGNOSIS — M1990 Unspecified osteoarthritis, unspecified site: Secondary | ICD-10-CM

## 2015-07-18 NOTE — Progress Notes (Signed)
Patient ID: LARI ARIAZ, female   DOB: 1924-12-01, 82 y.o.   MRN: [id number]  Location:  [place] of Service:  SNF (31) Provider:   [name], ANP [place] [phone]  [name], [name], DO  Patient Care Team: [name], DO as PCP - General (Geriatric Medicine) [facility] Star Age, MD as Attending Physician (Neurology) [name], NP as Nurse Practitioner (Neurology)  Extended Emergency Contact Information Primary Emergency Contact: Craigo,[name] Address: [address]          [place], [place] [address] [name] of [place] Phone: [phone] Mobile Phone: [phone] Relation: Daughter  Code Status:  DNR Goals of care: Advanced Directive information Advanced Directives 05/14/2015  Does patient have an advance directive? Yes  Type of Paramedic of [place];Living will;Out of facility DNR (pink MOST or yellow form)  Does patient want to make changes to advanced directive? -  Copy of advanced directive(s) in chart? Yes  Pre-existing out of facility DNR order (yellow form or pink MOST form) Yellow form placed in chart (order not valid for inpatient use);Pink MOST form placed in chart (order not valid for inpatient use)     Chief Complaint  Patient presents with  . Medical Management of Chronic Issues    HPI:  Pt is a 82 y.o. female seen today for medical management of chronic diseases.    1. Essential hypertension She is taking hydralazine. Her BPs have been controlled in 130-140/70-80 ranges. She takes clonidine as needed for BP over 170 sys. She has not need the clonidine in the past 2 weeks.   2. Parkinson disease ([place]) She is taking Sinemet IR. She has R sided dominate Parkinson's. Her last consult with Neurology was in 01/2015. She is nonverbal. She is a [employer] fast stream[employer] but stands periodically with assistance for exercise. She is having worsening functional decline  per caregiver. She is sleeping more during the day. She has a decreased appetite.   3. Slow transit constipation Last BM yesterday. She is taking senokot, miralax and ducolax as needed for constipation. She has not used prn medications for constipation for the past 2 weeks.   4. Osteoarthritis, unspecified osteoarthritis type, unspecified site She is taking Tylenol daily. No reports of pain per caregiver.   Past Medical History  Diagnosis Date  . Parkinson's disease ([place])   . Hypertension   . Arthritis     osteoarthritis  . Scoliosis   . Memory loss 06/09/2012  . Aortic valve disorders 04/11/2012  . Dementia in conditions classified elsewhere without behavioral disturbance 04/07/2012  . Hyperlipidemia   . Osteoarthrosis, unspecified whether generalized or localized, unspecified site   . Insomnia, unspecified 03/20/2012  . Fracture closed, humerus 03/20/2012  . Other closed fractures of distal end of radius (alone) 03/20/2012  . 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 Unspecified constipation   . Debility, unspecified 03/18/2012  . Anxiety   . Generalized anxiety disorder   . Urinary incontinence 07/05/2013    Re: PD  . Atrial fibrillation ([place])   . Dementia due to medical condition without behavioral disturbance 03/09/2012    04/07/12: ST evaluation revealed deficits in STM and visual spatial tasks. Some tasks not assessed due to language barrier. ST interventions continue.  05/10/2012:  Little progress with cognitive training;  patient always has someone with her to assist with problem solving.Mild, STML compensated with caregivers. Evaluation of  functional status is difficult due to mobility limitation and dependence of caregiving staff.  06/06/12: No change.     Past Surgical History  Procedure Laterality Date  . Breast surgery      benign lumpectomy  . Cholecystectomy      Allergies  Allergen Reactions  . Iodine Anaphylaxis  .  Iohexol      Desc: pt has had contrast in the past (years ago) and it causea anaphylaxis- per pt's daughter.  stephanie davis,rtrct, Onset Date: [id number]       Medication List       This list is accurate as of: 07/18/15  3:10 PM.  Always use your most recent med list.               acetaminophen 500 MG tablet  Commonly known as:  TYLENOL  Take 500 mg by mouth. Take twice daily     aspirin 81 MG tablet  Take 81 mg by mouth daily.     bisacodyl 10 MG suppository  Commonly known as:  DULCOLAX  Place 10 mg rectally. One suppository, rectal daily as needed if no BM in two days     carbidopa-levodopa 25-100 MG tablet  Commonly known as:  SINEMET IR  Take 1 1/2 tablet 5 times a day, at 6 am, 10 am, 2 pm, 5:15 pm and bedtime.     cloNIDine 0.1 MG tablet  Commonly known as:  CATAPRES  Take 0.1 mg by mouth. Take one only if SBP 170 or greater; every 6 hours as needed     hydrALAZINE 50 MG tablet  Commonly known as:  APRESOLINE  50 mg. Take one three times daily hold for systolic 99991111     lactose free nutrition Liqd  Take 237 mLs by mouth. Once daily mid-afternoon     loratadine 10 MG tablet  Commonly known as:  CLARITIN  Take 10 mg by mouth daily.     magnesium hydroxide 400 MG/5ML suspension  Commonly known as:  MILK OF MAGNESIA  Take 45 mLs by mouth daily as needed for mild constipation.     polyethylene glycol packet  Commonly known as:  MIRALAX / GLYCOLAX  Take 17 g by mouth daily as needed.     sennosides-docusate sodium 8.6-50 MG tablet  Commonly known as:  SENOKOT-S  Take 2 tablets by mouth at bedtime.        Review of Systems  Unable to perform ROS: Patient nonverbal    Immunization History  Administered Date(s) Administered  . Influenza Whole 12/22/2011, 01/03/2013  . Influenza-Unspecified 12/26/2013, 01/03/2015  . PPD Test 04/21/2012  . Pneumococcal Conjugate-13 07/10/2014   Pertinent  Health Maintenance Due  Topic Date Due  . PNA vac Low Risk  Adult (2 of 2 - PPSV23) 07/10/2015  . INFLUENZA VACCINE  10/22/2015   Fall Risk  01/10/2014  Falls in the past year? No   Functional Status Survey:   Wt Readings from Last 3 Encounters:  07/18/15 145 lb 9.6 oz (66.044 kg)  06/20/15 144 lb 9.6 oz (65.59 kg)  05/14/15 147 lb (66.679 kg)    Filed Vitals:   07/18/15 1507  Weight: 145 lb 9.6 oz (66.044 kg)   Body mass index is 27.53 kg/(m^2). Physical Exam  Constitutional: No distress.  HENT:  Mouth/Throat: Oropharynx is clear and moist.  Eyes: Pupils are equal, round, and reactive to light.  Neck: No JVD present.  Cardiovascular: Normal rate, regular rhythm, normal heart sounds and intact distal pulses.  Exam reveals no friction rub.   No murmur heard. Pulmonary/Chest: No respiratory distress. She has no wheezes. She has no rales.  Diminished breath sounds. Pt unable to follow commands to take deep breath.   Abdominal: Soft. Bowel sounds are normal. She exhibits no distension and no mass.  Musculoskeletal: She exhibits no edema.  Passive ROM with increased rigidity throughout all joints, but more so on the RUE  Lymphadenopathy:    She has no cervical adenopathy.  Neurological:  Nonverbal  Skin: Skin is warm and dry. No rash noted. She is not diaphoretic. No erythema.  Nursing note and vitals reviewed.   Labs reviewed:  Recent Labs  12/18/14  NA 136*  K 4.2  BUN 14  CREATININE 0.4*   No results for input(s): AST, ALT, ALKPHOS, BILITOT, PROT, ALBUMIN in the last 8760 hours.  Recent Labs  12/18/14  WBC 4.5  HGB 13.0  HCT 38  PLT 247   Lab Results  Component Value Date   TSH 2.21 12/18/2014   No results found for: HGBA1C Lab Results  Component Value Date   CHOL 138 03/28/2013   HDL 76* 03/28/2013   LDLCALC 39 03/28/2013   TRIG 113 03/28/2013    Significant Diagnostic Results in last 30 days:  No results found.  Assessment/Plan 1. Essential hypertension Stable Continue Hydralazine. Continue  clonidine as needed.   2. Parkinson disease ([place]) Declining function.  Nonverbal.  Continue Sinemet IR   3. Slow transit constipation Controlled Continue Senokot, Miralax, and Dulcolax as need.   4. Osteoarthritis, unspecified osteoarthritis type, unspecified site Stable Continue Tylenol for pain.     Family/ staff Communication: discussed with caregiver, Tor Netters ordered: None, annual labs due to in Sept

## 2015-08-23 ENCOUNTER — Non-Acute Institutional Stay (SKILLED_NURSING_FACILITY): Payer: Medicare Other | Admitting: Adult Health

## 2015-08-23 DIAGNOSIS — F0280 Dementia in other diseases classified elsewhere without behavioral disturbance: Secondary | ICD-10-CM | POA: Diagnosis not present

## 2015-08-23 DIAGNOSIS — I10 Essential (primary) hypertension: Secondary | ICD-10-CM | POA: Diagnosis not present

## 2015-08-23 DIAGNOSIS — G20 Parkinson's disease: Secondary | ICD-10-CM | POA: Diagnosis not present

## 2015-08-23 DIAGNOSIS — I4891 Unspecified atrial fibrillation: Secondary | ICD-10-CM

## 2015-08-23 DIAGNOSIS — G20A1 Parkinson's disease without dyskinesia, without mention of fluctuations: Secondary | ICD-10-CM

## 2015-08-23 DIAGNOSIS — R630 Anorexia: Secondary | ICD-10-CM | POA: Diagnosis not present

## 2015-08-23 DIAGNOSIS — S22000D Wedge compression fracture of unspecified thoracic vertebra, subsequent encounter for fracture with routine healing: Secondary | ICD-10-CM

## 2015-08-23 NOTE — Progress Notes (Signed)
Patient ID: Vanessa Mcbride, female   DOB: 12/26/1924, 82 y.o.   MRN: [id number]  Location:  [place] of Service:  SNF (31) Provider:   [name], ANP [place] [phone]  [name], [name], DO  Patient Care Team: [name], DO as PCP - General (Geriatric Medicine) [facility] Star Age, MD as Attending Physician (Neurology) [name], NP as Nurse Practitioner (Neurology)  Extended Emergency Contact Information Primary Emergency Contact: Springborn,[name] Address: [address]          [place], [place] [address] [name] of [place] Phone: [phone] Mobile Phone: [phone] Relation: Daughter  Code Status:  DNR Goals of care: Advanced Directive information Advanced Directives 08/23/2015  Does patient have an advance directive? Yes  Type of Paramedic of [place];Living will;Out of facility DNR (pink MOST or yellow form)  Copy of advanced directive(s) in chart? Yes  Pre-existing out of facility DNR order (yellow form or pink MOST form) -     Chief Complaint  Patient presents with  . Medical Management of Chronic Issues    HPI:  Pt is a 82 y.o. female seen today for medical management of chronic diseases.  Resides at [facility] in skilled care due to advanced PD. Staff reports that her appetite has decreased over time, typically only eating 25% of her meal. She is sleeping more through out the day but continues to have a wakeful period in the afternoon and occasionally stand with max assist. Despite this her weight has remained stable at 144 lbs. BP has ranged 107-188/76-86. Her caregiver also reports coughing with meals that prohibit further feeding. She is on a puree diet with NTL.  Her goals of care are comfort based.     Past Medical History  Diagnosis Date  . Parkinson's disease ([place])   . Hypertension   . Arthritis     osteoarthritis  . Scoliosis   . Memory loss  06/09/2012  . Aortic valve disorders 04/11/2012  . Dementia in conditions classified elsewhere without behavioral disturbance 04/07/2012  . Hyperlipidemia   . Osteoarthrosis, unspecified whether generalized or localized, unspecified site   . Insomnia, unspecified 03/20/2012  . Fracture closed, humerus 03/20/2012  . Other closed fractures of distal end of radius (alone) 03/20/2012  . 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 Unspecified constipation   . Debility, unspecified 03/18/2012  . Anxiety   . Generalized anxiety disorder   . Urinary incontinence 07/05/2013    Re: PD  . Atrial fibrillation ([place])   . Dementia due to medical condition without behavioral disturbance 03/09/2012    04/07/12: ST evaluation revealed deficits in STM and visual spatial tasks. Some tasks not assessed due to language barrier. ST interventions continue.  05/10/2012:  Little progress with cognitive training;  patient always has someone with her to assist with problem solving.Mild, STML compensated with caregivers. Evaluation of functional status is difficult due to mobility limitation and dependence of caregiving staff.  06/06/12: No change.     Past Surgical History  Procedure Laterality Date  . Breast surgery      benign lumpectomy  . Cholecystectomy      Allergies  Allergen Reactions  . Iodine Anaphylaxis  . Iohexol      Desc: pt has had contrast in the past (years ago) and it causea anaphylaxis- per pt's daughter.  stephanie davis,rtrct, Onset Date: [id number]  Medication List       This list is accurate as of: 08/23/15  9:15 AM.  Always use your most recent med list.               acetaminophen 500 MG tablet  Commonly known as:  TYLENOL  Take 500 mg by mouth. Take twice daily     aspirin 81 MG tablet  Take 81 mg by mouth daily.     bisacodyl 10 MG suppository  Commonly known as:  DULCOLAX  Place 10 mg rectally. One suppository, rectal daily as  needed if no BM in two days     carbidopa-levodopa 25-100 MG tablet  Commonly known as:  SINEMET IR  Take 1 1/2 tablet 5 times a day, at 6 am, 10 am, 2 pm, 5:15 pm and bedtime.     cloNIDine 0.1 MG tablet  Commonly known as:  CATAPRES  Take 0.1 mg by mouth. Take one only if SBP 170 or greater; every 6 hours as needed     hydrALAZINE 50 MG tablet  Commonly known as:  APRESOLINE  50 mg. Take one three times daily hold for systolic 99991111     lactose free nutrition Liqd  Take 237 mLs by mouth. Once daily mid-afternoon     magnesium hydroxide 400 MG/5ML suspension  Commonly known as:  MILK OF MAGNESIA  Take 45 mLs by mouth daily as needed for mild constipation.     polyethylene glycol packet  Commonly known as:  MIRALAX / GLYCOLAX  Take 17 g by mouth daily as needed.     sennosides-docusate sodium 8.6-50 MG tablet  Commonly known as:  SENOKOT-S  Take 2 tablets by mouth at bedtime.        Review of Systems  Unable to perform ROS: Patient nonverbal    Immunization History  Administered Date(s) Administered  . Influenza Whole 12/22/2011, 01/03/2013  . Influenza-Unspecified 12/26/2013, 01/03/2015  . PPD Test 04/21/2012  . Pneumococcal Conjugate-13 07/10/2014   Pertinent  Health Maintenance Due  Topic Date Due  . PNA vac Low Risk Adult (2 of 2 - PPSV23) 07/10/2015  . INFLUENZA VACCINE  10/22/2015   Fall Risk  08/23/2015 01/10/2014  Falls in the past year? No No   Functional Status Survey:   Wt Readings from Last 3 Encounters:  08/23/15 144 lb 12.8 oz (65.681 kg)  07/18/15 145 lb 9.6 oz (66.044 kg)  06/20/15 144 lb 9.6 oz (65.59 kg)    Filed Vitals:   08/23/15 0910  BP: 100/76  Pulse: 66  Temp: 98.6 F (37 C)  Resp: 18  Weight: 144 lb 12.8 oz (65.681 kg)  SpO2: 95%   Body mass index is 27.37 kg/(m^2). Physical Exam  Constitutional: No distress.  HENT:  Mouth/Throat: Oropharynx is clear and moist.  Eyes: Pupils are equal, round, and reactive to light.    Neck: No JVD present.  Cardiovascular: Normal rate, regular rhythm, normal heart sounds and intact distal pulses.  Exam reveals no friction rub.   No murmur heard. Pulmonary/Chest: No respiratory distress. She has no wheezes. She has no rales.  Diminished breath sounds. Pt unable to follow commands to take deep breath.   Abdominal: Soft. Bowel sounds are normal. She exhibits no distension and no mass.  Musculoskeletal: She exhibits no edema.  Passive ROM with increased rigidity throughout all joints, but more so on the RUE  Lymphadenopathy:    She has no cervical adenopathy.  Neurological:  Nonverbal  Skin: Skin is  warm and dry. No rash noted. She is not diaphoretic. No erythema.  Nursing note and vitals reviewed.   Labs reviewed:  Recent Labs  12/18/14  NA 136*  K 4.2  BUN 14  CREATININE 0.4*   No results for input(s): AST, ALT, ALKPHOS, BILITOT, PROT, ALBUMIN in the last 8760 hours.  Recent Labs  12/18/14  WBC 4.5  HGB 13.0  HCT 38  PLT 247   Lab Results  Component Value Date   TSH 2.21 12/18/2014   No results found for: HGBA1C Lab Results  Component Value Date   CHOL 138 03/28/2013   HDL 76* 03/28/2013   LDLCALC 39 03/28/2013   TRIG 113 03/28/2013    Significant Diagnostic Results in last 30 days:  No results found.  Assessment/Plan  1. Dementia due to medical condition without behavioral disturbance Advancing, continue supportive care only Have staff update most form to reflect current goals of care which are comfort only, if necessary we can set up a meeting with her daughter  2. Essential hypertension Controlled, continue current meds  3. Atrial fibrillation with RVR (HCC) -rate controlled -continue aspirin only  4. Parkinson disease ([place]) -worsening with minimal physical activity -continue sinemet which may help with wakeful period in the afternoon  5. Compression fracture of thoracic vertebra, with routine healing, subsequent  encounter -noted in 2015 -no issues with pain per caregiver -no OP meds as she is not ambulatory -continue scheduled tylenol  6. Poor appetite -due to overall declining functional and cognitive status -continue nutritional supplements -would not add an appetite stimulant due to lack of benefit    Family/ staff Communication: discussed with caregiver, Juliann Pulse  Labs/tests ordered: NA

## 2015-09-23 ENCOUNTER — Non-Acute Institutional Stay (SKILLED_NURSING_FACILITY): Payer: Medicare Other | Admitting: Adult Health

## 2015-09-23 DIAGNOSIS — I4891 Unspecified atrial fibrillation: Secondary | ICD-10-CM | POA: Diagnosis not present

## 2015-09-23 DIAGNOSIS — F411 Generalized anxiety disorder: Secondary | ICD-10-CM | POA: Diagnosis not present

## 2015-09-23 DIAGNOSIS — I10 Essential (primary) hypertension: Secondary | ICD-10-CM

## 2015-09-23 DIAGNOSIS — K5901 Slow transit constipation: Secondary | ICD-10-CM

## 2015-09-23 DIAGNOSIS — G20 Parkinson's disease: Secondary | ICD-10-CM | POA: Diagnosis not present

## 2015-10-07 ENCOUNTER — Encounter: Payer: Self-pay | Admitting: Adult Health

## 2015-10-07 NOTE — Progress Notes (Signed)
Patient ID: Vanessa Mcbride, female   DOB: 21-Dec-1924, 82 y.o.   MRN: [id number]  Location:   [facility]   Place of Service:  SNF (31) Provider:   [name], ANP [facility] [phone]  [name], [name], DO  Patient Care Team: [name], DO as PCP - General (Geriatric Medicine) [facility] Star Age, MD as Attending Physician (Neurology) [name], NP as Nurse Practitioner (Neurology)  Extended Emergency Contact Information Primary Emergency Contact: Brent,[name] Address: [address]          [place], [place] [address] [name] of [place] Phone: [phone] Mobile Phone: [phone] Relation: Daughter  Code Status:  DNR Goals of care: Advanced Directive information Advanced Directives 08/23/2015  Does patient have an advance directive? Yes  Type of Paramedic of [place];Living will;Out of facility DNR (pink MOST or yellow form)  Copy of advanced directive(s) in chart? Yes  Pre-existing out of facility DNR order (yellow form or pink MOST form) -     Chief Complaint  Patient presents with  . Medical Management of Chronic Issues    HPI:  Pt is a 82 y.o. female seen today for medical management of chronic diseases.  Resides at [facility] in skilled care due to advanced PD. Staff reports that her appetite has decreased over time, typically only eating 25% of her meal. She is sleeping more through out the day, also more rigid over time. Continues on sinemet. Weight remains stable at 145 lbs. Goals of care are comfort based at this time.  Has a hx of HTN currently on hydralazine and clonidine.  BP elevated occasionally but mostly controlled. She is uncomfortable during our visit and the staff states she has not had a BM in several days. Her face is red and she appear to be bearing down.     Past Medical History  Diagnosis Date  . Parkinson's disease ([place])   . Hypertension   . Arthritis    osteoarthritis  . Scoliosis   . Memory loss 06/09/2012  . Aortic valve disorders 04/11/2012  . Dementia in conditions classified elsewhere without behavioral disturbance 04/07/2012  . Hyperlipidemia   . Osteoarthrosis, unspecified whether generalized or localized, unspecified site   . Insomnia, unspecified 03/20/2012  . Fracture closed, humerus 03/20/2012  . Other closed fractures of distal end of radius (alone) 03/20/2012  . 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 Unspecified constipation   . Debility, unspecified 03/18/2012  . Anxiety   . Generalized anxiety disorder   . Urinary incontinence 07/05/2013    Re: PD  . Atrial fibrillation ([place])   . Dementia due to medical condition without behavioral disturbance 03/09/2012    04/07/12: ST evaluation revealed deficits in STM and visual spatial tasks. Some tasks not assessed due to language barrier. ST interventions continue.  05/10/2012:  Little progress with cognitive training;  patient always has someone with her to assist with problem solving.Mild, STML compensated with caregivers. Evaluation of functional status is difficult due to mobility limitation and dependence of caregiving staff.  06/06/12: No change.     Past Surgical History  Procedure Laterality Date  . Breast surgery      benign lumpectomy  . Cholecystectomy      Allergies  Allergen Reactions  . Iodine Anaphylaxis  . Iohexol      Desc: pt has had contrast in the past (years ago) and it causea anaphylaxis- per pt's  daughter.  stephanie davis,rtrct, Onset Date: [id number]       Medication List       This list is accurate as of: 09/23/15 11:59 PM.  Always use your most recent med list.               acetaminophen 500 MG tablet  Commonly known as:  TYLENOL  Take 500 mg by mouth. Take twice daily     aspirin 81 MG tablet  Take 81 mg by mouth daily.     bisacodyl 10 MG suppository  Commonly known as:  DULCOLAX  Place 10  mg rectally. One suppository, rectal daily as needed if no BM in two days     carbidopa-levodopa 25-100 MG tablet  Commonly known as:  SINEMET IR  Take 1 1/2 tablet 5 times a day, at 6 am, 10 am, 2 pm, 5:15 pm and bedtime.     cloNIDine 0.1 MG tablet  Commonly known as:  CATAPRES  Take 0.1 mg by mouth. Take one only if SBP 170 or greater; every 6 hours as needed     hydrALAZINE 50 MG tablet  Commonly known as:  APRESOLINE  50 mg. Take one three times daily hold for systolic 99991111     lactose free nutrition Liqd  Take 237 mLs by mouth. Once daily mid-afternoon     magnesium hydroxide 400 MG/5ML suspension  Commonly known as:  MILK OF MAGNESIA  Take 45 mLs by mouth daily as needed for mild constipation.     polyethylene glycol packet  Commonly known as:  MIRALAX / GLYCOLAX  Take 17 g by mouth daily as needed.     sennosides-docusate sodium 8.6-50 MG tablet  Commonly known as:  SENOKOT-S  Take 2 tablets by mouth at bedtime.        Review of Systems  Unable to perform ROS: Patient nonverbal    Immunization History  Administered Date(s) Administered  . Influenza Whole 12/22/2011, 01/03/2013  . Influenza-Unspecified 12/26/2013, 01/03/2015  . PPD Test 04/21/2012  . Pneumococcal Conjugate-13 07/10/2014   Pertinent  Health Maintenance Due  Topic Date Due  . PNA vac Low Risk Adult (2 of 2 - PPSV23) 07/10/2015  . INFLUENZA VACCINE  10/22/2015   Fall Risk  08/23/2015 01/10/2014  Falls in the past year? No No   Functional Status Survey:   Wt Readings from Last 3 Encounters:  10/07/15 145 lb 9.6 oz (66.044 kg)  08/23/15 144 lb 12.8 oz (65.681 kg)  07/18/15 145 lb 9.6 oz (66.044 kg)    Filed Vitals:   10/07/15 1639  BP: 164/92  Weight: 145 lb 9.6 oz (66.044 kg)   Body mass index is 27.53 kg/(m^2). Physical Exam  Constitutional: She appears distressed (mildy due to constipation, trembling and face red).  HENT:  Mouth/Throat: Oropharynx is clear and moist.  Eyes:  Pupils are equal, round, and reactive to light.  Neck: No JVD present.  Cardiovascular: Normal rate, regular rhythm, normal heart sounds and intact distal pulses.  Exam reveals no friction rub.   No murmur heard. Pulmonary/Chest: Effort normal. No respiratory distress. She has no wheezes. She has no rales.  Diminished breath sounds. Pt unable to follow commands to take deep breath.   Abdominal: Soft. She exhibits no distension and no mass.  Hyperactive bs  Genitourinary: Vagina normal.  Musculoskeletal: She exhibits no edema.  Passive ROM with increased rigidity throughout all joints, but more so on the RUE  Lymphadenopathy:    She has no  cervical adenopathy.  Neurological: She is alert.  Nonverbal, not able to f/c  Skin: Skin is warm and dry. No rash noted. She is not diaphoretic. No erythema.  Nursing note and vitals reviewed.   Labs reviewed:  Recent Labs  12/18/14  NA 136*  K 4.2  BUN 14  CREATININE 0.4*   No results for input(s): AST, ALT, ALKPHOS, BILITOT, PROT, ALBUMIN in the last 8760 hours.  Recent Labs  12/18/14  WBC 4.5  HGB 13.0  HCT 38  PLT 247   Lab Results  Component Value Date   TSH 2.21 12/18/2014   No results found for: HGBA1C Lab Results  Component Value Date   CHOL 138 03/28/2013   HDL 76* 03/28/2013   LDLCALC 39 03/28/2013   TRIG 113 03/28/2013    Significant Diagnostic Results in last 30 days:  No results found.  Assessment/Plan  1) Constipation -increase senokot s to 1 tab in the am and 2 tabs in the pm -dulcolax supp today  2) HTN -intermittently elevated  -continue apresoline  50 mg TID and clonidine prn -no aggressive RX given age/debility  3) PD -progressively worse with poor appetite and less interaction, no longer ambulatory -continue supportive care and sinemet per neurology  4) Afib -rate controlled  -only on aspirin due to falls in the past but now due to goals of care  5) Generalized anxiety disorder -now non  verbal so this was difficult to assess but she typically appears calm and has not required anxiety meds    Family/ staff Communication: discussed with caregiver, Juliann Pulse  Labs/tests ordered: NA

## 2015-10-28 ENCOUNTER — Encounter: Payer: Self-pay | Admitting: Adult Health

## 2015-10-28 ENCOUNTER — Non-Acute Institutional Stay (SKILLED_NURSING_FACILITY): Payer: Medicare Other | Admitting: Adult Health

## 2015-10-28 DIAGNOSIS — F0280 Dementia in other diseases classified elsewhere without behavioral disturbance: Secondary | ICD-10-CM

## 2015-10-28 DIAGNOSIS — G20 Parkinson's disease: Secondary | ICD-10-CM

## 2015-10-28 DIAGNOSIS — I10 Essential (primary) hypertension: Secondary | ICD-10-CM | POA: Diagnosis not present

## 2015-10-28 DIAGNOSIS — F411 Generalized anxiety disorder: Secondary | ICD-10-CM | POA: Diagnosis not present

## 2015-10-28 DIAGNOSIS — I4891 Unspecified atrial fibrillation: Secondary | ICD-10-CM | POA: Diagnosis not present

## 2015-10-28 DIAGNOSIS — K5901 Slow transit constipation: Secondary | ICD-10-CM

## 2015-10-28 DIAGNOSIS — G20A1 Parkinson's disease without dyskinesia, without mention of fluctuations: Secondary | ICD-10-CM

## 2015-10-28 NOTE — Progress Notes (Signed)
Patient ID: Vanessa Mcbride, female   DOB: 1924/11/25, 82 y.o.   MRN: [id number]  Location:   [facility]   Place of Service:  SNF (31) Provider:   [name], ANP [facility] [phone]  [name], [name], DO  Patient Care Team: [name], DO as PCP - General (Geriatric Medicine) [facility] Star Age, MD as Attending Physician (Neurology) [name], NP as Nurse Practitioner (Neurology)  Extended Emergency Contact Information Primary Emergency Contact: Derderian,[name] Address: [address]          [place], [place] [address] [name] of [place] Phone: [phone] Mobile Phone: [phone] Relation: Daughter  Code Status:  DNR Goals of care: Advanced Directive information Advanced Directives 10/07/2015  Does patient have an advance directive? Yes  Type of Paramedic of [place];Living will;Out of facility DNR (pink MOST or yellow form)  Does patient want to make changes to advanced directive? -  Copy of advanced directive(s) in chart? Yes  Pre-existing out of facility DNR order (yellow form or pink MOST form) -     Chief Complaint  Patient presents with  . Medical Management of Chronic Issues    HPI:  Pt is a 82 y.o. female seen today for medical management of chronic diseases.  Resides at [facility] in skilled care due to advanced PD. She continues to eat less over time and sleeps 18 hrs a day. She is non verbal and can not contribute to the hx. Staff reports that her BMs are regular after starting linzess and adding back senokot 2 tabs bid.  No reports of anxiety like symptoms. Sleeps well. Has afib but not on anticoagulation due to falls. Goals of care are comfort at this time with no hospitalizations. She has 24 hr caregivers and her daughter is very involved in her care.    Past Medical History:  Diagnosis Date  . Anxiety   . Aortic valve disorders 04/11/2012  . Arthritis    osteoarthritis  .  Atrial fibrillation ([place])   . Debility, unspecified 03/18/2012  . Dementia due to medical condition without behavioral disturbance 03/09/2012   04/07/12: ST evaluation revealed deficits in STM and visual spatial tasks. Some tasks not assessed due to language barrier. ST interventions continue.  05/10/2012:  Little progress with cognitive training;  patient always has someone with her to assist with problem solving.Mild, STML compensated with caregivers. Evaluation of functional status is difficult due to mobility limitation and dependence of caregiving staff.  06/06/12: No change.    . Dementia in conditions classified elsewhere without behavioral disturbance 04/07/2012  . Fracture closed, humerus 03/20/2012  . Generalized anxiety disorder   . Hyperlipidemia   . Hypertension   . Insomnia, unspecified 03/20/2012  . Memory loss 06/09/2012  . Osteoarthrosis, unspecified whether generalized or localized, unspecified site   . Other closed fractures of distal end of radius (alone) 03/20/2012  . Parkinson's disease ([place])   . Scoliosis   . 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 Unspecified constipation   . Urinary incontinence 07/05/2013   Re: PD   Past Surgical History:  Procedure Laterality Date  . BREAST SURGERY     benign lumpectomy  . CHOLECYSTECTOMY      Allergies  Allergen Reactions  . Iodine Anaphylaxis  . Iohexol      Desc: pt has had contrast in the past (years ago) and it causea anaphylaxis- per pt's daughter.  stephanie  davis,rtrct, Onset Date: [id number]       Medication List       Accurate as of 10/28/15  3:13 PM. Always use your most recent med list.          acetaminophen 500 MG tablet Commonly known as:  TYLENOL Take 500 mg by mouth. Take twice daily   aspirin 81 MG tablet Take 81 mg by mouth daily.   bisacodyl 10 MG suppository Commonly known as:  DULCOLAX Place 10 mg rectally. One suppository, rectal daily as  needed if no BM in two days   carbidopa-levodopa 25-100 MG tablet Commonly known as:  SINEMET IR Take 1 1/2 tablet 5 times a day, at 6 am, 10 am, 2 pm, 5:15 pm and bedtime.   cloNIDine 0.1 MG tablet Commonly known as:  CATAPRES Take 0.1 mg by mouth. Take one only if SBP 170 or greater; every 6 hours as needed   hydrALAZINE 50 MG tablet Commonly known as:  APRESOLINE 50 mg. Take one three times daily hold for systolic 99991111   lactose free nutrition Liqd Take 237 mLs by mouth. Once daily mid-afternoon   LINZESS 145 MCG Caps capsule Generic drug:  linaclotide Take 145 mcg by mouth daily before breakfast.   magnesium hydroxide 400 MG/5ML suspension Commonly known as:  MILK OF MAGNESIA Take 45 mLs by mouth daily as needed for mild constipation.   sennosides-docusate sodium 8.6-50 MG tablet Commonly known as:  SENOKOT-S Take 2 tablets by mouth 2 (two) times daily.       Review of Systems  Unable to perform ROS: Patient nonverbal    Immunization History  Administered Date(s) Administered  . Influenza Whole 12/22/2011, 01/03/2013  . Influenza-Unspecified 12/26/2013, 01/03/2015  . PPD Test 04/21/2012  . Pneumococcal Conjugate-13 07/10/2014   Pertinent  Health Maintenance Due  Topic Date Due  . PNA vac Low Risk Adult (2 of 2 - PPSV23) 07/10/2015  . INFLUENZA VACCINE  10/22/2015   Fall Risk  08/23/2015 01/10/2014  Falls in the past year? No No   Functional Status Survey:   Wt Readings from Last 3 Encounters:  10/28/15 141 lb 8 oz (64.2 kg)  10/07/15 145 lb 9.6 oz (66 kg)  08/23/15 144 lb 12.8 oz (65.7 kg)    Vitals:   10/28/15 1510  BP: (!) 142/72  Pulse: 88  Resp: 18  Temp: 98.4 F (36.9 C)  SpO2: 100%  Weight: 141 lb 8 oz (64.2 kg)   Body mass index is 26.74 kg/m. Physical Exam  Constitutional: No distress.  HENT:  Mouth/Throat: Oropharynx is clear and moist.  Eyes: Pupils are equal, round, and reactive to light.  Neck: No JVD present.  Cardiovascular:  Normal rate, normal heart sounds and intact distal pulses.  Exam reveals no friction rub.   No murmur heard. irregular  Pulmonary/Chest: Effort normal. No respiratory distress. She has wheezes (right upper lobe). She has no rales.  Abdominal: Soft. Bowel sounds are normal. She exhibits no distension and no mass.  Genitourinary: Vagina normal.  Musculoskeletal: She exhibits no edema.  Passive ROM with increased rigidity throughout all joints, but more so on the RUE  Lymphadenopathy:    She has no cervical adenopathy.  Neurological: She is alert.  Nonverbal, not able to f/c  Skin: Skin is warm and dry. No rash noted. She is not diaphoretic. No erythema.  Nursing note and vitals reviewed.   Labs reviewed:  Recent Labs  12/18/14  NA 136*  K 4.2  BUN  14  CREATININE 0.4*   No results for input(s): AST, ALT, ALKPHOS, BILITOT, PROT, ALBUMIN in the last 8760 hours.  Recent Labs  12/18/14  WBC 4.5  HGB 13.0  HCT 38  PLT 247   Lab Results  Component Value Date   TSH 2.21 12/18/2014   No results found for: HGBA1C Lab Results  Component Value Date   CHOL 138 03/28/2013   HDL 76 (A) 03/28/2013   LDLCALC 39 03/28/2013   TRIG 113 03/28/2013    Significant Diagnostic Results in last 30 days:  No results found.  Assessment/Plan  1. Slow transit constipation Controlled  Continue linzess and senokot  2. Atrial fibrillation with RVR (HCC) Rate controlled No anticoagulation due to falls and goals of care Continue aspirin 81 mg qd  3. Essential hypertension Controlled Continue hydralazine 50 mg TID and clonidine as needed  4. Parkinson disease ([place]) Advancing  Continue sinemet per neuro  5. Dementia due to medical condition without behavioral disturbance End stage Not on meds due to lack of benefit  6. Generalized anxiety disorder Stable no signs of agitation or anxiety    Family/ staff Communication: discussed with caregiver, Juliann Pulse  Labs/tests ordered:  NA

## 2015-12-10 ENCOUNTER — Non-Acute Institutional Stay (SKILLED_NURSING_FACILITY): Payer: Medicare Other | Admitting: Internal Medicine

## 2015-12-10 ENCOUNTER — Encounter: Payer: Self-pay | Admitting: Internal Medicine

## 2015-12-10 DIAGNOSIS — G20 Parkinson's disease: Secondary | ICD-10-CM | POA: Diagnosis not present

## 2015-12-10 DIAGNOSIS — G4701 Insomnia due to medical condition: Secondary | ICD-10-CM

## 2015-12-10 DIAGNOSIS — G20A1 Parkinson's disease without dyskinesia, without mention of fluctuations: Secondary | ICD-10-CM

## 2015-12-10 DIAGNOSIS — I482 Chronic atrial fibrillation, unspecified: Secondary | ICD-10-CM

## 2015-12-10 DIAGNOSIS — F0280 Dementia in other diseases classified elsewhere without behavioral disturbance: Secondary | ICD-10-CM

## 2015-12-10 DIAGNOSIS — N39498 Other specified urinary incontinence: Secondary | ICD-10-CM

## 2015-12-10 DIAGNOSIS — K5901 Slow transit constipation: Secondary | ICD-10-CM

## 2015-12-10 NOTE — Progress Notes (Signed)
Location:  [place] Room Number: 132 Place of Service:  SNF (31) Provider:  Kylin Dubs L. Mariea Clonts, D.O., C.M.D.  Hollace Kinnier, DO  Patient Care Team: [name], DO as PCP - General (Geriatric Medicine) [facility] Star Age, MD as Attending Physician (Neurology) [name], NP as Nurse Practitioner (Neurology)  Extended Emergency Contact Information Primary Emergency Contact: [facility] Address: [address]          [place], [place] [address] [name] of [place] Phone: [phone] Mobile Phone: [phone] Relation: Daughter  Code Status:  DNR Goals of care: Advanced Directive information Advanced Directives 12/10/2015  Does patient have an advance directive? Yes  Type of Advance Directive Out of facility DNR (pink MOST or yellow form);[place];Living will  Does patient want to make changes to advanced directive? -  Copy of advanced directive(s) in chart? Yes  Pre-existing out of facility DNR order (yellow form or pink MOST form) Yellow form placed in chart (order not valid for inpatient use);Pink MOST form placed in chart (order not valid for inpatient use)   Chief Complaint  Patient presents with  . Medical Management of Chronic Issues    routine visit    HPI:  Pt is a 82 y.o. female seen today for medical management of chronic diseases.  Vanessa Mcbride lives in SNF due to progressive Parkinson's disease with dementia, afib, htn, anxiety, constipation, generalized debility, TIA.    Her PD continues to decline--her intake is less and she spends much of her day asleep.  She has been nonverbal for at least 2 years now.    She had been having difficulty with constipation in intervals but it seems better since linzess was started.    In terms of comfort measures, she sleeps well, 24 hr caregivers assist her with all of her adls.    [name] has requested no hospitalizations and comfort care  per a MOST form.  Afib:  Was off anticoagulation due to falls.  Is essentially immobile and with comfort goals, anticoagulation does not make sense now.    HTN:  bp well controlled on current meds.    Past Medical History:  Diagnosis Date  . Anxiety   . Aortic valve disorders 04/11/2012  . Arthritis    osteoarthritis  . Atrial fibrillation ([place])   . Debility, unspecified 03/18/2012  . Dementia due to medical condition without behavioral disturbance 03/09/2012   04/07/12: ST evaluation revealed deficits in STM and visual spatial tasks. Some tasks not assessed due to language barrier. ST interventions continue.  05/10/2012:  Little progress with cognitive training;  patient always has someone with her to assist with problem solving.Mild, STML compensated with caregivers. Evaluation of functional status is difficult due to mobility limitation and dependence of caregiving staff.  06/06/12: No change.    . Dementia in conditions classified elsewhere without behavioral disturbance 04/07/2012  . Fracture closed, humerus 03/20/2012  . Generalized anxiety disorder   . Hyperlipidemia   . Hypertension   . Insomnia, unspecified 03/20/2012  . Memory loss 06/09/2012  . Osteoarthrosis, unspecified whether generalized or localized, unspecified site   . Other closed fractures of distal end of radius (alone) 03/20/2012  . Parkinson's disease ([place])   . Scoliosis   . 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 Unspecified constipation   . Urinary incontinence 07/05/2013   Re: PD   Past Surgical History:  Procedure Laterality Date  .  BREAST SURGERY     benign lumpectomy  . CHOLECYSTECTOMY      Allergies  Allergen Reactions  . Iodine Anaphylaxis  . Iohexol      Desc: pt has had contrast in the past (years ago) and it causea anaphylaxis- per pt's daughter.  stephanie davis,rtrct, Onset Date: [id number]       Medication List       Accurate as of 12/10/15  11:37 AM. Always use your most recent med list.          acetaminophen 500 MG tablet Commonly known as:  TYLENOL Take 500 mg by mouth. Take twice daily   aspirin 81 MG tablet Take 81 mg by mouth daily.   bisacodyl 10 MG suppository Commonly known as:  DULCOLAX Place 10 mg rectally. One suppository, rectal daily as needed if no BM in two days   carbidopa-levodopa 25-100 MG tablet Commonly known as:  SINEMET IR Take 1 1/2 tablet 5 times a day, at 6 am, 10 am, 2 pm, 5:15 pm and bedtime.   cloNIDine 0.1 MG tablet Commonly known as:  CATAPRES Take 0.1 mg by mouth. Take one only if SBP 170 or greater; every 6 hours as needed   hydrALAZINE 50 MG tablet Commonly known as:  APRESOLINE 50 mg. Take one three times daily hold for systolic 99991111   lactose free nutrition Liqd Take 237 mLs by mouth. Once daily mid-afternoon   LINZESS 145 MCG Caps capsule Generic drug:  linaclotide Take 145 mcg by mouth daily before breakfast.   magnesium hydroxide 400 MG/5ML suspension Commonly known as:  MILK OF MAGNESIA Take 45 mLs by mouth daily as needed for mild constipation.   sennosides-docusate sodium 8.6-50 MG tablet Commonly known as:  SENOKOT-S Take 2 tablets by mouth 2 (two) times daily.       Review of Systems  Reason unable to perform ROS: per nursing staff and caregivers, pt nonverbal.  Constitutional: Positive for fatigue. Negative for activity change, appetite change, fever and unexpected weight change.  HENT: Negative for congestion.   Eyes: Negative for visual disturbance.  Respiratory: Negative for chest tightness and shortness of breath.   Cardiovascular: Negative for leg swelling.  Gastrointestinal: Negative for abdominal distention, constipation, diarrhea, nausea and vomiting.  Genitourinary: Negative for frequency.       Incontinent  Musculoskeletal: Negative for arthralgias.  Skin: Positive for pallor.  Neurological: Positive for weakness.       Rigidity    Psychiatric/Behavioral: Positive for confusion. Negative for sleep disturbance.    Immunization History  Administered Date(s) Administered  . Influenza Whole 12/22/2011, 01/03/2013  . Influenza-Unspecified 12/26/2013, 01/03/2015  . PPD Test 04/21/2012  . Pneumococcal Conjugate-13 07/10/2014   Pertinent  Health Maintenance Due  Topic Date Due  . PNA vac Low Risk Adult (2 of 2 - PPSV23) 07/10/2015  . INFLUENZA VACCINE  10/22/2015   Fall Risk  08/23/2015 01/10/2014  Falls in the past year? No No   Functional Status Survey:    Vitals:   12/10/15 1129  BP: 130/70  Pulse: 80  Resp: 18  Temp: 97.6 F (36.4 C)  TempSrc: Oral  SpO2: 97%  Weight: 140 lb (63.5 kg)   Body mass index is 26.45 kg/m. Physical Exam  Constitutional: No distress.  Cardiovascular: Intact distal pulses.   irreg irreg  Pulmonary/Chest: Effort normal and breath sounds normal. No respiratory distress.  Abdominal: Soft. Bowel sounds are normal. She exhibits no distension. There is no tenderness.  Musculoskeletal:  Rigidity  of UEs  Neurological: She is alert.  Skin: Skin is warm and dry. There is pallor.  Psychiatric:  Masked facies    Labs reviewed:  Recent Labs  12/18/14  NA 136*  K 4.2  BUN 14  CREATININE 0.4*   No results for input(s): AST, ALT, ALKPHOS, BILITOT, PROT, ALBUMIN in the last 8760 hours.  Recent Labs  12/18/14  WBC 4.5  HGB 13.0  HCT 38  PLT 247   Lab Results  Component Value Date   TSH 2.21 12/18/2014   No results found for: HGBA1C Lab Results  Component Value Date   CHOL 138 03/28/2013   HDL 76 (A) 03/28/2013   LDLCALC 39 03/28/2013   TRIG 113 03/28/2013    Assessment/Plan 1. Parkinson disease ([place]) -advanced stage -cont 24x7 caregivers and full ADL assist in SNF -cont sinemet -cont comfort measures  2. Dementia due to medical condition without behavioral disturbance -hard to assess, pt has been nonverbal (mostly) for at least 2 years now -not on meds  for cognitive state -cont comfort measures with tylenol for pain mgt of arthropathy  3. Chronic atrial fibrillation (HCC) -not on meds for rate control or anticoagulation due to goals of care at this point  4. Other urinary incontinence -ongoing, cont appropriate pericare and keep resident clean and dry  5. Insomnia due to medical condition -no longer a problem for her, sleeps much of the day  6. Slow transit constipation -much better with linzess so cont it at 145 mcg plus senokot s 2 daily, mom prn, and prn dulcolax supps  Family/ staff Communication: discussed with SNF nursing  Labs/tests ordered:  None due to goals of care--comfort

## 2016-01-06 ENCOUNTER — Non-Acute Institutional Stay (SKILLED_NURSING_FACILITY): Payer: Medicare Other | Admitting: Adult Health

## 2016-01-06 ENCOUNTER — Encounter: Payer: Self-pay | Admitting: Adult Health

## 2016-01-06 DIAGNOSIS — R1310 Dysphagia, unspecified: Secondary | ICD-10-CM | POA: Insufficient documentation

## 2016-01-06 DIAGNOSIS — R1312 Dysphagia, oropharyngeal phase: Secondary | ICD-10-CM

## 2016-01-06 DIAGNOSIS — N39498 Other specified urinary incontinence: Secondary | ICD-10-CM

## 2016-01-06 DIAGNOSIS — K5901 Slow transit constipation: Secondary | ICD-10-CM

## 2016-01-06 DIAGNOSIS — G20 Parkinson's disease: Secondary | ICD-10-CM

## 2016-01-06 DIAGNOSIS — F0280 Dementia in other diseases classified elsewhere without behavioral disturbance: Secondary | ICD-10-CM

## 2016-01-06 DIAGNOSIS — I10 Essential (primary) hypertension: Secondary | ICD-10-CM | POA: Diagnosis not present

## 2016-01-06 DIAGNOSIS — G20A1 Parkinson's disease without dyskinesia, without mention of fluctuations: Secondary | ICD-10-CM

## 2016-01-06 NOTE — Progress Notes (Signed)
Patient ID: Vanessa Mcbride, female   DOB: 06-21-1924, 82 y.o.   MRN: [id number]  Location:   [facility]   Place of Service:  SNF (31) Provider:   [name], ANP [facility] [phone]  [name], [name], DO  Patient Care Team: [name], DO as PCP - General (Geriatric Medicine) [facility] Star Age, MD as Attending Physician (Neurology) [name], NP as Nurse Practitioner (Neurology)  Extended Emergency Contact Information Primary Emergency Contact: Yapp,[name] Address: [address]          [place], [place] [address] [name] of [place] Phone: [phone] Mobile Phone: [phone] Relation: Daughter  Code Status:  DNR Goals of care: Advanced Directive information Advanced Directives 01/06/2016  Does patient have an advance directive? Yes  Type of Advance Directive Out of facility DNR (pink MOST or yellow form);[place];Living will  Does patient want to make changes to advanced directive? -  Copy of advanced directive(s) in chart? Yes  Pre-existing out of facility DNR order (yellow form or pink MOST form) Yellow form placed in chart (order not valid for inpatient use);Pink MOST form placed in chart (order not valid for inpatient use)     Chief Complaint  Patient presents with  . Medical Management of Chronic Issues    HPI:  Pt is a 82 y.o. female seen today for medical management of chronic diseases.  Resides at [facility] in skilled care due to advanced PD.  She is non verbal and can not contribute to the hx..  No reports of anxiety like symptoms. Has afib but not on anticoagulation due to falls. Goals of care are comfort at this time with no hospitalizations. She has 24 hr caregivers. Currently on puree HTL with periods of coughing at meals.  No bouts of pna Remains more rigid on the right than the left. Currently on sinemet 5 x day. Continue to sleep much of the day but more wakeful in the evening.     Past Medical History:  Diagnosis Date  . Anxiety   . Aortic valve disorders 04/11/2012  . Arthritis    osteoarthritis  . Atrial fibrillation ([place])   . Debility, unspecified 03/18/2012  . Dementia due to medical condition without behavioral disturbance 03/09/2012   04/07/12: ST evaluation revealed deficits in STM and visual spatial tasks. Some tasks not assessed due to language barrier. ST interventions continue.  05/10/2012:  Little progress with cognitive training;  patient always has someone with her to assist with problem solving.Mild, STML compensated with caregivers. Evaluation of functional status is difficult due to mobility limitation and dependence of caregiving staff.  06/06/12: No change.    . Dementia in conditions classified elsewhere without behavioral disturbance 04/07/2012  . Fracture closed, humerus 03/20/2012  . Generalized anxiety disorder   . Hyperlipidemia   . Hypertension   . Insomnia, unspecified 03/20/2012  . Memory loss 06/09/2012  . Osteoarthrosis, unspecified whether generalized or localized, unspecified site   . Other closed fractures of distal end of radius (alone) 03/20/2012  . Parkinson's disease ([place])   . Scoliosis   . Transient ischemic attack (TIA), and cerebral infarction without residual deficits(V12.54)   . Unspecified constipation   . Urinary incontinence 07/05/2013   Re: PD   Past Surgical History:  Procedure Laterality Date  . BREAST SURGERY     benign lumpectomy  . CHOLECYSTECTOMY      Allergies  Allergen Reactions  . Iodine Anaphylaxis  . Iohexol  Desc: pt has had contrast in the past (years ago) and it causea anaphylaxis- per pt's daughter.  stephanie davis,rtrct, Onset Date: [id number]       Medication List       Accurate as of 01/06/16  3:48 PM. Always use your most recent med list.          acetaminophen 500 MG tablet Commonly known as:  TYLENOL Take 500 mg by mouth. Take twice daily   aspirin 81 MG tablet Take 81 mg  by mouth daily.   bisacodyl 10 MG suppository Commonly known as:  DULCOLAX Place 10 mg rectally. One suppository, rectal daily as needed if no BM in two days   carbidopa-levodopa 25-100 MG tablet Commonly known as:  SINEMET IR Take 1 1/2 tablet 5 times a day, at 6 am, 10 am, 2 pm, 5:15 pm and bedtime.   cloNIDine 0.1 MG tablet Commonly known as:  CATAPRES Take 0.1 mg by mouth. Take one only if SBP 170 or greater; every 6 hours as needed   hydrALAZINE 50 MG tablet Commonly known as:  APRESOLINE 50 mg. Take one three times daily hold for systolic 99991111   lactose free nutrition Liqd Take 237 mLs by mouth. Once daily mid-afternoon   LINZESS 145 MCG Caps capsule Generic drug:  linaclotide Take 145 mcg by mouth daily before breakfast.   magnesium hydroxide 400 MG/5ML suspension Commonly known as:  MILK OF MAGNESIA Take 45 mLs by mouth daily as needed for mild constipation.   sennosides-docusate sodium 8.6-50 MG tablet Commonly known as:  SENOKOT-S Take 2 tablets by mouth daily.       Review of Systems  Unable to perform ROS: Patient nonverbal    Immunization History  Administered Date(s) Administered  . Influenza Whole 12/22/2011, 01/03/2013  . Influenza-Unspecified 12/26/2013, 01/03/2015  . PPD Test 04/21/2012  . Pneumococcal Conjugate-13 07/10/2014   Pertinent  Health Maintenance Due  Topic Date Due  . PNA vac Low Risk Adult (2 of 2 - PPSV23) 07/10/2015  . INFLUENZA VACCINE  10/22/2015   Fall Risk  01/06/2016 08/23/2015 01/10/2014  Falls in the past year? No No No   Functional Status Survey:   Wt Readings from Last 3 Encounters:  01/06/16 135 lb 6.4 oz (61.4 kg)  12/10/15 140 lb (63.5 kg)  10/28/15 141 lb 8 oz (64.2 kg)    Vitals:   01/06/16 1532  BP: 140/70  Pulse: 81  Resp: 20  Temp: 97.6 F (36.4 C)  SpO2: 97%  Weight: 135 lb 6.4 oz (61.4 kg)   Body mass index is 25.58 kg/m. Physical Exam  Constitutional: No distress.  HENT:  Mouth/Throat:  Oropharynx is clear and moist.  Eyes: Pupils are equal, round, and reactive to light.  Neck: No JVD present.  Cardiovascular: Normal rate, normal heart sounds and intact distal pulses.  Exam reveals no friction rub.   No murmur heard. irregular  Pulmonary/Chest: Effort normal. No respiratory distress. She has no wheezes. She has no rales.  clear  Abdominal: Soft. Bowel sounds are normal. She exhibits no distension and no mass.  Genitourinary: Vagina normal.  Musculoskeletal: She exhibits no edema.  Passive ROM with increased rigidity throughout all joints, but more so on the RUE  Lymphadenopathy:    She has no cervical adenopathy.  Neurological: She is alert.  Nonverbal, not able to f/c  Skin: Skin is warm and dry. No rash noted. She is not diaphoretic. No erythema.  Nursing note and vitals reviewed.  Labs reviewed: No results for input(s): NA, K, CL, CO2, GLUCOSE, BUN, CREATININE, CALCIUM, MG, PHOS in the last 8760 hours. No results for input(s): AST, ALT, ALKPHOS, BILITOT, PROT, ALBUMIN in the last 8760 hours. No results for input(s): WBC, NEUTROABS, HGB, HCT, MCV, PLT in the last 8760 hours. Lab Results  Component Value Date   TSH 2.21 12/18/2014   No results found for: HGBA1C Lab Results  Component Value Date   CHOL 138 03/28/2013   HDL 76 (A) 03/28/2013   LDLCALC 39 03/28/2013   TRIG 113 03/28/2013    Significant Diagnostic Results in last 30 days:  No results found.  Assessment/Plan 1. Essential hypertension Controlled Continue Hydralazine 50 mg TID  2. Slow transit constipation Controlled Continue linzess 145 mcg qd and senna s 2 qd  3. Parkinson disease ([place]) Continue current dose of sinemet Consider tapering   4. Dementia due to medical condition without behavioral disturbance end stage, dependent for all ADLs  5. Other urinary incontinence Periods of low output due to poor intake Incontinence care provided No urinary symptoms  6. Oropharyngeal  dysphagia Continue modified diet Asp prec    Family/ staff Communication: discussed with caregiver, Juliann Pulse  Labs/tests ordered: NA

## 2016-01-09 DIAGNOSIS — Z23 Encounter for immunization: Secondary | ICD-10-CM | POA: Diagnosis not present

## 2016-01-23 ENCOUNTER — Non-Acute Institutional Stay (SKILLED_NURSING_FACILITY): Payer: Medicare Other | Admitting: Adult Health

## 2016-01-23 DIAGNOSIS — R627 Adult failure to thrive: Secondary | ICD-10-CM

## 2016-01-23 DIAGNOSIS — Z7189 Other specified counseling: Secondary | ICD-10-CM

## 2016-01-23 NOTE — Progress Notes (Signed)
Location:  Occupational psychologist of Service:  SNF (31) Provider:   [name], ANP [place] [phone]   [name], [name], DO  Patient Care Team: [name], DO as PCP - General (Geriatric Medicine) [facility] Star Age, MD as Attending Physician (Neurology) [name], NP as Nurse Practitioner (Neurology)  Extended Emergency Contact Information Primary Emergency Contact: Taniguchi,[name] Address: [address]          [place], [place] [address] [name] of [place] Phone: [phone] Mobile Phone: [phone] Relation: Daughter  Code Status:  DNR Goals of care: Advanced Directive information Advanced Directives 01/06/2016  Does patient have an advance directive? Yes  Type of Advance Directive Out of facility DNR (pink MOST or yellow form);[place];Living will  Does patient want to make changes to advanced directive? -  Copy of advanced directive(s) in chart? Yes  Pre-existing out of facility DNR order (yellow form or pink MOST form) Yellow form placed in chart (order not valid for inpatient use);Pink MOST form placed in chart (order not valid for inpatient use)     Chief Complaint  Patient presents with  . Acute Visit    ?seizure    HPI:  Pt is a 82 y.o. female seen today for an acute visit for a questionable seizure. She has advanced dementia and PD.  She is non ambulatory and non verbal. She has been coughing with meals and eating less over time. Her weight has decreased by 4 lbs over the past two months. Her daughter is a physician and at the bedside. She describes an episode yesterday where Ms. Deems had 20 sec periods of apnea, as well as a tightening of her facial muscles. She reports that Ms. [place] jaw dislocated and then the muscles were massaged and the jaw was replaced. The caregiver and POA helped with this matter and is no longer an issue.  She was very lethargic yesterday and  appeared to be close to death.  Today she is more alert and is able to eat.  She remains on Sinemet. The neurologist stated that we should continue this med despite her poor prognosis. She is on a puree diet with HTL and has 24 caregivers to monitor her and feed her.  Her daughter is requesting roxanol in case she takes another turn for the worse.   Past Medical History:  Diagnosis Date  . Anxiety   . Aortic valve disorders 04/11/2012  . Arthritis    osteoarthritis  . Atrial fibrillation ([place])   . Debility, unspecified 03/18/2012  . Dementia due to medical condition without behavioral disturbance 03/09/2012   04/07/12: ST evaluation revealed deficits in STM and visual spatial tasks. Some tasks not assessed due to language barrier. ST interventions continue.  05/10/2012:  Little progress with cognitive training;  patient always has someone with her to assist with problem solving.Mild, STML compensated with caregivers. Evaluation of functional status is difficult due to mobility limitation and dependence of caregiving staff.  06/06/12: No change.    . Dementia in conditions classified elsewhere without behavioral disturbance 04/07/2012  . Fracture closed, humerus 03/20/2012  . Generalized anxiety disorder   . Hyperlipidemia   . Hypertension   . Insomnia, unspecified 03/20/2012  . Memory loss 06/09/2012  . Osteoarthrosis, unspecified whether generalized or localized, unspecified site   . Other closed fractures of distal end of radius (alone) 03/20/2012  . Parkinson's disease ([place])   . Scoliosis   .  Transient ischemic attack (TIA), and cerebral infarction without residual deficits(V12.54)   . Unspecified constipation   . Urinary incontinence 07/05/2013   Re: PD   Past Surgical History:  Procedure Laterality Date  . BREAST SURGERY     benign lumpectomy  . CHOLECYSTECTOMY      Allergies  Allergen Reactions  . Iodine Anaphylaxis  . Iohexol      Desc: pt has had contrast in the past (years  ago) and it causea anaphylaxis- per pt's daughter.  stephanie davis,rtrct, Onset Date: [id number]       Medication List       Accurate as of 01/23/16 11:59 PM. Always use your most recent med list.          acetaminophen 500 MG tablet Commonly known as:  TYLENOL Take 500 mg by mouth. Take twice daily   aspirin 81 MG tablet Take 81 mg by mouth daily.   bisacodyl 10 MG suppository Commonly known as:  DULCOLAX Place 10 mg rectally. One suppository, rectal daily as needed if no BM in two days   carbidopa-levodopa 25-100 MG tablet Commonly known as:  SINEMET IR Take 1 1/2 tablet 5 times a day, at 6 am, 10 am, 2 pm, 5:15 pm and bedtime.   cloNIDine 0.1 MG tablet Commonly known as:  CATAPRES Take 0.1 mg by mouth. Take one only if SBP 170 or greater; every 6 hours as needed   hydrALAZINE 50 MG tablet Commonly known as:  APRESOLINE 50 mg. Take one three times daily hold for systolic 99991111   lactose free nutrition Liqd Take 237 mLs by mouth. Once daily mid-afternoon   LINZESS 145 MCG Caps capsule Generic drug:  linaclotide Take 145 mcg by mouth daily before breakfast.   magnesium hydroxide 400 MG/5ML suspension Commonly known as:  MILK OF MAGNESIA Take 45 mLs by mouth daily as needed for mild constipation.   sennosides-docusate sodium 8.6-50 MG tablet Commonly known as:  SENOKOT-S Take 2 tablets by mouth daily.       Review of Systems  Unable to perform ROS: Dementia    Immunization History  Administered Date(s) Administered  . Influenza Inj Mdck Quad Pf 01/09/2016  . Influenza Whole 12/22/2011, 01/03/2013  . Influenza-Unspecified 12/26/2013, 01/03/2015  . PPD Test 04/21/2012  . Pneumococcal Conjugate-13 07/10/2014   Pertinent  Health Maintenance Due  Topic Date Due  . PNA vac Low Risk Adult (2 of 2 - PPSV23) 07/10/2015  . INFLUENZA VACCINE  Completed   Fall Risk  01/06/2016 08/23/2015 01/10/2014  Falls in the past year? No No No   Functional Status Survey:     Vitals:   01/28/16 1220  BP: (!) 160/88  Pulse: 74  Resp: 20  Temp: 97.8 F (36.6 C)  SpO2: 96%  Weight: 136 lb (61.7 kg)   There is no height or weight on file to calculate BMI.  Wt Readings from Last 3 Encounters:  01/28/16 136 lb (61.7 kg)  01/06/16 135 lb 6.4 oz (61.4 kg)  12/10/15 140 lb (63.5 kg)   Physical Exam  Constitutional: No distress.  HENT:  Head: Normocephalic and atraumatic.  Eyes: Pupils are equal, round, and reactive to light.  Pulmonary/Chest: Effort normal and breath sounds normal.  Abdominal: Soft. Bowel sounds are normal.  Musculoskeletal:  Rigidity to all ext, worse on the right  Neurological: She is alert.  Non verbal, not able to f/c.  Tracks with eyes. No obvious focal def.  Skin: Skin is warm and dry. She  is not diaphoretic.    Labs reviewed: No results for input(s): NA, K, CL, CO2, GLUCOSE, BUN, CREATININE, CALCIUM, MG, PHOS in the last 8760 hours. No results for input(s): AST, ALT, ALKPHOS, BILITOT, PROT, ALBUMIN in the last 8760 hours. No results for input(s): WBC, NEUTROABS, HGB, HCT, MCV, PLT in the last 8760 hours. Lab Results  Component Value Date   TSH 2.21 12/18/2014   No results found for: HGBA1C Lab Results  Component Value Date   CHOL 138 03/28/2013   HDL 76 (A) 03/28/2013   LDLCALC 39 03/28/2013   TRIG 113 03/28/2013    Significant Diagnostic Results in last 30 days:  No results found.  Assessment/Plan 1. FTT (failure to thrive) in adult Losing weight in the setting of advanced PD and dementia May use roxanol 5 mg q 4 hrs prn pain or sob It is unclear what happened the day prior. I offered valium supp in the event it was seizure so this would be available if the situation occurred again but her daughter declined.  Continue current diet and oral supplements.  2. Advanced care planning/counseling discussion We reviewed her most form. She is a DNR with no hospitalizations and they do not want IVF or tube feeding. We  will determine the use of antibiotics if appropriate.  Her goals of care are comfort. Will continue to monitor and support her family/caregivers as necessary.      Family/ staff Communication: discussed with Alen Bleacher  Labs/tests ordered:  Family does not want labs

## 2016-01-28 ENCOUNTER — Encounter: Payer: Self-pay | Admitting: Adult Health

## 2016-02-21 ENCOUNTER — Non-Acute Institutional Stay (SKILLED_NURSING_FACILITY): Payer: Medicare Other | Admitting: Adult Health

## 2016-02-21 DIAGNOSIS — F0280 Dementia in other diseases classified elsewhere without behavioral disturbance: Secondary | ICD-10-CM | POA: Diagnosis not present

## 2016-02-21 DIAGNOSIS — I10 Essential (primary) hypertension: Secondary | ICD-10-CM | POA: Diagnosis not present

## 2016-02-21 DIAGNOSIS — K5901 Slow transit constipation: Secondary | ICD-10-CM

## 2016-02-21 DIAGNOSIS — G20 Parkinson's disease: Secondary | ICD-10-CM | POA: Diagnosis not present

## 2016-02-21 DIAGNOSIS — R569 Unspecified convulsions: Secondary | ICD-10-CM | POA: Diagnosis not present

## 2016-02-26 ENCOUNTER — Encounter: Payer: Self-pay | Admitting: Adult Health

## 2016-02-26 DIAGNOSIS — R569 Unspecified convulsions: Secondary | ICD-10-CM | POA: Insufficient documentation

## 2016-02-26 NOTE — Progress Notes (Signed)
Location:  Occupational psychologist of Service:  SNF (31) Provider:   [name], ANP [place] [phone]   [name], [name], DO  Patient Care Team: [name], DO as PCP - General (Geriatric Medicine) [facility] Star Age, MD as Attending Physician (Neurology) [name], NP as Nurse Practitioner (Neurology)  Extended Emergency Contact Information Primary Emergency Contact: Mesenbrink,[name] Address: [address]          [place], [place] [address] [name] of [place] Phone: [phone] Mobile Phone: [phone] Relation: Daughter  Code Status:  DNR Goals of care: Advanced Directive information Advanced Directives 02/26/2016  Does Patient Have a Medical Advance Directive? Yes  Type of Advance Directive Out of facility DNR (pink MOST or yellow form);[place];Living will  Does patient want to make changes to medical advance directive? -  Copy of [place] in Chart? Yes  Pre-existing out of facility DNR order (yellow form or pink MOST form) -     Chief Complaint  Patient presents with  . Medical Management of Chronic Issues    HPI:  82 y.o. female residing at [facility]. I am here to review her chronic medical issues. She has advanced PD and dementia. Functionally she is not ambulatory and requires assistance for all ADL's.  She is non verbal and not able to participate in the interview but is alert and tracks with her eyes.  She has caregivers 24/7 and it has been reported that 1 day ago she had some shaking that lasted about 15 min and subsided and she was also rigid with her eyes closed during the episode.  This has occurred in the month of Nov. As well and there is concern for seizure activity.   Generally she has declined and is eating less and more sleepy throughout the day. Weight is down two lbs from last month.   BP has been elevated x 1 in  the past two weeks requiring clonidine. Roxanol prn has been ordered for sob/pain but has not been used.   She has chronic constipation and is currently on linzess and senokot s but still has required prn's to help her bowels move.  The staff has a hard time getting her to take pills in the am due to dysphagia and lethargy.       Past Medical History:  Diagnosis Date  . Anxiety   . Aortic valve disorders 04/11/2012  . Arthritis    osteoarthritis  . Atrial fibrillation ([place])   . Debility, unspecified 03/18/2012  . Dementia due to medical condition without behavioral disturbance 03/09/2012   04/07/12: ST evaluation revealed deficits in STM and visual spatial tasks. Some tasks not assessed due to language barrier. ST interventions continue.  05/10/2012:  Little progress with cognitive training;  patient always has someone with her to assist with problem solving.Mild, STML compensated with caregivers. Evaluation of functional status is difficult due to mobility limitation and dependence of caregiving staff.  06/06/12: No change.    . Dementia in conditions classified elsewhere without behavioral disturbance 04/07/2012  . Fracture closed, humerus 03/20/2012  . Generalized anxiety disorder   . Hyperlipidemia   . Hypertension   . Insomnia, unspecified 03/20/2012  . Memory loss 06/09/2012  . Osteoarthrosis, unspecified whether generalized or localized, unspecified site   . Other closed fractures of distal end of radius (alone) 03/20/2012  . Parkinson's disease ([place])   . Scoliosis   .  Transient ischemic attack (TIA), and cerebral infarction without residual deficits(V12.54)   . Unspecified constipation   . Urinary incontinence 07/05/2013   Re: PD   Past Surgical History:  Procedure Laterality Date  . BREAST SURGERY     benign lumpectomy  . CHOLECYSTECTOMY      Allergies  Allergen Reactions  . Iodine Anaphylaxis  . Iohexol      Desc: pt has had contrast in the past (years ago) and it  causea anaphylaxis- per pt's daughter.  stephanie davis,rtrct, Onset Date: [id number]       Medication List       Accurate as of 02/21/16 11:59 PM. Always use your most recent med list.          acetaminophen 500 MG tablet Commonly known as:  TYLENOL Take 500 mg by mouth. Take twice daily   aspirin 81 MG tablet Take 81 mg by mouth daily.   bisacodyl 10 MG suppository Commonly known as:  DULCOLAX Place 10 mg rectally. One suppository, rectal daily as needed if no BM in two days   carbidopa-levodopa 25-100 MG tablet Commonly known as:  SINEMET IR Take 1 1/2 tablet 5 times a day, at 6 am, 10 am, 2 pm, 5:15 pm and bedtime.   cloNIDine 0.1 MG tablet Commonly known as:  CATAPRES Take 0.1 mg by mouth. Take one only if SBP 170 or greater; every 6 hours as needed   hydrALAZINE 50 MG tablet Commonly known as:  APRESOLINE 50 mg. Take one three times daily hold for systolic 99991111   lactose free nutrition Liqd Take 237 mLs by mouth. Once daily mid-afternoon   LINZESS 145 MCG Caps capsule Generic drug:  linaclotide Take 145 mcg by mouth daily before breakfast.   magnesium hydroxide 400 MG/5ML suspension Commonly known as:  MILK OF MAGNESIA Take 45 mLs by mouth daily as needed for mild constipation.   sennosides-docusate sodium 8.6-50 MG tablet Commonly known as:  SENOKOT-S Take 2 tablets by mouth daily.       Review of Systems  Unable to perform ROS: Dementia    Immunization History  Administered Date(s) Administered  . Influenza Inj Mdck Quad Pf 01/09/2016  . Influenza Whole 12/22/2011, 01/03/2013  . Influenza-Unspecified 12/26/2013, 01/03/2015  . PPD Test 04/21/2012  . Pneumococcal Conjugate-13 07/10/2014   Pertinent  Health Maintenance Due  Topic Date Due  . PNA vac Low Risk Adult (2 of 2 - PPSV23) 07/10/2015  . INFLUENZA VACCINE  Completed   Fall Risk  01/06/2016 08/23/2015 01/10/2014  Falls in the past year? No No No   Functional Status Survey:    Vitals:    02/26/16 0818  BP: (!) 154/94  Pulse: 65  Resp: 20  Temp: 98 F (36.7 C)  Weight: 134 lb 14.4 oz (61.2 kg)   Body mass index is 25.49 kg/m.  Wt Readings from Last 3 Encounters:  02/26/16 134 lb 14.4 oz (61.2 kg)  01/28/16 136 lb (61.7 kg)  01/06/16 135 lb 6.4 oz (61.4 kg)   Physical Exam  Constitutional: No distress.  HENT:  Head: Normocephalic and atraumatic.  Eyes: Pupils are equal, round, and reactive to light.  Cardiovascular: Normal rate and regular rhythm.   No murmur heard. Pulmonary/Chest: Effort normal and breath sounds normal.  Abdominal: Soft. Bowel sounds are normal. She exhibits no distension. There is no tenderness.  Musculoskeletal:  Rigidity to all ext, worse on the right  Neurological: She is alert.  Non verbal, not able to f/c.  Tracks with eyes. No obvious focal def.  Skin: Skin is warm and dry. She is not diaphoretic.  Psychiatric: She has a normal mood and affect.  Nursing note and vitals reviewed.   Labs reviewed: No results for input(s): NA, K, CL, CO2, GLUCOSE, BUN, CREATININE, CALCIUM, MG, PHOS in the last 8760 hours. No results for input(s): AST, ALT, ALKPHOS, BILITOT, PROT, ALBUMIN in the last 8760 hours. No results for input(s): WBC, NEUTROABS, HGB, HCT, MCV, PLT in the last 8760 hours. Lab Results  Component Value Date   TSH 2.21 12/18/2014   No results found for: HGBA1C Lab Results  Component Value Date   CHOL 138 03/28/2013   HDL 76 (A) 03/28/2013   LDLCALC 39 03/28/2013   TRIG 113 03/28/2013    Significant Diagnostic Results in last 30 days:  No results found.  Assessment/Plan  1. Seizures ([place])???? Infrequent difficulty taking pills Goals of care are comfort based May use PR valium as need for seizure activity  2. Parkinson disease ([place]) Severe Continues on sinemet per Neuro with little benefit  3. Slow transit constipation Still has periods of constipation but due to her difficulty swallowing pills/miralax would  just continue her current regimen and supplement with MOM or dulcolax as needed  4. Essential hypertension Controlled Continue Hydralazine 50 mg TID and prn clonidine  5. Dementia due to medical condition without behavioral disturbance Severe, non verbal Continue supportive care Would not benefit from meds for memory   Family/ staff Communication: discussed with caregiver Juliann Pulse and staff  Labs/tests ordered:  Family does not want labs

## 2016-03-30 ENCOUNTER — Encounter: Payer: Self-pay | Admitting: Adult Health

## 2016-03-30 ENCOUNTER — Non-Acute Institutional Stay (SKILLED_NURSING_FACILITY): Payer: Medicare Other | Admitting: Adult Health

## 2016-03-30 DIAGNOSIS — G20 Parkinson's disease: Secondary | ICD-10-CM

## 2016-03-30 DIAGNOSIS — K5901 Slow transit constipation: Secondary | ICD-10-CM

## 2016-03-30 DIAGNOSIS — R1312 Dysphagia, oropharyngeal phase: Secondary | ICD-10-CM

## 2016-03-30 DIAGNOSIS — R569 Unspecified convulsions: Secondary | ICD-10-CM | POA: Diagnosis not present

## 2016-03-30 DIAGNOSIS — M26609 Unspecified temporomandibular joint disorder, unspecified side: Secondary | ICD-10-CM | POA: Diagnosis not present

## 2016-03-30 DIAGNOSIS — F0280 Dementia in other diseases classified elsewhere without behavioral disturbance: Secondary | ICD-10-CM

## 2016-03-30 NOTE — Progress Notes (Signed)
Location:  Occupational psychologist of Service:  SNF (31) Provider:   [name], ANP [place] [phone]   [name], [name], DO  Patient Care Team: [name], DO as PCP - General (Geriatric Medicine) [facility] Star Age, MD as Attending Physician (Neurology) [name], NP as Nurse Practitioner (Neurology)  Extended Emergency Contact Information Primary Emergency Contact: Rizzi,[name] Address: [address]          [place], [place] [address] [name] of [place] Phone: [phone] Mobile Phone: [phone] Relation: Daughter  Code Status:  DNR Goals of care: Advanced Directive information Advanced Directives 02/26/2016  Does Patient Have a Medical Advance Directive? Yes  Type of Advance Directive Out of facility DNR (pink MOST or yellow form);[place];Living will  Does patient want to make changes to medical advance directive? -  Copy of [place] in Chart? Yes  Pre-existing out of facility DNR order (yellow form or pink MOST form) -     Chief Complaint  Patient presents with  . Medical Management of Chronic Issues    HPI:  83 y.o. female residing at [facility]. I am here to review her chronic medical issues. She has advanced PD and dementia. Functionally she is not ambulatory and requires assistance for all ADL's.  She is non verbal and not able to follow commands. She has 24/7 caregivers. She is sleeping more and has less p.o. Intake over time. Her weight remains stable at 136 lbs.  She no longer receives labs routinely due to goals of care. Her caretaker indicates she is choking more on her food and coughing with liquids. (she is on pudding thick liquids).  Most form indicates no feeding tubes. There are also reports that her jaw is popping out of place at times and she has difficulty closing her mouth. Also the muscles around her face seem  to tighten and twitch at times.  She sometimes coughs even as she tries to swallow her own saliva.  She has not had a fever or resp infection.  Bowel are moving well per caregiver.   No signs of pain or distress except during the choking episodes Has not needed prn roxanol Hx of seizure like activity, but none noted in the past month.     Past Medical History:  Diagnosis Date  . Anxiety   . Aortic valve disorders 04/11/2012  . Arthritis    osteoarthritis  . Atrial fibrillation ([place])   . Debility, unspecified 03/18/2012  . Dementia due to medical condition without behavioral disturbance 03/09/2012   04/07/12: ST evaluation revealed deficits in STM and visual spatial tasks. Some tasks not assessed due to language barrier. ST interventions continue.  05/10/2012:  Little progress with cognitive training;  patient always has someone with her to assist with problem solving.Mild, STML compensated with caregivers. Evaluation of functional status is difficult due to mobility limitation and dependence of caregiving staff.  06/06/12: No change.    . Dementia in conditions classified elsewhere without behavioral disturbance 04/07/2012  . Fracture closed, humerus 03/20/2012  . Generalized anxiety disorder   . Hyperlipidemia   . Hypertension   . Insomnia, unspecified 03/20/2012  . Memory loss 06/09/2012  . Osteoarthrosis, unspecified whether generalized or localized, unspecified site   . Other closed fractures of distal end of radius (alone) 03/20/2012  . Parkinson's disease ([place])   . Scoliosis   . Transient ischemic attack (TIA), and cerebral  infarction without residual deficits(V12.54)   . Unspecified constipation   . Urinary incontinence 07/05/2013   Re: PD   Past Surgical History:  Procedure Laterality Date  . BREAST SURGERY     benign lumpectomy  . CHOLECYSTECTOMY      Allergies  Allergen Reactions  . Iodine Anaphylaxis  . Iohexol      Desc: pt has had contrast in the past (years ago)  and it causea anaphylaxis- per pt's daughter.  stephanie davis,rtrct, Onset Date: [id number]     Allergies as of 03/30/2016      Reactions   Iodine Anaphylaxis   Iohexol     Desc: pt has had contrast in the past (years ago) and it causea anaphylaxis- per pt's daughter.  stephanie davis,rtrct, Onset Date: [id number]      Medication List       Accurate as of 03/30/16 11:03 AM. Always use your most recent med list.          acetaminophen 500 MG tablet Commonly known as:  TYLENOL Take 500 mg by mouth. Take twice daily   aspirin 81 MG tablet Take 81 mg by mouth daily.   bisacodyl 10 MG suppository Commonly known as:  DULCOLAX Place 10 mg rectally. One suppository, rectal daily as needed if no BM in two days   carbidopa-levodopa 25-100 MG tablet Commonly known as:  SINEMET IR Take 1 1/2 tablet 5 times a day, at 6 am, 10 am, 2 pm, 5:15 pm and bedtime.   cloNIDine 0.1 MG tablet Commonly known as:  CATAPRES Take 0.1 mg by mouth. Take one only if SBP 170 or greater; every 6 hours as needed   hydrALAZINE 50 MG tablet Commonly known as:  APRESOLINE 50 mg. Take one three times daily hold for systolic 99991111   lactose free nutrition Liqd Take 237 mLs by mouth. Once daily mid-afternoon   LINZESS 145 MCG Caps capsule Generic drug:  linaclotide Take 145 mcg by mouth daily before breakfast.   magnesium hydroxide 400 MG/5ML suspension Commonly known as:  MILK OF MAGNESIA Take 45 mLs by mouth daily as needed for mild constipation.   sennosides-docusate sodium 8.6-50 MG tablet Commonly known as:  SENOKOT-S Take 2 tablets by mouth daily.       Review of Systems  Unable to perform ROS: Dementia    Immunization History  Administered Date(s) Administered  . Influenza Inj Mdck Quad Pf 01/09/2016  . Influenza Whole 12/22/2011, 01/03/2013  . Influenza-Unspecified 12/26/2013, 01/03/2015  . PPD Test 04/21/2012  . Pneumococcal Conjugate-13 07/10/2014   Pertinent  Health Maintenance Due   Topic Date Due  . PNA vac Low Risk Adult (2 of 2 - PPSV23) 07/10/2015  . INFLUENZA VACCINE  Completed   Fall Risk  01/06/2016 08/23/2015 01/10/2014  Falls in the past year? No No No   Functional Status Survey:    Vitals:   03/30/16 1100  BP: 120/74  Pulse: 72  Resp: 18  Temp: 97.8 F (36.6 C)  SpO2: 96%  Weight: 136 lb (61.7 kg)   Body mass index is 25.7 kg/m.  Wt Readings from Last 3 Encounters:  03/30/16 136 lb (61.7 kg)  02/26/16 134 lb 14.4 oz (61.2 kg)  01/28/16 136 lb (61.7 kg)   Physical Exam  Constitutional: No distress.  HENT:  Head: Normocephalic and atraumatic.  Right Ear: External ear normal.  Left Ear: External ear normal.  Nose: Nose normal.  Mouth/Throat: Oropharynx is clear and moist. No oropharyngeal exudate.  Jaw is  not tender and is in proper alignment. Her mouth does stay open for the exam but mucous membranes are moist  Eyes: Pupils are equal, round, and reactive to light.  Cardiovascular: Normal rate and regular rhythm.   No murmur heard. Pulmonary/Chest: Effort normal and breath sounds normal.  Abdominal: Soft. Bowel sounds are normal. She exhibits no distension. There is no tenderness.  Musculoskeletal:  Rigidity to all ext, worse on the right  Neurological: She is alert.  Non verbal, not able to f/c.  Tracks with eyes. No obvious focal def.  Skin: Skin is warm and dry. She is not diaphoretic.  Psychiatric: She has a normal mood and affect.  Nursing note and vitals reviewed.   Labs reviewed: No results for input(s): NA, K, CL, CO2, GLUCOSE, BUN, CREATININE, CALCIUM, MG, PHOS in the last 8760 hours. No results for input(s): AST, ALT, ALKPHOS, BILITOT, PROT, ALBUMIN in the last 8760 hours. No results for input(s): WBC, NEUTROABS, HGB, HCT, MCV, PLT in the last 8760 hours. Lab Results  Component Value Date   TSH 2.21 12/18/2014   No results found for: HGBA1C Lab Results  Component Value Date   CHOL 138 03/28/2013   HDL 76 (A)  03/28/2013   LDLCALC 39 03/28/2013   TRIG 113 03/28/2013    Significant Diagnostic Results in last 30 days:  No results found.  Assessment/Plan  1. TMJ dysfunction May have muscle spasms to the facial muscles and dislocation of the jaw but this is not present on exam today Her goals of care are comfort based and so I have not made any referrals If this happens again we could try a muscle relaxer to see if this helps  2. Parkinson disease ([place]) Continue sinemet per Dr. Rexene Alberts No real benefit at this point  3. Dementia due to medical condition without behavioral disturbance Severe Not on meds due to lack of benefit Seems to be nearing the end of her disease course  4. Oropharyngeal dysphagia Worsening Continue current diet and asp prec No feeding tubes  5. Slow transit constipation Controlled Continue linzess and senna s   6. Seizures ([place]) Not noted  Continue prn valium   Family/ staff Communication: discussed with caregiver Juliann Pulse and staff  Labs/tests ordered: na

## 2016-05-18 ENCOUNTER — Non-Acute Institutional Stay (SKILLED_NURSING_FACILITY): Payer: Medicare Other | Admitting: Adult Health

## 2016-05-18 ENCOUNTER — Encounter: Payer: Self-pay | Admitting: Adult Health

## 2016-05-18 DIAGNOSIS — I10 Essential (primary) hypertension: Secondary | ICD-10-CM | POA: Diagnosis not present

## 2016-05-18 DIAGNOSIS — I480 Paroxysmal atrial fibrillation: Secondary | ICD-10-CM

## 2016-05-18 DIAGNOSIS — K5901 Slow transit constipation: Secondary | ICD-10-CM

## 2016-05-18 DIAGNOSIS — R1312 Dysphagia, oropharyngeal phase: Secondary | ICD-10-CM | POA: Diagnosis not present

## 2016-05-18 DIAGNOSIS — R253 Fasciculation: Secondary | ICD-10-CM | POA: Diagnosis not present

## 2016-05-18 DIAGNOSIS — G20 Parkinson's disease: Secondary | ICD-10-CM

## 2016-05-18 NOTE — Progress Notes (Signed)
Location:  Occupational psychologist of Service:  SNF (31) Provider:   [name], ANP [place] [phone]   [name], [name], DO  Patient Care Team: [name], DO as PCP - General (Geriatric Medicine) [facility] Star Age, MD as Attending Physician (Neurology) [name], NP as Nurse Practitioner (Neurology)  Extended Emergency Contact Information Primary Emergency Contact: Allston,[name] Address: [address]          [place], [place] [address] [name] of [place] Phone: [phone] Mobile Phone: [phone] Relation: Daughter  Code Status:  DNR Goals of care: Advanced Directive information Advanced Directives 02/26/2016  Does Patient Have a Medical Advance Directive? Yes  Type of Advance Directive Out of facility DNR (pink MOST or yellow form);[place];Living will  Does patient want to make changes to medical advance directive? -  Copy of [place] in Chart? Yes  Pre-existing out of facility DNR order (yellow form or pink MOST form) -     Chief Complaint  Patient presents with  . Medical Management of Chronic Issues    HPI:  83 y.o. female residing at [facility]. I am here to review her chronic medical issues. She has advanced PD and dementia. Functionally she is not ambulatory and requires assistance for all ADL's.  She is non verbal and not able to follow commands. She has 24/7 caregivers. She is sleeping more and has less p.o. Intake over time. Her weight remains stable at 136 lbs from my last visit.   She has severe dysphagia and is on a puree diet with honey thick liquids. She continues to cough at meals but has not developed aspiration pneumonia.  Bowel are moving well per caregiver.   Has not needed prn roxanol Hx of seizure like activity which were described as twitching episodes of the face or limbs. Some times she falls asleep,  other times she is awake for these episodes. We are not monitoring labs due to her goals of care and request of family. Gatorade was encouraged by the staff in case dehydration was the concern.   She remains on Sinemet per Dr. Rexene Alberts with neurology. Has rigidity to all ext (right worse than left).  No purposeful movements.     Past Medical History:  Diagnosis Date  . Anxiety   . Aortic valve disorders 04/11/2012  . Arthritis    osteoarthritis  . Atrial fibrillation ([place])   . Debility, unspecified 03/18/2012  . Dementia due to medical condition without behavioral disturbance 03/09/2012   04/07/12: ST evaluation revealed deficits in STM and visual spatial tasks. Some tasks not assessed due to language barrier. ST interventions continue.  05/10/2012:  Little progress with cognitive training;  patient always has someone with her to assist with problem solving.Mild, STML compensated with caregivers. Evaluation of functional status is difficult due to mobility limitation and dependence of caregiving staff.  06/06/12: No change.    . Dementia in conditions classified elsewhere without behavioral disturbance 04/07/2012  . Fracture closed, humerus 03/20/2012  . Generalized anxiety disorder   . Hyperlipidemia   . Hypertension   . Insomnia, unspecified 03/20/2012  . Memory loss 06/09/2012  . Osteoarthrosis, unspecified whether generalized or localized, unspecified site   . Other closed fractures of distal end of radius (alone) 03/20/2012  . Parkinson's disease ([place])   . Scoliosis   . Transient ischemic attack (TIA), and cerebral infarction without residual deficits(V12.54)   .  Unspecified constipation   . Urinary incontinence 07/05/2013   Re: PD   Past Surgical History:  Procedure Laterality Date  . BREAST SURGERY     benign lumpectomy  . CHOLECYSTECTOMY      Allergies  Allergen Reactions  . Iodine Anaphylaxis  . Iohexol      Desc: pt has had contrast in the past (years ago) and it causea  anaphylaxis- per pt's daughter.  stephanie davis,rtrct, Onset Date: [id number]     Allergies as of 05/18/2016      Reactions   Iodine Anaphylaxis   Iohexol     Desc: pt has had contrast in the past (years ago) and it causea anaphylaxis- per pt's daughter.  stephanie davis,rtrct, Onset Date: [id number]      Medication List       Accurate as of 05/18/16  4:11 PM. Always use your most recent med list.          acetaminophen 500 MG tablet Commonly known as:  TYLENOL Take 500 mg by mouth. Take twice daily   aspirin 81 MG tablet Take 81 mg by mouth daily.   bisacodyl 10 MG suppository Commonly known as:  DULCOLAX Place 10 mg rectally. One suppository, rectal daily as needed if no BM in two days   carbidopa-levodopa 25-100 MG tablet Commonly known as:  SINEMET IR Take 1 1/2 tablet 5 times a day, at 6 am, 10 am, 2 pm, 5:15 pm and bedtime.   cloNIDine 0.1 MG tablet Commonly known as:  CATAPRES Take 0.1 mg by mouth. Take one only if SBP 170 or greater; every 6 hours as needed   diazepam 10 MG Gel Commonly known as:  DIASTAT ACUDIAL Place 10 mg rectally once. Prn seizures   hydrALAZINE 50 MG tablet Commonly known as:  APRESOLINE 50 mg. Take one three times daily hold for systolic 99991111   lactose free nutrition Liqd Take 237 mLs by mouth. Once daily mid-afternoon   LINZESS 145 MCG Caps capsule Generic drug:  linaclotide Take 145 mcg by mouth daily before breakfast.   magnesium hydroxide 400 MG/5ML suspension Commonly known as:  MILK OF MAGNESIA Take 45 mLs by mouth daily as needed for mild constipation.   morphine 20 MG/ML concentrated solution Commonly known as:  ROXANOL Take 5 mg by mouth every 4 (four) hours as needed for severe pain.   sennosides-docusate sodium 8.6-50 MG tablet Commonly known as:  SENOKOT-S Take 2 tablets by mouth daily.       Review of Systems  Unable to perform ROS: Dementia    Immunization History  Administered Date(s) Administered  .  Influenza Inj Mdck Quad Pf 01/09/2016  . Influenza Whole 12/22/2011, 01/03/2013  . Influenza-Unspecified 12/26/2013, 01/03/2015  . PPD Test 04/21/2012  . Pneumococcal Conjugate-13 07/10/2014   Pertinent  Health Maintenance Due  Topic Date Due  . PNA vac Low Risk Adult (2 of 2 - PPSV23) 07/10/2015  . INFLUENZA VACCINE  Completed   Fall Risk  01/06/2016 08/23/2015 01/10/2014  Falls in the past year? No No No   Functional Status Survey:    Vitals:   05/18/16 1609  BP: (!) 153/87  Pulse: 69  Resp: 16  Temp: 97.7 F (36.5 C)  SpO2: 97%  Weight: 136 lb 6.4 oz (61.9 kg)   Body mass index is 25.77 kg/m.  Wt Readings from Last 3 Encounters:  05/18/16 136 lb 6.4 oz (61.9 kg)  03/30/16 136 lb (61.7 kg)  02/26/16 134 lb 14.4 oz (61.2 kg)  Physical Exam  Constitutional: No distress.  HENT:  Head: Normocephalic and atraumatic.  Right Ear: External ear normal.  Left Ear: External ear normal.  Nose: Nose normal.  Mouth/Throat: Oropharynx is clear and moist. No oropharyngeal exudate.  Eyes: Pupils are equal, round, and reactive to light.  Cardiovascular: Normal rate and regular rhythm.   No murmur heard. Pulmonary/Chest: Effort normal and breath sounds normal.  Abdominal: Soft. Bowel sounds are normal. She exhibits no distension. There is no tenderness.  Musculoskeletal:  Rigidity to all ext, worse on the right  Neurological: She is alert.  Non verbal, not able to f/c.  Tracks with eyes. No obvious focal def.  Skin: Skin is warm and dry. She is not diaphoretic.  Psychiatric: She has a normal mood and affect.  Nursing note and vitals reviewed.   Labs reviewed: No results for input(s): NA, K, CL, CO2, GLUCOSE, BUN, CREATININE, CALCIUM, MG, PHOS in the last 8760 hours. No results for input(s): AST, ALT, ALKPHOS, BILITOT, PROT, ALBUMIN in the last 8760 hours. No results for input(s): WBC, NEUTROABS, HGB, HCT, MCV, PLT in the last 8760 hours. Lab Results  Component Value Date    TSH 2.21 12/18/2014   No results found for: HGBA1C Lab Results  Component Value Date   CHOL 138 03/28/2013   HDL 76 (A) 03/28/2013   LDLCALC 39 03/28/2013   TRIG 113 03/28/2013    Significant Diagnostic Results in last 30 days:  No results found.  Assessment/Plan 1. Essential hypertension Controlled Continue Hydralazine 50 mg TID  2. Oropharyngeal dysphagia Continue puree diet with oral care and asp prec  3. Slow transit constipation Controlled Continue Linzess 145 mcg qd and senokot 8.6/50 2 qd  4. Parkinson disease ([place]) End stage Continue sinemet   5. Muscle twitching Some improvement Unclear if this is seizure activity  Diazepam 10 mg PR if needed, continue gatorade  6. Paroxysmal atrial fibrillation ([facility]) Rate controlled without meds On aspirin 81 mg not on anticoagulation due to previous hx of falls/goals of care   Family/ staff Communication: discussed with caregiver Juliann Pulse and staff  Labs/tests ordered: na

## 2016-07-14 ENCOUNTER — Encounter: Payer: Self-pay | Admitting: Internal Medicine

## 2016-07-14 ENCOUNTER — Non-Acute Institutional Stay (SKILLED_NURSING_FACILITY): Payer: Medicare Other | Admitting: Internal Medicine

## 2016-07-14 DIAGNOSIS — F0280 Dementia in other diseases classified elsewhere without behavioral disturbance: Secondary | ICD-10-CM

## 2016-07-14 DIAGNOSIS — K5901 Slow transit constipation: Secondary | ICD-10-CM | POA: Diagnosis not present

## 2016-07-14 DIAGNOSIS — M810 Age-related osteoporosis without current pathological fracture: Secondary | ICD-10-CM

## 2016-07-14 DIAGNOSIS — G20 Parkinson's disease: Secondary | ICD-10-CM

## 2016-07-14 DIAGNOSIS — I10 Essential (primary) hypertension: Secondary | ICD-10-CM | POA: Diagnosis not present

## 2016-07-14 DIAGNOSIS — R569 Unspecified convulsions: Secondary | ICD-10-CM | POA: Diagnosis not present

## 2016-07-14 DIAGNOSIS — R1312 Dysphagia, oropharyngeal phase: Secondary | ICD-10-CM

## 2016-07-14 DIAGNOSIS — G20A1 Parkinson's disease without dyskinesia, without mention of fluctuations: Secondary | ICD-10-CM

## 2016-07-14 NOTE — Progress Notes (Signed)
Patient ID: DUANE TRIAS, female   DOB: January 21, 1925, 83 y.o.   MRN: [id number]   Location:  [facility] Room Number: 132 Place of Service:  SNF (31) Provider:   R[place]out L. Mariea Clonts, D.O., C.M.D.  Hollace Kinnier, DO  Patient Care Team: [name], DO as PCP - General (Geriatric Medicine) [facility] Star Age, MD as Attending Physician (Neurology) [name], NP as Nurse Practitioner (Neurology)  Extended Emergency Contact Information Primary Emergency Contact: [facility] Address: [address]          [place], [place] [address] [name] of [place] Phone: [phone] Mobile Phone: [phone] Relation: Daughter  Code Status:  DNR Goals of care: Advanced Directive information Advanced Directives 07/14/2016  Does Patient Have a Medical Advance Directive? Yes  Type of Advance Directive Out of facility DNR (pink MOST or yellow form);[place];Living will  Does patient want to make changes to medical advance directive? -  Copy of [place] in Chart? Yes  Pre-existing out of facility DNR order (yellow form or pink MOST form) Yellow form placed in chart (order not valid for inpatient use);Pink MOST form placed in chart (order not valid for inpatient use)   Chief Complaint  Patient presents with  . Medical Management of Chronic Issues    routine visit    HPI:  Pt is a 83 y.o. female seen today for medical management of chronic diseases.    Advanced PD and dementia. Functionally she is not ambulatory and requires assistance for all ADL's.  She is non verbal and not able to follow commands. She has 24/7 caregivers. She is sleeping more and has less p.o. Intake over time. Her weight has declined 4 lbs since her last visit.  She remains on Sinemet per Dr. Rexene Alberts with neurology. Has rigidity to all ext (right worse than left).  No purposeful movements.  Her daughter has not wanted her to have  hospice care.  I noted today that her MOST form still said she may want to be hospitalized with limited interventions.  I asked RN supervisor to reach out to her daughter who agreed she did not want her hospitalized unless her comfort could not be met here and comfort care box was checked.  New form to be signed when her daughter visits her next time.  No IVFs and no tube feedings.  Pt continues to fall asleep at a moment's notice.  She sleeps with her mouth open and it gets very dry requiring some sponges to be used to keep her moist.  She is on boost supplements.    Severe dysphagia: on a puree diet with honey thick liquids. She continues to cough at meals but has not developed aspiration pneumonia.   Chronic constipation:  Bowels are moving well per caregiver.  On linzess, bisacodyl, mom.    Pain:  None notable, has not needed prn roxanol.    She has prn diazepam if she should have seizure activity only and this has not been necessary recently.  HTN:  On hydralazine 77m po tid to be held if bp <<326systolic.  BP last check was elevated.    Past Medical History:  Diagnosis Date  . Anxiety   . Aortic valve disorders 04/11/2012  . Arthritis    osteoarthritis  . Atrial fibrillation ([place]   . Debility, unspecified 03/18/2012  . Dementia due to medical condition without behavioral disturbance 03/09/2012   04/07/12: ST evaluation  revealed deficits in STM and visual spatial tasks. Some tasks not assessed due to language barrier. ST interventions continue.  05/10/2012:  Little progress with cognitive training;  patient always has someone with her to assist with problem solving.Mild, STML compensated with caregivers. Evaluation of functional status is difficult due to mobility limitation and dependence of caregiving staff.  06/06/12: No change.    . Dementia in conditions classified elsewhere without behavioral disturbance 04/07/2012  . Fracture closed, humerus 03/20/2012  . Generalized anxiety  disorder   . Hyperlipidemia   . Hypertension   . Insomnia, unspecified 03/20/2012  . Memory loss 06/09/2012  . Osteoarthrosis, unspecified whether generalized or localized, unspecified site   . Other closed fractures of distal end of radius (alone) 03/20/2012  . Parkinson's disease ([place])   . Scoliosis   . Transient ischemic attack (TIA), and cerebral infarction without residual deficits(V12.54)   . Unspecified constipation   . Urinary incontinence 07/05/2013   Re: PD   Past Surgical History:  Procedure Laterality Date  . BREAST SURGERY     benign lumpectomy  . CHOLECYSTECTOMY      Allergies  Allergen Reactions  . Iodine Anaphylaxis  . Iohexol      Desc: pt has had contrast in the past (years ago) and it causea anaphylaxis- per pt's daughter.  stephanie davis,rtrct, Onset Date: [id number]     Outpatient Encounter Prescriptions as of 07/14/2016  Medication Sig  . acetaminophen (TYLENOL) 500 MG tablet Take 500 mg by mouth. Take twice daily  . aspirin 81 MG tablet Take 81 mg by mouth daily.  . bisacodyl (DULCOLAX) 10 MG suppository Place 10 mg rectally. One suppository, rectal daily as needed if no BM in two days  . carbidopa-levodopa (SINEMET IR) 25-100 MG per tablet Take 1 1/2 tablet 5 times a day, at 6 am, 10 am, 2 pm, 5:15 pm and bedtime.  . cloNIDine (CATAPRES) 0.1 MG tablet Take 0.1 mg by mouth. Take one only if SBP 170 or greater; every 6 hours as needed  . diazepam (DIASTAT ACUDIAL) 10 MG GEL Place 10 mg rectally once. Prn seizures  . hydrALAZINE (APRESOLINE) 50 MG tablet 50 mg. Take one three times daily hold for systolic <035  . lactose free nutrition (BOOST) LIQD Take 237 mLs by mouth. Once daily mid-afternoon  . linaclotide (LINZESS) 145 MCG CAPS capsule Take 145 mcg by mouth daily before breakfast.  . magnesium hydroxide (MILK OF MAGNESIA) 400 MG/5ML suspension Take 45 mLs by mouth daily as needed for mild constipation.  Marland Kitchen morphine (ROXANOL) 20 MG/ML concentrated solution  Take 5 mg by mouth every 4 (four) hours as needed for severe pain.  . [DISCONTINUED] sennosides-docusate sodium (SENOKOT-S) 8.6-50 MG tablet Take 2 tablets by mouth daily.    No facility-administered encounter medications on file as of 07/14/2016.     Review of Systems  Reason unable to perform ROS: per nursing and caregiver.  Constitutional: Positive for fatigue. Negative for activity change, appetite change, chills, diaphoresis, fever and unexpected weight change.  HENT: Positive for trouble swallowing.        Dry mouth  Respiratory: Negative for apnea.   Cardiovascular: Negative for leg swelling.  Gastrointestinal: Negative for constipation.  Genitourinary: Negative for dysuria.  Musculoskeletal: Positive for gait problem.  Skin:       No skin breakdown  Neurological: Positive for weakness. Negative for seizures.       Rigidity from PD  Psychiatric/Behavioral: Negative for agitation.  Dementia    Immunization History  Administered Date(s) Administered  . Influenza Inj Mdck Quad Pf 01/09/2016  . Influenza Whole 12/22/2011, 01/03/2013  . Influenza-Unspecified 12/26/2013, 01/03/2015  . PPD Test 04/21/2012  . Pneumococcal Conjugate-13 07/10/2014   Pertinent  Health Maintenance Due  Topic Date Due  . PNA vac Low Risk Adult (2 of 2 - PPSV23) 07/10/2015  . INFLUENZA VACCINE  10/21/2016   Fall Risk  01/06/2016 08/23/2015 01/10/2014  Falls in the past year? No No No   Functional Status Survey:    Vitals:   07/14/16 1225  BP: (!) 165/76  Pulse: 68  Resp: 20  Temp: 98.6 F (37 C)  TempSrc: Oral  SpO2: 97%  Weight: 132 lb (59.9 kg)   Body mass index is 24.94 kg/m. Physical Exam  Constitutional: No distress.  HENT:  Head: Normocephalic and atraumatic.  Cardiovascular: Normal rate, regular rhythm and normal heart sounds.   Pulmonary/Chest: Effort normal and breath sounds normal. No respiratory distress.  Abdominal: Soft. Bowel sounds are normal. She exhibits no  distension. There is no tenderness.  Musculoskeletal:  Rigidity of extremities  Neurological:  Arousable, opens eyes, but quickly falls back to sleep with mouth open  Skin: Skin is warm and dry.    Labs reviewed: No results for input(s): NA, K, CL, CO2, GLUCOSE, BUN, CREATININE, CALCIUM, MG, PHOS in the last 8760 hours. No results for input(s): AST, ALT, ALKPHOS, BILITOT, PROT, ALBUMIN in the last 8760 hours. No results for input(s): WBC, NEUTROABS, HGB, HCT, MCV, PLT in the last 8760 hours. Lab Results  Component Value Date   TSH 2.21 12/18/2014   No results found for: HGBA1C Lab Results  Component Value Date   CHOL 138 03/28/2013   HDL 76 (A) 03/28/2013   LDLCALC 39 03/28/2013   TRIG 113 03/28/2013   Avoiding labs due to comfort measure goals  Assessment/Plan  1. Parkinson disease ([place]) -end stages, receiving comfort care and 24 hr caregivers -appears comfortable and well cared for w/o any skin breakdown -MOST goals reviewed with nurse manager who contacted her daughter and we confirmed no hospitalizations except if something were broken and pain relief could not be achieved here  2. Dementia due to medical condition without behavioral disturbance -is nonverbal and not able to follow commands or perform purposeful movements outside of opening and closing her eyes -cont 24 hr caregivers and snf care  3. Oropharyngeal dysphagia -cont modified diet and supplements, aspiration precautions  4. Essential hypertension -bp elevated today, must always be rechecked by nursing with manual cuff if this is the case (unclear if accurate with auto cuff) -cont hydralazine  5. Slow transit constipation -cont current regimen as above which has been effective  6. Seizures ([place]) -no recent episodes, has prn valium if this should occur  7. Senile osteoporosis -off medications due to goals of care and immobility  No labs ordered:   due to goals of care  Kennieth Plotts L. Lothar Prehn,  D.O. [facility] [address], [place] [address] Cell Phone (Mon-Fri 8am-5pm):  [phone] On Call:  [phone] & follow prompts after 5pm & weekends Office Phone:  [phone] Office Fax:  [phone]

## 2016-07-22 ENCOUNTER — Non-Acute Institutional Stay (SKILLED_NURSING_FACILITY): Payer: Medicare Other

## 2016-07-22 DIAGNOSIS — Z0000 Encounter for general adult medical examination without abnormal findings: Secondary | ICD-10-CM

## 2016-07-22 NOTE — Patient Instructions (Signed)
Vanessa Mcbride , Thank you for taking time to come for your Medicare Wellness Visit. I appreciate your ongoing commitment to your health goals. Please review the following plan we discussed and let me know if I can assist you in the future.   Screening recommendations/referrals: Colonoscopy up to date Mammogram up to date Bone Density due but not recommended due to health state. Recommended yearly ophthalmology/optometry visit for glaucoma screening and checkup Recommended yearly dental visit for hygiene and checkup  Vaccinations: Influenza vaccine up to date Pneumococcal vaccine  Tdap vaccine due. Shingles vaccine not in records. If you want this we will put order in  Advanced directives: In chart  Conditions/risks identified: none  Next appointment: none upcoming   Preventive Care 65 Years and Older, Female Preventive care refers to lifestyle choices and visits with your health care provider that can promote health and wellness. What does preventive care include?  A yearly physical exam. This is also called an annual well check.  Dental exams once or twice a year.  Routine eye exams. Ask your health care provider how often you should have your eyes checked.  Personal lifestyle choices, including:  Daily care of your teeth and gums.  Regular physical activity.  Eating a healthy diet.  Avoiding tobacco and drug use.  Limiting alcohol use.  Practicing safe sex.  Taking low-dose aspirin every day.  Taking vitamin and mineral supplements as recommended by your health care provider. What happens during an annual well check? The services and screenings done by your health care provider during your annual well check will depend on your age, overall health, lifestyle risk factors, and family history of disease. Counseling  Your health care provider may ask you questions about your:  Alcohol use.  Tobacco use.  Drug use.  Emotional well-being.  Home and relationship  well-being.  Sexual activity.  Eating habits.  History of falls.  Memory and ability to understand (cognition).  Work and work Statistician.  Reproductive health. Screening  You may have the following tests or measurements:  Height, weight, and BMI.  Blood pressure.  Lipid and cholesterol levels. These may be checked every 5 years, or more frequently if you are over 77 years old.  Skin check.  Lung cancer screening. You may have this screening every year starting at age 49 if you have a 30-pack-year history of smoking and currently smoke or have quit within the past 15 years.  Fecal occult blood test (FOBT) of the stool. You may have this test every year starting at age 72.  Flexible sigmoidoscopy or colonoscopy. You may have a sigmoidoscopy every 5 years or a colonoscopy every 10 years starting at age 51.  Hepatitis C blood test.  Hepatitis B blood test.  Sexually transmitted disease (STD) testing.  Diabetes screening. This is done by checking your blood sugar (glucose) after you have not eaten for a while (fasting). You may have this done every 1-3 years.  Bone density scan. This is done to screen for osteoporosis. You may have this done starting at age 31.  Mammogram. This may be done every 1-2 years. Talk to your health care provider about how often you should have regular mammograms. Talk with your health care provider about your test results, treatment options, and if necessary, the need for more tests. Vaccines  Your health care provider may recommend certain vaccines, such as:  Influenza vaccine. This is recommended every year.  Tetanus, diphtheria, and acellular pertussis (Tdap, Td) vaccine. You may  need a Td booster every 10 years.  Zoster vaccine. You may need this after age 70.  Pneumococcal 13-valent conjugate (PCV13) vaccine. One dose is recommended after age 60.  Pneumococcal polysaccharide (PPSV23) vaccine. One dose is recommended after age  56. Talk to your health care provider about which screenings and vaccines you need and how often you need them. This information is not intended to replace advice given to you by your health care provider. Make sure you discuss any questions you have with your health care provider. Document Released: 04/05/2015 Document Revised: 11/27/2015 Document Reviewed: 01/08/2015 Elsevier Interactive Patient Education  2017 [place] Prevention in the Home Falls can cause injuries. They can happen to people of all ages. There are many things you can do to make your home safe and to help prevent falls. What can I do on the outside of my home?  Regularly fix the edges of walkways and driveways and fix any cracks.  Remove anything that might make you trip as you walk through a door, such as a raised step or threshold.  Trim any bushes or trees on the path to your home.  Use bright outdoor lighting.  Clear any walking paths of anything that might make someone trip, such as rocks or tools.  Regularly check to see if handrails are loose or broken. Make sure that both sides of any steps have handrails.  Any raised decks and porches should have guardrails on the edges.  Have any leaves, snow, or ice cleared regularly.  Use sand or salt on walking paths during winter.  Clean up any spills in your garage right away. This includes oil or grease spills. What can I do in the bathroom?  Use night lights.  Install grab bars by the toilet and in the tub and shower. Do not use towel bars as grab bars.  Use non-skid mats or decals in the tub or shower.  If you need to sit down in the shower, use a plastic, non-slip stool.  Keep the floor dry. Clean up any water that spills on the floor as soon as it happens.  Remove soap buildup in the tub or shower regularly.  Attach bath mats securely with double-sided non-slip rug tape.  Do not have throw rugs and other things on the floor that can make  you trip. What can I do in the bedroom?  Use night lights.  Make sure that you have a light by your bed that is easy to reach.  Do not use any sheets or blankets that are too big for your bed. They should not hang down onto the floor.  Have a firm chair that has side arms. You can use this for support while you get dressed.  Do not have throw rugs and other things on the floor that can make you trip. What can I do in the kitchen?  Clean up any spills right away.  Avoid walking on wet floors.  Keep items that you use a lot in easy-to-reach places.  If you need to reach something above you, use a strong step stool that has a grab bar.  Keep electrical cords out of the way.  Do not use floor polish or wax that makes floors slippery. If you must use wax, use non-skid floor wax.  Do not have throw rugs and other things on the floor that can make you trip. What can I do with my stairs?  Do not leave any items on  the stairs.  Make sure that there are handrails on both sides of the stairs and use them. Fix handrails that are broken or loose. Make sure that handrails are as long as the stairways.  Check any carpeting to make sure that it is firmly attached to the stairs. Fix any carpet that is loose or worn.  Avoid having throw rugs at the top or bottom of the stairs. If you do have throw rugs, attach them to the floor with carpet tape.  Make sure that you have a light switch at the top of the stairs and the bottom of the stairs. If you do not have them, ask someone to add them for you. What else can I do to help prevent falls?  Wear shoes that:  Do not have high heels.  Have rubber bottoms.  Are comfortable and fit you well.  Are closed at the toe. Do not wear sandals.  If you use a stepladder:  Make sure that it is fully opened. Do not climb a closed stepladder.  Make sure that both sides of the stepladder are locked into place.  Ask someone to hold it for you, if  possible.  Clearly mark and make sure that you can see:  Any grab bars or handrails.  First and last steps.  Where the edge of each step is.  Use tools that help you move around (mobility aids) if they are needed. These include:  Canes.  Walkers.  Scooters.  Crutches.  Turn on the lights when you go into a dark area. Replace any light bulbs as soon as they burn out.  Set up your furniture so you have a clear path. Avoid moving your furniture around.  If any of your floors are uneven, fix them.  If there are any pets around you, be aware of where they are.  Review your medicines with your doctor. Some medicines can make you feel dizzy. This can increase your chance of falling. Ask your doctor what other things that you can do to help prevent falls. This information is not intended to replace advice given to you by your health care provider. Make sure you discuss any questions you have with your health care provider. Document Released: 01/03/2009 Document Revised: 08/15/2015 Document Reviewed: 04/13/2014 Elsevier Interactive Patient Education  2017 [employer].

## 2016-07-22 NOTE — Progress Notes (Signed)
   I reviewed health advisor's note, was available for consultation and agree with the assessment and plan as written.    Dellie Piasecki L. Bentlee Drier, D.O. [facility] [address], [place] [address] Cell Phone (Mon-Fri 8am-5pm):  [phone] On Call:  [phone] & follow prompts after 5pm & weekends Office Phone:  [phone] Office Fax:  [phone]   Quick Notes   Health Maintenance: PN23 order put in. TDAP due   Abnormal Screen: MMSEand clock unable to complete due to parkinsons   Patient Concerns: None   Nurse Concerns: None

## 2016-07-22 NOTE — Progress Notes (Signed)
Subjective:   Vanessa Mcbride is a 83 y.o. female who presents for an Initial Medicare Annual Wellness Visit at [facility]  Cardiac Risk Factors include: advanced age (>78men, >59 women);hypertension;family history of premature cardiovascular disease     Objective:    Today's Vitals   07/22/16 1201  BP: 126/80  Pulse: (!) 57  Temp: (!) 96.6 F (35.9 C)  TempSrc: Axillary  SpO2: 98%  Weight: 132 lb 6.4 oz (60.1 kg)  Height: 5\' 1"  (1.549 m)   Body mass index is 25.02 kg/m.   Current Medications (verified) Outpatient Encounter Prescriptions as of 07/22/2016  Medication Sig  . acetaminophen (TYLENOL) 500 MG tablet Take 500 mg by mouth. Take twice daily  . aspirin 81 MG tablet Take 81 mg by mouth daily.  . bisacodyl (DULCOLAX) 10 MG suppository Place 10 mg rectally. One suppository, rectal daily as needed if no BM in two days  . carbidopa-levodopa (SINEMET IR) 25-100 MG per tablet Take 1 1/2 tablet 5 times a day, at 6 am, 10 am, 2 pm, 5:15 pm and bedtime.  . cloNIDine (CATAPRES) 0.1 MG tablet Take 0.1 mg by mouth. Take one only if SBP 170 or greater; every 6 hours as needed  . diazepam (DIASTAT ACUDIAL) 10 MG GEL Place 10 mg rectally once. Prn seizures  . hydrALAZINE (APRESOLINE) 50 MG tablet 50 mg. Take one three times daily hold for systolic <607  . lactose free nutrition (BOOST) LIQD Take 237 mLs by mouth. Once daily mid-afternoon  . linaclotide (LINZESS) 145 MCG CAPS capsule Take 145 mcg by mouth daily before breakfast.  . magnesium hydroxide (MILK OF MAGNESIA) 400 MG/5ML suspension Take 45 mLs by mouth daily as needed for mild constipation.  Marland Kitchen morphine (ROXANOL) 20 MG/ML concentrated solution Take 5 mg by mouth every 4 (four) hours as needed for severe pain.   No facility-administered encounter medications on file as of 07/22/2016.     Allergies (verified) Iodine and Iohexol   History: Past Medical History:  Diagnosis Date  . Anxiety   . Aortic valve  disorders 04/11/2012  . Arthritis    osteoarthritis  . Atrial fibrillation ([place])   . Debility, unspecified 03/18/2012  . Dementia due to medical condition without behavioral disturbance 03/09/2012   04/07/12: ST evaluation revealed deficits in STM and visual spatial tasks. Some tasks not assessed due to language barrier. ST interventions continue.  05/10/2012:  Little progress with cognitive training;  patient always has someone with her to assist with problem solving.Mild, STML compensated with caregivers. Evaluation of functional status is difficult due to mobility limitation and dependence of caregiving staff.  06/06/12: No change.    . Dementia in conditions classified elsewhere without behavioral disturbance 04/07/2012  . Fracture closed, humerus 03/20/2012  . Generalized anxiety disorder   . Hyperlipidemia   . Hypertension   . Insomnia, unspecified 03/20/2012  . Memory loss 06/09/2012  . Osteoarthrosis, unspecified whether generalized or localized, unspecified site   . Other closed fractures of distal end of radius (alone) 03/20/2012  . Parkinson's disease ([place])   . Scoliosis   . Transient ischemic attack (TIA), and cerebral infarction without residual deficits(V12.54)   . Unspecified constipation   . Urinary incontinence 07/05/2013   Re: PD   Past Surgical History:  Procedure Laterality Date  . BREAST SURGERY     benign lumpectomy  . CHOLECYSTECTOMY     Family History  Problem Relation Age of Onset  . Heart disease Son  CAD  . Diabetes Mother   . Diabetes Sister   . Hypertension Sister    Social History   Occupational History  . retired Armed forces operational officer    Social History Main Topics  . Smoking status: Never Smoker  . Smokeless tobacco: Never Used  . Alcohol use No  . Drug use: No  . Sexual activity: No    Tobacco Counseling Counseling given: Not Answered   Activities of Daily Living In your present state of health, do you have any difficulty performing the  following activities: 07/22/2016  Hearing? Y  Vision? Y  Difficulty concentrating or making decisions? Y  Walking or climbing stairs? Y  Dressing or bathing? Y  Doing errands, shopping? Y  Preparing Food and eating ? Y  Using the Toilet? Y  In the past six months, have you accidently leaked urine? Y  Do you have problems with loss of bowel control? Y  Managing your Medications? Y  Managing your Finances? Y  Housekeeping or managing your Housekeeping? Y  Some recent data might be hidden    Immunizations and Health Maintenance Immunization History  Administered Date(s) Administered  . Influenza Inj Mdck Quad Pf 01/09/2016  . Influenza Whole 12/22/2011, 01/03/2013  . Influenza-Unspecified 12/26/2013, 01/03/2015  . PPD Test 04/21/2012  . Pneumococcal Conjugate-13 07/10/2014   Health Maintenance Due  Topic Date Due  . TETANUS/TDAP  12/08/1943  . PNA vac Low Risk Adult (2 of 2 - PPSV23) 07/10/2015    Patient Care Team: [name], DO as PCP - General (Geriatric Medicine) [facility] Star Age, MD as Attending Physician (Neurology) [name], NP as Nurse Practitioner (Neurology)  Indicate any recent Medical Services you may have received from other than [facility] providers in the past year (date may be approximate).     Assessment:   This is a routine wellness examination for Vanessa Mcbride.   Hearing/Vision screen No exam data present  Dietary issues and exercise activities discussed: Current Exercise Habits: The patient does not participate in regular exercise at present, Exercise limited by: orthopedic condition(s)  Goals    . Maintain Lifestyle          Starting 07/22/2016 pt will maintain her lifestyle.      Depression Screen PHQ 2/9 Scores 07/22/2016 01/06/2016 08/23/2015 01/10/2014  PHQ - 2 Score - - - 0  Exception Documentation Medical reason Medical reason Other- indicate reason in comment box -  Not completed - - no verbal -    Fall Risk Fall  Risk  07/22/2016 01/06/2016 08/23/2015 01/10/2014  Falls in the past year? No No No No    Cognitive Function: MMSE - Mini Mental State Exam 07/22/2016  Not completed: Unable to complete        Screening Tests Health Maintenance  Topic Date Due  . TETANUS/TDAP  12/08/1943  . PNA vac Low Risk Adult (2 of 2 - PPSV23) 07/10/2015  . INFLUENZA VACCINE  10/21/2016      Plan: I have personally reviewed and addressed the Medicare Annual Wellness questionnaire and have noted the following in the patient's chart:  A. Medical and social history B. Use of alcohol, tobacco or illicit drugs  C. Current medications and supplements D. Functional ability and status E.  Nutritional status F.  Physical activity G. Advance directives H. List of other physicians I.  Hospitalizations, surgeries, and ER visits in previous 12 months J.  [place] to include hearing, vision, cognitive, depression L. Referrals and appointments -  none  In addition, I have reviewed and discussed with patient certain preventive protocols, quality metrics, and best practice recommendations. A written personalized care plan for preventive services as well as general preventive health recommendations were provided to patient.  See attached scanned questionnaire for additional information.   Signed,   Rich Reining, RN Nurse Health Advisor

## 2016-07-28 DIAGNOSIS — Z23 Encounter for immunization: Secondary | ICD-10-CM | POA: Diagnosis not present

## 2016-08-10 ENCOUNTER — Encounter: Payer: Self-pay | Admitting: Adult Health

## 2016-08-10 ENCOUNTER — Non-Acute Institutional Stay (SKILLED_NURSING_FACILITY): Payer: Medicare Other | Admitting: Adult Health

## 2016-08-10 DIAGNOSIS — R1312 Dysphagia, oropharyngeal phase: Secondary | ICD-10-CM | POA: Diagnosis not present

## 2016-08-10 DIAGNOSIS — G20 Parkinson's disease: Secondary | ICD-10-CM | POA: Diagnosis not present

## 2016-08-10 DIAGNOSIS — I10 Essential (primary) hypertension: Secondary | ICD-10-CM | POA: Diagnosis not present

## 2016-08-10 DIAGNOSIS — L308 Other specified dermatitis: Secondary | ICD-10-CM

## 2016-08-10 NOTE — Progress Notes (Addendum)
Location:  Occupational psychologist of Service:  SNF (31) Provider:   [name], ANP [place] [phone]   [name], DO  Patient Care Team: [name], DO as PCP - General (Geriatric Medicine) [facility] [place], MD as Attending Physician (Neurology) [name], NP as Nurse Practitioner (Neurology)  Extended Emergency Contact Information Primary Emergency Contact: [facility] Address: [address]          [place], [place] [address] [name] of [place] Phone: [phone] Mobile Phone: [phone] Relation: Daughter  Code Status:  DNR Goals of care: Advanced Directive information Advanced Directives 08/10/2016  Does Patient Have a Medical Advance Directive? Yes  Type of Paramedic of [place];Living will  Does patient want to make changes to medical advance directive? -  Copy of [place] in Chart? Yes  Pre-existing out of facility DNR order (yellow form or pink MOST form) Yellow form placed in chart (order not valid for inpatient use);Pink MOST form placed in chart (order not valid for inpatient use)     Chief Complaint  Patient presents with  . Acute Visit    rash  . Medical Management of Chronic Issues    HPI:  83 y.o. female residing at [facility]. I am here to review her chronic medical issues. She has advanced PD and dementia. Functionally she is not ambulatory and requires assistance for all ADL's.  She is non verbal and not able to follow commands. She has 24/7 caregivers. She is sleeping more and has less p.o. Intake over time. Her weight has decreased by 4 lbs to 132 lbs.   She has severe dysphagia and is on a puree diet with honey thick liquids. She continues to cough at meals but has not developed aspiration pneumonia. Staff have difficulty getting her to swallow her pills.   She remains on Sinemet per Dr.  Rexene Alberts with neurology. Has rigidity to all ext (right worse than left).  No purposeful movements.  Her right arm contracts up when she is alert and becomes very rigid but relaxes when she is asleep.   The staff reports a scaly red rash to her back present for several days.   HTN: BP controlled with labetalol. Does not regularly need Clonidine.Goals of care are comfort based.  Past Medical History:  Diagnosis Date  . Anxiety   . Aortic valve disorders 04/11/2012  . Arthritis    osteoarthritis  . Atrial fibrillation ([place])   . Debility, unspecified 03/18/2012  . Dementia due to medical condition without behavioral disturbance 03/09/2012   04/07/12: ST evaluation revealed deficits in STM and visual spatial tasks. Some tasks not assessed due to language barrier. ST interventions continue.  05/10/2012:  Little progress with cognitive training;  patient always has someone with her to assist with problem solving.Mild, STML compensated with caregivers. Evaluation of functional status is difficult due to mobility limitation and dependence of caregiving staff.  06/06/12: No change.    . Dementia in conditions classified elsewhere without behavioral disturbance 04/07/2012  . Fracture closed, humerus 03/20/2012  . Generalized anxiety disorder   . Hyperlipidemia   . Hypertension   . Insomnia, unspecified 03/20/2012  . Memory loss 06/09/2012  . Osteoarthrosis, unspecified whether generalized or localized, unspecified site   . Other closed fractures of distal end of radius (alone) 03/20/2012  . Parkinson's disease ([place])   . Scoliosis   . Transient ischemic attack (  TIA), and cerebral infarction without residual deficits(V12.54)   . Unspecified constipation   . Urinary incontinence 07/05/2013   Re: PD   Past Surgical History:  Procedure Laterality Date  . BREAST SURGERY     benign lumpectomy  . CHOLECYSTECTOMY      Allergies  Allergen Reactions  . Iodine Anaphylaxis  . Iohexol      Desc: pt has had  contrast in the past (years ago) and it causea anaphylaxis- per pt's daughter.  stephanie davis,rtrct, Onset Date: [id number]     Allergies as of 08/10/2016      Reactions   Iodine Anaphylaxis   Iohexol     Desc: pt has had contrast in the past (years ago) and it causea anaphylaxis- per pt's daughter.  stephanie davis,rtrct, Onset Date: [id number]      Medication List       Accurate as of 08/10/16  2:11 PM. Always use your most recent med list.          acetaminophen 500 MG tablet Commonly known as:  TYLENOL Take 500 mg by mouth. Take twice daily   aspirin 81 MG tablet Take 81 mg by mouth daily.   bisacodyl 10 MG suppository Commonly known as:  DULCOLAX Place 10 mg rectally. One suppository, rectal daily as needed if no BM in two days   carbidopa-levodopa 25-100 MG tablet Commonly known as:  SINEMET IR Take 1 1/2 tablet 5 times a day, at 6 am, 10 am, 2 pm, 5:15 pm and bedtime.   cloNIDine 0.1 MG tablet Commonly known as:  CATAPRES Take 0.1 mg by mouth. Take one only if SBP 170 or greater; every 6 hours as needed   diazepam 10 MG Gel Commonly known as:  DIASTAT ACUDIAL Place 10 mg rectally once. Prn seizures   hydrALAZINE 50 MG tablet Commonly known as:  APRESOLINE 50 mg. Take one three times daily hold for systolic <408   lactose free nutrition Liqd Take 237 mLs by mouth. Once daily mid-afternoon   LINZESS 145 MCG Caps capsule Generic drug:  linaclotide Take 145 mcg by mouth daily before breakfast.   magnesium hydroxide 400 MG/5ML suspension Commonly known as:  MILK OF MAGNESIA Take 45 mLs by mouth daily as needed for mild constipation.   morphine 20 MG/ML concentrated solution Commonly known as:  ROXANOL Take 5 mg by mouth every 4 (four) hours as needed for severe pain.       Review of Systems  Unable to perform ROS: Dementia    Immunization History  Administered Date(s) Administered  . Influenza Inj Mdck Quad Pf 01/09/2016  . Influenza Whole  12/22/2011, 01/03/2013  . Influenza-Unspecified 12/26/2013, 01/03/2015  . PPD Test 04/21/2012  . Pneumococcal Conjugate-13 07/10/2014  . Pneumococcal Polysaccharide-23 07/28/2016   Pertinent  Health Maintenance Due  Topic Date Due  . PNA vac Low Risk Adult (2 of 2 - PPSV23) 07/10/2015  . INFLUENZA VACCINE  10/21/2016   Fall Risk  07/22/2016 01/06/2016 08/23/2015 01/10/2014  Falls in the past year? No No No No   Functional Status Survey:    Vitals:   08/10/16 1410  BP: (!) 156/88  Pulse: 67  Resp: (!) 22  Temp: 97 F (36.1 C)  SpO2: 96%  Weight: 132 lb 3.2 oz (60 kg)   Body mass index is 24.98 kg/m.  Wt Readings from Last 3 Encounters:  08/10/16 132 lb 3.2 oz (60 kg)  07/22/16 132 lb 6.4 oz (60.1 kg)  07/14/16 132 lb (59.9 kg)  Physical Exam  Constitutional: No distress.  HENT:  Head: Normocephalic and atraumatic.  Eyes: Pupils are equal, round, and reactive to light.  Cardiovascular: Normal rate and regular rhythm.   No murmur heard. Pulmonary/Chest: Effort normal and breath sounds normal.  Abdominal: Soft. Bowel sounds are normal. She exhibits no distension. There is no tenderness.  Musculoskeletal:  Rigidity to all ext, worse on the right  Neurological: She is alert.  Non verbal, not able to f/c.  Tracks with eyes. No obvious focal def.  Skin: Skin is warm and dry. Rash (circular to mid back with erythema and dry scaly skin) noted. She is not diaphoretic.  Psychiatric: She has a normal mood and affect.  Nursing note and vitals reviewed.   Labs reviewed: No results for input(s): NA, K, CL, CO2, GLUCOSE, BUN, CREATININE, CALCIUM, MG, PHOS in the last 8760 hours. No results for input(s): AST, ALT, ALKPHOS, BILITOT, PROT, ALBUMIN in the last 8760 hours. No results for input(s): WBC, NEUTROABS, HGB, HCT, MCV, PLT in the last 8760 hours. Lab Results  Component Value Date   TSH 2.21 12/18/2014   No results found for: HGBA1C Lab Results  Component Value Date    CHOL 138 03/28/2013   HDL 76 (A) 03/28/2013   LDLCALC 39 03/28/2013   TRIG 113 03/28/2013    Significant Diagnostic Results in last 30 days:  No results found.  Assessment/Plan 1. Other eczema Lotrisone apply to rash on back BID for 10 days  2. Essential hypertension Controlled No aggressive treatment due to goals of care Her daughter has declined labs due to lack of benefit.  3. Oropharyngeal dysphagia Progressive weight loss and difficulty swallowing Continue modified diet and asp prec Avoid force feeding.   4. Parkinson disease ([place]) Severe, dependent for all ADLs and non verbal Continue Sinemet per Dr. Rexene Alberts until no longer able to swallow   Family/ staff Communication: discussed with caregiver Juliann Pulse and staff  Labs/tests ordered: na

## 2016-09-11 ENCOUNTER — Non-Acute Institutional Stay (SKILLED_NURSING_FACILITY): Payer: Medicare Other | Admitting: Adult Health

## 2016-09-11 ENCOUNTER — Encounter: Payer: Self-pay | Admitting: Adult Health

## 2016-09-11 DIAGNOSIS — F0280 Dementia in other diseases classified elsewhere without behavioral disturbance: Secondary | ICD-10-CM

## 2016-09-11 DIAGNOSIS — M1990 Unspecified osteoarthritis, unspecified site: Secondary | ICD-10-CM | POA: Diagnosis not present

## 2016-09-11 DIAGNOSIS — G20 Parkinson's disease: Secondary | ICD-10-CM

## 2016-09-11 DIAGNOSIS — K5901 Slow transit constipation: Secondary | ICD-10-CM | POA: Diagnosis not present

## 2016-09-11 DIAGNOSIS — I10 Essential (primary) hypertension: Secondary | ICD-10-CM

## 2016-09-11 DIAGNOSIS — R1312 Dysphagia, oropharyngeal phase: Secondary | ICD-10-CM

## 2016-09-11 NOTE — Progress Notes (Signed)
Location:  Occupational psychologist of Service:  SNF (31) Provider:   Cindi Mcbride, ANP Vanessa Mcbride [phone]   Vanessa Curry, DO  Patient Care Team: Vanessa Curry, DO as PCP - General (Geriatric Medicine) Community, Well Vanessa Florida, MD as Attending Physician (Neurology) Vanessa Pali, NP as Nurse Practitioner (Neurology)  Extended Emergency Contact Information Primary Emergency Contact: Vanessa Mcbride Address: [address]          [place], [place] [address] Vanessa Mcbride of [place] Phone: [phone] Mobile Phone: [phone] Relation: Daughter  Code Status:  DNR Goals of care: Advanced Directive information Advanced Directives 09/11/2016  Does Patient Have a Medical Advance Directive? Yes  Type of Paramedic of [place];Living will;Out of facility DNR (pink MOST or yellow form)  Does patient want to make changes to medical advance directive? -  Copy of [place] in Chart? Yes  Pre-existing out of facility DNR order (yellow form or pink MOST form) -     Chief Complaint  Patient presents with  . Medical Management of Chronic Issues    HPI:  83 y.o. female residing at [facility]. I am here to review her chronic medical issues. She has advanced PD and dementia. Functionally she is not ambulatory and requires assistance for all ADL's.  She is non verbal and not able to follow commands. She has 24/7 caregivers. She is sleeping more and has less p.o. Intake over time. Her weight has decreased by 4 lbs to 128lbs since our last visit. The weights have shown a steady decline due to poor intake.  She has severe dysphagia and is on a puree diet with honey thick liquids. She continues to cough at meals but has not developed aspiration pneumonia. Staff have difficulty getting her to swallow her pills.  She remains on Sinemet per Dr. Rexene Alberts with neurology. Has  rigidity to all ext (right worse than left).  No purposeful movements.  Her right arm contracts up when she is alert and becomes very rigid but relaxes when she is asleep.   Update: No changes in condition. No reports of pain or discomfort  Periodically has twitching motions to her upper ext of unclear etiology. There was concern for seizure activity but also possibly a myoclonic jerking. Her daughter has elected not to have labs done (which we discussed previously) due to lack of benefit  HTN: BP controlled with labetalol. Does not regularly need Clonidine.Goals of care are comfort based.  Constipation: has used linzess for constipation and needed prn dulcolax supp 2 x in the past two weeks  Past Medical History:  Diagnosis Date  . Anxiety   . Aortic valve disorders 04/11/2012  . Arthritis    osteoarthritis  . Atrial fibrillation ([place])   . Debility, unspecified 03/18/2012  . Dementia due to medical condition without behavioral disturbance 03/09/2012   04/07/12: ST evaluation revealed deficits in STM and visual spatial tasks. Some tasks not assessed due to language barrier. ST interventions continue.  05/10/2012:  Little progress with cognitive training;  patient always has someone with her to assist with problem solving.Mild, STML compensated with caregivers. Evaluation of functional status is difficult due to mobility limitation and dependence of caregiving staff.  06/06/12: No change.    . Dementia in conditions classified elsewhere without behavioral disturbance 04/07/2012  . Fracture closed, humerus 03/20/2012  . Generalized anxiety disorder   . Hyperlipidemia   . Hypertension   .  Insomnia, unspecified 03/20/2012  . Memory loss 06/09/2012  . Osteoarthrosis, unspecified whether generalized or localized, unspecified site   . Other closed fractures of distal end of radius (alone) 03/20/2012  . Parkinson's disease ([place])   . Scoliosis   . Transient ischemic attack (TIA), and cerebral  infarction without residual deficits(V12.54)   . Unspecified constipation   . Urinary incontinence 07/05/2013   Re: PD   Past Surgical History:  Procedure Laterality Date  . BREAST SURGERY     benign lumpectomy  . CHOLECYSTECTOMY      Allergies  Allergen Reactions  . Iodine Anaphylaxis  . Iohexol      Desc: pt has had contrast in the past (years ago) and it causea anaphylaxis- per pt's daughter.  Vanessa Mcbride,rtrct, Onset Date: [id number]     Allergies as of 09/11/2016      Reactions   Iodine Anaphylaxis   Iohexol     Desc: pt has had contrast in the past (years ago) and it causea anaphylaxis- per pt's daughter.  Vanessa Mcbride,rtrct, Onset Date: [id number]      Medication List       Accurate as of 09/11/16 10:57 AM. Always use your most recent med list.          acetaminophen 500 MG tablet Commonly known as:  TYLENOL Take 500 mg by mouth. Take twice daily   aspirin 81 MG tablet Take 81 mg by mouth daily.   bisacodyl 10 MG suppository Commonly known as:  DULCOLAX Place 10 mg rectally. One suppository, rectal daily as needed if no BM in two days   carbidopa-levodopa 25-100 MG tablet Commonly known as:  SINEMET IR Take 1 1/2 tablet 5 times a day, at 6 am, 10 am, 2 pm, 5:15 pm and bedtime.   cloNIDine 0.1 MG tablet Commonly known as:  CATAPRES Take 0.1 mg by mouth. Take one only if SBP 170 or greater; every 6 hours as needed   diazepam 10 MG Gel Commonly known as:  DIASTAT ACUDIAL Place 10 mg rectally once. Prn seizures   hydrALAZINE 50 MG tablet Commonly known as:  APRESOLINE 50 mg. Take one three times daily hold for systolic <440   lactose free nutrition Liqd Take 237 mLs by mouth. Once daily mid-afternoon   LINZESS 145 MCG Caps capsule Generic drug:  linaclotide Take 145 mcg by mouth daily before breakfast.   magnesium hydroxide 400 MG/5ML suspension Commonly known as:  MILK OF MAGNESIA Take 45 mLs by mouth daily as needed for mild constipation.     morphine 20 MG/ML concentrated solution Commonly known as:  ROXANOL Take 5 mg by mouth every 4 (four) hours as needed for severe pain.       Review of Systems  Unable to perform ROS: Dementia    Immunization History  Administered Date(s) Administered  . Influenza Inj Mdck Quad Pf 01/09/2016  . Influenza Whole 12/22/2011, 01/03/2013  . Influenza-Unspecified 12/26/2013, 01/03/2015  . PPD Test 04/21/2012  . Pneumococcal Conjugate-13 07/10/2014  . Pneumococcal Polysaccharide-23 07/28/2016   Pertinent  Health Maintenance Due  Topic Date Due  . INFLUENZA VACCINE  10/21/2016  . PNA vac Low Risk Adult  Completed   Fall Risk  07/22/2016 01/06/2016 08/23/2015 01/10/2014  Falls in the past year? No No No No   Functional Status Survey:    Vitals:   09/11/16 1054  BP: 138/80  Pulse: 78  Resp: 16  Temp: 97.8 F (36.6 C)  SpO2: 95%  Weight: 128 lb 9.6  oz (58.3 kg)   Body mass index is 24.3 kg/m.  Wt Readings from Last 3 Encounters:  09/11/16 128 lb 9.6 oz (58.3 kg)  08/10/16 132 lb 3.2 oz (60 kg)  07/22/16 132 lb 6.4 oz (60.1 kg)   Physical Exam  Constitutional: No distress.  HENT:  Head: Normocephalic and atraumatic.  Nose: Nose normal.  Would not open her mouth for oral exam  Eyes: Conjunctivae are normal. Pupils are equal, round, and reactive to light. Right eye exhibits no discharge. Left eye exhibits no discharge.  Neck: No JVD present.  Cardiovascular: Normal rate and regular rhythm.   No murmur heard. Pulmonary/Chest: Effort normal and breath sounds normal.  Abdominal: Soft. Bowel sounds are normal. She exhibits no distension. There is no tenderness.  Musculoskeletal:  Rigidity to all ext, worse on the right  Lymphadenopathy:    She has no cervical adenopathy.  Neurological:  Non verbal, not able to f/c.  Sleepy today not able to arouse  Skin: Skin is warm and dry. She is not diaphoretic.  Nursing note and vitals reviewed.   Labs reviewed: No results for  input(s): NA, K, CL, CO2, GLUCOSE, BUN, CREATININE, CALCIUM, MG, PHOS in the last 8760 hours. No results for input(s): AST, ALT, ALKPHOS, BILITOT, PROT, ALBUMIN in the last 8760 hours. No results for input(s): WBC, NEUTROABS, HGB, HCT, MCV, PLT in the last 8760 hours. Lab Results  Component Value Date   TSH 2.21 12/18/2014   No results found for: HGBA1C Lab Results  Component Value Date   CHOL 138 03/28/2013   HDL 76 (A) 03/28/2013   LDLCALC 39 03/28/2013   TRIG 113 03/28/2013    Significant Diagnostic Results in last 30 days:  No results found.  Assessment/Plan  1. Parkinson's disease ([place]) Continue current dosing of sinemet per Neurology Progressive weakness, rigidity, dysphagia End of life  2. Dementia due to medical condition without behavioral disturbance No longer able to communicate or follow commands Fast 5 stage dementia Comfort care  3. Essential hypertension Controlled Continue hydralazine  4. Slow transit constipation Controlled Continue Linzess 145 mcg qd  5. Oropharyngeal dysphagia Continue modified diet and asp prec No feeding tube  6. Osteoarthritis, unspecified osteoarthritis type, unspecified site Has not had any signs of pain Prn roxanol not utilized in [employer] documentation   Family/ staff Communication: discussed with caregiver Juliann Pulse and staff  Labs/tests ordered: na

## 2016-09-29 ENCOUNTER — Non-Acute Institutional Stay (SKILLED_NURSING_FACILITY): Payer: Medicare Other | Admitting: Internal Medicine

## 2016-09-29 ENCOUNTER — Encounter: Payer: Self-pay | Admitting: Internal Medicine

## 2016-09-29 DIAGNOSIS — R627 Adult failure to thrive: Secondary | ICD-10-CM | POA: Diagnosis not present

## 2016-09-29 DIAGNOSIS — G20A1 Parkinson's disease without dyskinesia, without mention of fluctuations: Secondary | ICD-10-CM

## 2016-09-29 DIAGNOSIS — I10 Essential (primary) hypertension: Secondary | ICD-10-CM | POA: Diagnosis not present

## 2016-09-29 DIAGNOSIS — F0280 Dementia in other diseases classified elsewhere without behavioral disturbance: Secondary | ICD-10-CM | POA: Diagnosis not present

## 2016-09-29 DIAGNOSIS — G20 Parkinson's disease: Secondary | ICD-10-CM | POA: Diagnosis not present

## 2016-09-29 DIAGNOSIS — K5901 Slow transit constipation: Secondary | ICD-10-CM | POA: Diagnosis not present

## 2016-09-29 DIAGNOSIS — R1312 Dysphagia, oropharyngeal phase: Secondary | ICD-10-CM | POA: Diagnosis not present

## 2016-09-29 NOTE — Progress Notes (Signed)
Patient ID: Vanessa Mcbride, female   DOB: 26-Jan-1925, 83 y.o.   MRN: [id number]  Location:  [facility] Room Number: 132 Place of Service:  SNF ([phone]) Provider:   [name], DO  Patient Care Team: [name], DO as PCP - General (Geriatric Medicine) Community, Well Rosezella Florida, MD as Attending Physician (Neurology) [name], NP as Nurse Practitioner (Neurology)  Extended Emergency Contact Information Primary Emergency Contact: [name],[name] Address: [address]          [place], [place] [address] [name] of [place] Phone: [phone] Mobile Phone: [phone] Relation: Daughter  Code Status:  DNR Goals of care: Advanced Directive information Advanced Directives 09/29/2016  Does Patient Have a Medical Advance Directive? Yes  Type of Advance Directive Out of facility DNR (pink MOST or yellow form);[place];Living will  Does patient want to make changes to medical advance directive? -  Copy of [place] in Chart? Yes  Pre-existing out of facility DNR order (yellow form or pink MOST form) Yellow form placed in chart (order not valid for inpatient use);Pink MOST form placed in chart (order not valid for inpatient use)    Chief Complaint  Patient presents with  . Medical Management of Chronic Issues    routine visit    HPI:  Pt is a 83 y.o. female seen today for medical management of chronic diseases.   She has advanced Parkinson's and dementia, fully dependent in all adls and iadls. .  She is non verbal and not able to follow commands. She has 24/7 caregivers.   Failure to thrive:  Recently, she sleeps almost the entire day. Her weight has decreased by 4 lbs to 128lbs since our last visit. The weights have shown a steady decline due to poor intake. She takes minimal fluid or food.  A few weeks ago, she had a spell of no intake for a few days and was felt to be at end of  life, but then she started to take small amts again.    Dysphagia:  Severe, on pureed diet with honey thick liquids. She continues to cough at meals but has not developed aspiration pneumonia. Staff have difficulty getting her to swallow her pills. Her pill burden was reduced the last time I saw her as her intake has diminished.  We stopped her asa, sinemet, hydralazine and boost.    Parkinson's:  Has rigidity to all ext (right worse than left).  No purposeful movements.  Her right arm contracts up when she is alert and becomes very rigid but relaxes when she is asleep.   HTN: BP no longer being closely monitored as goals of care are comfort based.  Constipation: has used linzess for constipation.  Intake minimal so bms less frequent and has been getting suppositories for bms more often.  Also has prn milk of mag.     Past Medical History:  Diagnosis Date  . Anxiety   . Aortic valve disorders 04/11/2012  . Arthritis    osteoarthritis  . Atrial fibrillation ([place])   . Debility, unspecified 03/18/2012  . Dementia due to medical condition without behavioral disturbance 03/09/2012   04/07/12: ST evaluation revealed deficits in STM and visual spatial tasks. Some tasks not assessed due to language barrier. ST interventions continue.  05/10/2012:  Little progress with cognitive training;  patient always has someone with her to assist with problem solving.Mild, STML compensated with caregivers. Evaluation of  functional status is difficult due to mobility limitation and dependence of caregiving staff.  06/06/12: No change.    . Dementia in conditions classified elsewhere without behavioral disturbance 04/07/2012  . Fracture closed, humerus 03/20/2012  . Generalized anxiety disorder   . Hyperlipidemia   . Hypertension   . Insomnia, unspecified 03/20/2012  . Memory loss 06/09/2012  . Osteoarthrosis, unspecified whether generalized or localized, unspecified site   . Other closed fractures of distal end  of radius (alone) 03/20/2012  . Parkinson's disease ([place])   . Scoliosis   . Transient ischemic attack (TIA), and cerebral infarction without residual deficits(V12.54)   . Unspecified constipation   . Urinary incontinence 07/05/2013   Re: PD   Past Surgical History:  Procedure Laterality Date  . BREAST SURGERY     benign lumpectomy  . CHOLECYSTECTOMY      Allergies  Allergen Reactions  . Iodine Anaphylaxis  . Iohexol      Desc: pt has had contrast in the past (years ago) and it causea anaphylaxis- per pt's daughter.  stephanie davis,rtrct, Onset Date: [id number]     Outpatient Encounter Prescriptions as of 09/29/2016  Medication Sig  . acetaminophen (TYLENOL) 500 MG tablet Take 500 mg by mouth. Take twice daily  . bisacodyl (DULCOLAX) 10 MG suppository Place 10 mg rectally. One suppository, rectal daily as needed if no BM in two days  . cloNIDine (CATAPRES) 0.1 MG tablet Take 0.1 mg by mouth. Take one only if SBP 170 or greater; every 6 hours as needed  . diazepam (DIASTAT ACUDIAL) 10 MG GEL Place 10 mg rectally once. Prn seizures  . linaclotide (LINZESS) 145 MCG CAPS capsule Take 145 mcg by mouth daily before breakfast.  . magnesium hydroxide (MILK OF MAGNESIA) 400 MG/5ML suspension Take 45 mLs by mouth daily as needed for mild constipation.  [place] Kitchen morphine (ROXANOL) 20 MG/ML concentrated solution Take 5 mg by mouth every 4 (four) hours as needed for severe pain.  . [DISCONTINUED] aspirin 81 MG tablet Take 81 mg by mouth daily.  . [DISCONTINUED] carbidopa-levodopa (SINEMET IR) 25-100 MG per tablet Take 1 1/2 tablet 5 times a day, at 6 am, 10 am, 2 pm, 5:15 pm and bedtime.  . [DISCONTINUED] hydrALAZINE (APRESOLINE) 50 MG tablet 50 mg. Take one three times daily hold for systolic <709  . [DISCONTINUED] lactose free nutrition (BOOST) LIQD Take 237 mLs by mouth. Once daily mid-afternoon   No facility-administered encounter medications on file as of 09/29/2016.     Review of Systems    Reason unable to perform ROS: per caregiver and nursing staff.  Constitutional: Positive for activity change, appetite change and fatigue. Negative for chills, fever and unexpected weight change.  Eyes: Negative for visual disturbance.  Respiratory: Negative for shortness of breath.   Cardiovascular: Negative for leg swelling.  Gastrointestinal: Positive for constipation. Negative for abdominal pain, diarrhea, nausea and vomiting.  Genitourinary: Negative for dysuria.  Musculoskeletal: Positive for arthralgias and gait problem.  Neurological: Positive for tremors.       Rigidity  Hematological: Does not bruise/bleed easily.  Psychiatric/Behavioral: Negative for sleep disturbance.       Nonverbal, unable to follow commands    Immunization History  Administered Date(s) Administered  . Influenza Inj Mdck Quad Pf 01/09/2016  . Influenza Whole 12/22/2011, 01/03/2013  . Influenza-Unspecified 12/26/2013, 01/03/2015  . PPD Test 04/21/2012  . Pneumococcal Conjugate-13 07/10/2014  . Pneumococcal Polysaccharide-23 07/28/2016   Pertinent  Health Maintenance Due  Topic Date Due  .  INFLUENZA VACCINE  10/21/2016  . PNA vac Low Risk Adult  Completed   Fall Risk  07/22/2016 01/06/2016 08/23/2015 01/10/2014  Falls in the past year? No No No No   Functional Status Survey:    Vitals:   09/29/16 1411  BP: 140/80  Pulse: 76  Resp: 16  Temp: 98.3 F (36.8 C)  TempSrc: Oral  SpO2: 97%  Weight: 128 lb (58.1 kg)   Body mass index is 24.19 kg/m. Physical Exam  Constitutional: No distress.  Cardiovascular: Normal rate, regular rhythm, normal heart sounds and intact distal pulses.   Pulmonary/Chest: Effort normal and breath sounds normal. No respiratory distress.  Abdominal: Soft. Bowel sounds are normal. She exhibits no distension.  Neurological:  Opens eyes for shorter periods of time now, rigidity of extremities, falls back to sleep quickly, mouth open  Skin: Skin is warm and dry.     Labs reviewed: No results for input(s): NA, K, CL, CO2, GLUCOSE, BUN, CREATININE, CALCIUM, MG, PHOS in the last 8760 hours. No results for input(s): AST, ALT, ALKPHOS, BILITOT, PROT, ALBUMIN in the last 8760 hours. No results for input(s): WBC, NEUTROABS, HGB, HCT, MCV, PLT in the last 8760 hours. Lab Results  Component Value Date   TSH 2.21 12/18/2014   No results found for: HGBA1C Lab Results  Component Value Date   CHOL 138 03/28/2013   HDL 76 (A) 03/28/2013   LDLCALC 39 03/28/2013   TRIG 113 03/28/2013   Assessment/Plan 1. Parkinson disease ([place]) -sinemet has been stopped due to lack of benefit at this stage of her disease  2. Dementia due to medical condition without behavioral disturbance -lacks meaningful interaction with environment, end stages for some time now, intake diminished considerably over past few months with weight loss  3. Oropharyngeal dysphagia -cont modified diet and help with meals, aspiration precautions  4. Slow transit constipation -cont linzess, mom prn and prn suppositories, intake less so less bowel movements expected  5. Essential hypertension -no longer closely monitoring bp, off medication, goals are comfort  6.  Adult failure to thrive -due to end stages of parkinsons and dementia -cont comfort care  Family/ staff Communication: discussed with snf nursing, caregiver  Labs/tests ordered:  No new due to goals of care  Sherill Wegener L. Ziah Leandro, D.O. [facility] [address],  [address] Cell Phone (Mon-Fri 8am-5pm):  [phone] On Call:  [phone] & follow prompts after 5pm & weekends Office Phone:  [phone] Office Fax:  [phone]

## 2016-10-22 ENCOUNTER — Encounter: Payer: Self-pay | Admitting: Adult Health

## 2016-10-22 ENCOUNTER — Non-Acute Institutional Stay (SKILLED_NURSING_FACILITY): Payer: Medicare Other | Admitting: Adult Health

## 2016-10-22 DIAGNOSIS — R627 Adult failure to thrive: Secondary | ICD-10-CM

## 2016-10-22 DIAGNOSIS — R1312 Dysphagia, oropharyngeal phase: Secondary | ICD-10-CM | POA: Diagnosis not present

## 2016-10-22 DIAGNOSIS — G20 Parkinson's disease: Secondary | ICD-10-CM | POA: Diagnosis not present

## 2016-10-22 DIAGNOSIS — N39498 Other specified urinary incontinence: Secondary | ICD-10-CM

## 2016-10-22 NOTE — Progress Notes (Signed)
Location:  Occupational psychologist of Service:  SNF (31) Provider:   [name], ANP [place] [phone]   [name], DO  Patient Care Team: [name], DO as PCP - General (Geriatric Medicine) [facility] Rosezella [place], MD as Attending Physician (Neurology) [name], NP as Nurse Practitioner (Neurology)  Extended Emergency Contact Information Primary Emergency Contact: [facility] Address: [address]          [place], [place] [address] [name] of [place] Phone: [phone] Mobile Phone: [phone] Relation: Daughter  Code Status:  DNR Goals of care: Advanced Directive information Advanced Directives 09/29/2016  Does Patient Have a Medical Advance Directive? Yes  Type of Advance Directive Out of facility DNR (pink MOST or yellow form);[place];Living will  Does patient want to make changes to medical advance directive? -  Copy of [place] in Chart? Yes  Pre-existing out of facility DNR order (yellow form or pink MOST form) Yellow form placed in chart (order not valid for inpatient use);Pink MOST form placed in chart (order not valid for inpatient use)     Chief Complaint  Patient presents with  . Medical Management of Chronic Issues    HPI:  83 y.o. female residing at [facility]. I am here to review her chronic medical issues. She has advanced PD and dementia.  Has had progressive weight loss, decreased oral intake, and periods of lethargy. Her weight is 128 lbs. The staff have reported that she has had some mottling and increased coughing.  Her goals of care are comfort based. Her decline has been slow and steady.    She has severe dysphagia and is on a puree diet with honey thick liquids. She continues to cough at meals.  Functional status: incontinent and hoyer lift  Past Medical History:  Diagnosis Date  . Anxiety     . Aortic valve disorders 04/11/2012  . Arthritis    osteoarthritis  . Atrial fibrillation ([place])   . Debility, unspecified 03/18/2012  . Dementia due to medical condition without behavioral disturbance 03/09/2012   04/07/12: ST evaluation revealed deficits in STM and visual spatial tasks. Some tasks not assessed due to language barrier. ST interventions continue.  05/10/2012:  Little progress with cognitive training;  patient always has someone with her to assist with problem solving.Mild, STML compensated with caregivers. Evaluation of functional status is difficult due to mobility limitation and dependence of caregiving staff.  06/06/12: No change.    . Dementia in conditions classified elsewhere without behavioral disturbance 04/07/2012  . Fracture closed, humerus 03/20/2012  . Generalized anxiety disorder   . Hyperlipidemia   . Hypertension   . Insomnia, unspecified 03/20/2012  . Memory loss 06/09/2012  . Osteoarthrosis, unspecified whether generalized or localized, unspecified site   . Other closed fractures of distal end of radius (alone) 03/20/2012  . Parkinson's disease ([place])   . Scoliosis   . Transient ischemic attack (TIA), and cerebral infarction without residual deficits(V12.54)   . Unspecified constipation   . Urinary incontinence 07/05/2013   Re: PD   Past Surgical History:  Procedure Laterality Date  . BREAST SURGERY     benign lumpectomy  . CHOLECYSTECTOMY      Allergies  Allergen Reactions  . Iodine Anaphylaxis  . Iohexol      Desc: pt has had contrast in the past (years ago) and it causea anaphylaxis- per pt's daughter.  stephanie  davis,rtrct, Onset Date: [id number]     Allergies as of 10/22/2016      Reactions   Iodine Anaphylaxis   Iohexol     Desc: pt has had contrast in the past (years ago) and it causea anaphylaxis- per pt's daughter.  stephanie davis,rtrct, Onset Date: [id number]      Medication List       Accurate as of 10/22/16 11:02 AM. Always use your  most recent med list.          acetaminophen 500 MG tablet Commonly known as:  TYLENOL Take 500 mg by mouth. Take twice daily   bisacodyl 10 MG suppository Commonly known as:  DULCOLAX Place 10 mg rectally. One suppository, rectal daily as needed if no BM in two days   cloNIDine 0.1 MG tablet Commonly known as:  CATAPRES Take 0.1 mg by mouth. Take one only if SBP 170 or greater; every 6 hours as needed   diazepam 10 MG Gel Commonly known as:  DIASTAT ACUDIAL Place 10 mg rectally once. Prn seizures   LINZESS 145 MCG Caps capsule Generic drug:  linaclotide Take 145 mcg by mouth daily before breakfast.   magnesium hydroxide 400 MG/5ML suspension Commonly known as:  MILK OF MAGNESIA Take 45 mLs by mouth daily as needed for mild constipation.   morphine 20 MG/ML concentrated solution Commonly known as:  ROXANOL Take 5 mg by mouth every 4 (four) hours as needed for severe pain.       Review of Systems  Unable to perform ROS: Dementia    Immunization History  Administered Date(s) Administered  . Influenza Inj Mdck Quad Pf 01/09/2016  . Influenza Whole 12/22/2011, 01/03/2013  . Influenza-Unspecified 12/26/2013, 01/03/2015  . PPD Test 04/21/2012  . Pneumococcal Conjugate-13 07/10/2014  . Pneumococcal Polysaccharide-23 07/28/2016   Pertinent  Health Maintenance Due  Topic Date Due  . INFLUENZA VACCINE  10/21/2016  . PNA vac Low Risk Adult  Completed   Fall Risk  07/22/2016 01/06/2016 08/23/2015 01/10/2014  Falls in the past year? No No No No   Functional Status Survey:    Vitals:   10/22/16 1101  Weight: 128 lb 6.4 oz (58.2 kg)   Body mass index is 24.26 kg/m.  Wt Readings from Last 3 Encounters:  10/22/16 128 lb 6.4 oz (58.2 kg)  09/29/16 128 lb (58.1 kg)  09/11/16 128 lb 9.6 oz (58.3 kg)   Physical Exam  Constitutional: No distress.  HENT:  Head: Normocephalic and atraumatic.  Nose: Nose normal.  Mouth/Throat: No oropharyngeal exudate.  Eyes: Pupils  are equal, round, and reactive to light. Conjunctivae are normal. Right eye exhibits no discharge. Left eye exhibits no discharge.  Neck: No JVD present.  Cardiovascular: Normal rate and regular rhythm.   No murmur heard. Pulmonary/Chest: Effort normal and breath sounds normal.  Abdominal: Soft. Bowel sounds are normal. She exhibits no distension. There is no tenderness.  Musculoskeletal:  Rigidity to all ext, worse on the right  Lymphadenopathy:    She has no cervical adenopathy.  Neurological:  Non verbal, not able to f/c.  Sleepy today not able to arouse  Skin: Skin is warm and dry. She is not diaphoretic.  Small amt of mottling to knees and toes  Nursing note and vitals reviewed.   Labs reviewed: No results for input(s): NA, K, CL, CO2, GLUCOSE, BUN, CREATININE, CALCIUM, MG, PHOS in the last 8760 hours. No results for input(s): AST, ALT, ALKPHOS, BILITOT, PROT, ALBUMIN in the last 8760 hours. No  results for input(s): WBC, NEUTROABS, HGB, HCT, MCV, PLT in the last 8760 hours. Lab Results  Component Value Date   TSH 2.21 12/18/2014   No results found for: HGBA1C Lab Results  Component Value Date   CHOL 138 03/28/2013   HDL 76 (A) 03/28/2013   LDLCALC 39 03/28/2013   TRIG 113 03/28/2013    Significant Diagnostic Results in last 30 days:  No results found.  Assessment/Plan  1. FTT (failure to thrive) in adult Progressive weight loss and cognitive decline Takes in very little oral fluids or food No longer receives weight monitoring due to goals of care Has signs of nearing the dying process with mottling to knees and toes May use roxanol ordered prn for sob or pain  2. Parkinson's disease ([place]) With associated dementia No longer on medications  3. Oropharyngeal dysphagia Continue modified and asp prec only if resident is fully awake and can safely consumes oral fluids/food  4. Other urinary incontinence Due to PD and dementia Keep clean and dry    Family/  staff Communication: discussed with caregiver Juliann Pulse and staff  Labs/tests ordered: na

## 2016-11-21 DEATH — deceased
